# Patient Record
Sex: Male | Born: 1960 | Race: Black or African American | Hispanic: No | Marital: Single | Smoking: Current every day smoker
Health system: Southern US, Community
[De-identification: ages and names within clinical notes are randomized; demographics above are authoritative.]

## PROBLEM LIST (undated history)

## (undated) DIAGNOSIS — K219 Gastro-esophageal reflux disease without esophagitis: Secondary | ICD-10-CM

## (undated) DIAGNOSIS — E785 Hyperlipidemia, unspecified: Secondary | ICD-10-CM

## (undated) DIAGNOSIS — I10 Essential (primary) hypertension: Secondary | ICD-10-CM

## (undated) DIAGNOSIS — J449 Chronic obstructive pulmonary disease, unspecified: Secondary | ICD-10-CM

## (undated) DIAGNOSIS — F17200 Nicotine dependence, unspecified, uncomplicated: Secondary | ICD-10-CM

## (undated) DIAGNOSIS — I219 Acute myocardial infarction, unspecified: Secondary | ICD-10-CM

## (undated) DIAGNOSIS — R0600 Dyspnea, unspecified: Secondary | ICD-10-CM

## (undated) DIAGNOSIS — I2510 Atherosclerotic heart disease of native coronary artery without angina pectoris: Secondary | ICD-10-CM

## (undated) HISTORY — DX: Essential (primary) hypertension: I10

## (undated) HISTORY — DX: Hyperlipidemia, unspecified: E78.5

## (undated) HISTORY — PX: CORONARY STENT PLACEMENT: SHX1402

## (undated) HISTORY — PX: CARDIAC CATHETERIZATION: SHX172

## (undated) HISTORY — DX: Acute myocardial infarction, unspecified: I21.9

## (undated) HISTORY — PX: FOOT SURGERY: SHX648

---

## 2009-10-15 ENCOUNTER — Inpatient Hospital Stay: Payer: Self-pay | Admitting: Internal Medicine

## 2009-12-06 ENCOUNTER — Ambulatory Visit: Payer: Self-pay | Admitting: Cardiology

## 2010-05-22 ENCOUNTER — Inpatient Hospital Stay: Payer: Self-pay | Admitting: Specialist

## 2013-10-20 DIAGNOSIS — F1090 Alcohol use, unspecified, uncomplicated: Secondary | ICD-10-CM | POA: Insufficient documentation

## 2013-10-20 DIAGNOSIS — I10 Essential (primary) hypertension: Secondary | ICD-10-CM | POA: Insufficient documentation

## 2013-10-20 DIAGNOSIS — F1010 Alcohol abuse, uncomplicated: Secondary | ICD-10-CM | POA: Insufficient documentation

## 2013-10-20 DIAGNOSIS — Z720 Tobacco use: Secondary | ICD-10-CM | POA: Insufficient documentation

## 2013-10-20 DIAGNOSIS — F17200 Nicotine dependence, unspecified, uncomplicated: Secondary | ICD-10-CM | POA: Insufficient documentation

## 2017-06-24 DIAGNOSIS — L723 Sebaceous cyst: Secondary | ICD-10-CM | POA: Diagnosis not present

## 2017-06-30 ENCOUNTER — Encounter: Payer: Self-pay | Admitting: *Deleted

## 2017-07-01 ENCOUNTER — Ambulatory Visit: Payer: Self-pay | Admitting: General Surgery

## 2017-07-02 ENCOUNTER — Encounter: Payer: Self-pay | Admitting: *Deleted

## 2017-07-03 ENCOUNTER — Encounter: Payer: Self-pay | Admitting: *Deleted

## 2017-07-15 ENCOUNTER — Ambulatory Visit: Payer: Self-pay | Admitting: General Surgery

## 2017-07-21 ENCOUNTER — Ambulatory Visit: Payer: Medicare HMO | Admitting: General Surgery

## 2017-07-21 ENCOUNTER — Encounter: Payer: Self-pay | Admitting: General Surgery

## 2017-07-21 VITALS — BP 124/68 | HR 76 | Resp 12 | Ht 69.0 in | Wt 125.0 lb

## 2017-07-21 DIAGNOSIS — L729 Follicular cyst of the skin and subcutaneous tissue, unspecified: Secondary | ICD-10-CM

## 2017-07-21 NOTE — Progress Notes (Signed)
Patient ID: Iona CoachManuel C Lisle, male   DOB: 08/19/1960, 56 y.o.   MRN: [id number]  Chief Complaint  Patient presents with  . Other    cyst on back    HPI Iona CoachManuel C Krauss is a 56 y.o. male here today for a evaluation of a cyst on upper back. Patient noticed this area about 5 years ago. The area has drained and he was seen at Urgent Care on 06/24/17 for the draining cyst. He still has a large area in the same spot.  HPI  Past Medical History:  Diagnosis Date  . Heart attack (HCC)   . Hyperlipidemia   . Hypertension     Past Surgical History:  Procedure Laterality Date  . CORONARY STENT PLACEMENT      History reviewed. No pertinent family history.  Social History Social History   Tobacco Use  . Smoking status: Current Every Day Smoker  . Smokeless tobacco: Never Used  Substance Use Topics  . Alcohol use: Yes    Frequency: Never  . Drug use: No    No Known Allergies  Current Outpatient Medications  Medication Sig Dispense Refill  . albuterol (PROVENTIL HFA) 108 (90 Base) MCG/ACT inhaler Inhale into the lungs.    [name]. amLODipine (NORVASC) 10 MG tablet TAKE ONE TABLET BY MOUTH ONCE DAILY    . aspirin EC 81 MG tablet Take 81 mg by mouth daily.    [name]. atorvastatin (LIPITOR) 20 MG tablet Take 20 mg by mouth daily.    . cetirizine (ZYRTEC) 10 MG tablet Take by mouth.    . fluticasone (FLONASE) 50 MCG/ACT nasal spray Place into the nose.    [name]. lisinopril (PRINIVIL,ZESTRIL) 20 MG tablet Take by mouth.    . metoprolol succinate (TOPROL-XL) 50 MG 24 hr tablet Take 50 mg by mouth daily. Take with or immediately following a meal.    . nitroGLYCERIN (NITROSTAT) 0.4 MG SL tablet Place 0.4 mg under the tongue every 5 (five) minutes as needed for chest pain.    . Omega-3 Fatty Acids (FISH OIL) 1200 MG CAPS Take by mouth.    . simvastatin (ZOCOR) 20 MG tablet Take 20 mg by mouth daily.     No current facility-administered medications for this visit.     Review of Systems Review of Systems    Constitutional: Negative.   Respiratory: Negative.   Cardiovascular: Negative.     Blood pressure 124/68, pulse 76, resp. rate 12, height 5\' 9"  (1.753 m), weight 125 lb (56.7 kg).  Physical Exam Physical Exam  Constitutional: He is oriented to person, place, and time. He appears well-developed and well-nourished.  Eyes: Conjunctivae are normal. No scleral icterus.  Neck: Neck supple.  Cardiovascular: Normal rate, regular rhythm and normal heart sounds.  Pulmonary/Chest: Effort normal and breath sounds normal.  Lymphadenopathy:    He has no cervical adenopathy.    He has no axillary adenopathy.  Neurological: He is alert and oriented to person, place, and time.  Skin: Skin is warm and dry.     Psychiatric: He has a normal mood and affect.    Data Reviewed Notes reviewed   Assessment    3 cm cyst that appears to have drained on the left edge with a scab over it. No sign of active infection    Plan    Patient advised to have cyst removed in outpatient surgery. May require mobilization of skin on both sides with intermediate closure. Explained fully and pt is agreeable.  HPI, Physical Exam, Assessment and Plan have been scribed under the direction and in the presence of Kathreen CosierS. G. Corretta Munce, MD  Carron Brazenaryl-Lyn Kennedy, LPN  I have completed the exam and reviewed the above documentation for accuracy and completeness.  I agree with the above.  Museum/gallery conserv[employer]agon [employer] has been used and any errors in dictation or transcription are unintentional.  Scotty Pinder G. Evette CristalSankar, M.D., F.A.C.S.  Gerlene BurdockSANKAR,Kammy Klett G 07/21/2017, 2:06 PM  Patient's [facility]. It is okay for patient to continue an 81 mg aspirin once daily.   Nicholes MangoMichele J. Bailey, CMA

## 2017-07-22 ENCOUNTER — Other Ambulatory Visit: Payer: Self-pay

## 2017-07-22 ENCOUNTER — Telehealth: Payer: Self-pay

## 2017-07-22 ENCOUNTER — Encounter
Admission: RE | Admit: 2017-07-22 | Discharge: 2017-07-22 | Disposition: A | Discharge status: Home / Self Care | Payer: Medicare HMO | Source: Ambulatory Visit | Attending: General Surgery | Admitting: General Surgery

## 2017-07-22 DIAGNOSIS — I10 Essential (primary) hypertension: Secondary | ICD-10-CM | POA: Insufficient documentation

## 2017-07-22 DIAGNOSIS — L729 Follicular cyst of the skin and subcutaneous tissue, unspecified: Secondary | ICD-10-CM | POA: Insufficient documentation

## 2017-07-22 DIAGNOSIS — E785 Hyperlipidemia, unspecified: Secondary | ICD-10-CM | POA: Diagnosis not present

## 2017-07-22 DIAGNOSIS — Z538 Procedure and treatment not carried out for other reasons: Secondary | ICD-10-CM | POA: Diagnosis not present

## 2017-07-22 DIAGNOSIS — F17200 Nicotine dependence, unspecified, uncomplicated: Secondary | ICD-10-CM | POA: Diagnosis not present

## 2017-07-22 DIAGNOSIS — R0600 Dyspnea, unspecified: Secondary | ICD-10-CM | POA: Diagnosis not present

## 2017-07-22 DIAGNOSIS — I491 Atrial premature depolarization: Secondary | ICD-10-CM

## 2017-07-22 DIAGNOSIS — I252 Old myocardial infarction: Secondary | ICD-10-CM | POA: Diagnosis not present

## 2017-07-22 DIAGNOSIS — I447 Left bundle-branch block, unspecified: Secondary | ICD-10-CM | POA: Insufficient documentation

## 2017-07-22 DIAGNOSIS — K219 Gastro-esophageal reflux disease without esophagitis: Secondary | ICD-10-CM | POA: Diagnosis not present

## 2017-07-22 DIAGNOSIS — E876 Hypokalemia: Secondary | ICD-10-CM | POA: Diagnosis not present

## 2017-07-22 DIAGNOSIS — I2510 Atherosclerotic heart disease of native coronary artery without angina pectoris: Secondary | ICD-10-CM | POA: Diagnosis not present

## 2017-07-22 DIAGNOSIS — Z01818 Encounter for other preprocedural examination: Secondary | ICD-10-CM | POA: Insufficient documentation

## 2017-07-22 HISTORY — DX: Dyspnea, unspecified: R06.00

## 2017-07-22 HISTORY — DX: Gastro-esophageal reflux disease without esophagitis: K21.9

## 2017-07-22 HISTORY — DX: Nicotine dependence, unspecified, uncomplicated: F17.200

## 2017-07-22 HISTORY — DX: Atherosclerotic heart disease of native coronary artery without angina pectoris: I25.10

## 2017-07-22 LAB — CBC
HCT: 32.3 % — ABNORMAL LOW (ref 40.0–52.0)
Hemoglobin: 11.1 g/dL — ABNORMAL LOW (ref 13.0–18.0)
MCH: 31.9 pg (ref 26.0–34.0)
MCHC: 34.5 g/dL (ref 32.0–36.0)
MCV: 92.5 fL (ref 80.0–100.0)
PLATELETS: 217 10*3/uL (ref 150–440)
RBC: 3.49 MIL/uL — ABNORMAL LOW (ref 4.40–5.90)
RDW: 13.3 % (ref 11.5–14.5)
WBC: 8.1 10*3/uL (ref 3.8–10.6)

## 2017-07-22 LAB — DIFFERENTIAL
BASOS ABS: 0.1 10*3/uL (ref 0–0.1)
BASOS PCT: 1 %
EOS ABS: 0 10*3/uL (ref 0–0.7)
Eosinophils Relative: 0 %
Lymphocytes Relative: 22 %
Lymphs Abs: 1.8 10*3/uL (ref 1.0–3.6)
MONO ABS: 0.9 10*3/uL (ref 0.2–1.0)
MONOS PCT: 11 %
NEUTROS ABS: 5.3 10*3/uL (ref 1.4–6.5)
Neutrophils Relative %: 66 %

## 2017-07-22 LAB — BASIC METABOLIC PANEL
Anion gap: 13 (ref 5–15)
BUN: 5 mg/dL — AB (ref 6–20)
CALCIUM: 8.7 mg/dL — AB (ref 8.9–10.3)
CO2: 30 mmol/L (ref 22–32)
Chloride: 93 mmol/L — ABNORMAL LOW (ref 101–111)
Creatinine, Ser: 0.59 mg/dL — ABNORMAL LOW (ref 0.61–1.24)
GFR calc Af Amer: >60 mL/min (ref >60)
GLUCOSE: 112 mg/dL — AB (ref 65–99)
POTASSIUM: 2.6 mmol/L — AB (ref 3.5–5.1)
SODIUM: 136 mmol/L (ref 135–145)

## 2017-07-22 NOTE — Pre-Procedure Instructions (Signed)
Dr. Henrene HawkingKephart made aware of pre-op EKG and stated okay for surgery.

## 2017-07-22 NOTE — Patient Instructions (Signed)
Your procedure is scheduled on: July 25, 2017 (Friday ) Report to Same Day Surgery. ([facility] ) SECOND FLOOR To find out your arrival time please call [phone] between 1PM - 3PM on July 24, 2017 (Thursday ).  Remember: Instructions that are not followed completely may result in serious medical risk, up to and including death, or upon the discretion of your surgeon and anesthesiologist your surgery may need to be rescheduled.     _X__ 1. Do not eat food after midnight the night before your procedure.                 No gum chewing or hard candies. You may drink clear liquids up to 2 hours                 before you are scheduled to arrive for your surgery- DO not drink clear                 liquids within 2 hours of the start of your surgery.                 Clear Liquids include:  water, apple juice without pulp, clear carbohydrate                 drink such as Clearfast of Gartorade, Black Coffee or Tea (Do not add                 anything to coffee or tea).     _X__ 2.  No Alcohol for 24 hours before or after surgery.   _X__ 3.  Do Not Smoke or use e-cigarettes For 24 Hours Prior to Your Surgery.                 Do not use any chewable tobacco products for at least 6 hours prior to                 surgery.  ____  4.  Bring all medications with you on the day of surgery if instructed.   _X___  5.  Notify your doctor if there is any change in your medical condition      (cold, fever, infections).     Do not wear jewelry, make-up, hairpins, clips or nail polish. Do not wear lotions, powders, or perfumes.  Do not shave 48 hours prior to surgery. Men may shave face and neck. Do not bring valuables to the hospital.    [facility] is not responsible for any belongings or valuables.  Contacts, dentures or bridgework may not be worn into surgery. Leave your suitcase in the car. After surgery it may be brought to your room. For patients admitted to  the hospital, discharge time is determined by your treatment team.   Patients discharged the day of surgery will not be allowed to drive home.   Please read over the following fact sheets that you were given:             __X__ Take these medicines the morning of surgery with A SIP OF WATER:    1. AMLODIPINE  2. METOPROLOL  3. OMEPRAZOLE  4. OMEPRAZOLE AT BEDTIME ON THURSDAY NIGHT  5.  6.  ____ Fleet Enema (as directed)   __X__ Use CHG Soap as directed  __X__ Use inhalers on the day of surgery (USE PRO AIR INHALER THE MORNING OF SURGERY AND BRING TO HOSPITAL   ____ Stop metformin 2 days prior to surgery  ____ Take 1/2 of usual insulin dose the night before surgery. No insulin the morning          of surgery.   _X___ Stop Coumadin/Plavix/aspirin on (DO NOT TAKE ASPIRIN THE MORNING OF SURGERY )  _X___ Stop Anti-inflammatories on ( NO ASPIRIN PRODUCTS ) TYLENOL OK TO TAKE FOR PAIN IF NEEDED   _X ___ Stop supplements until after surgery. (STOP FISH OIL UNTIL AFTER SURGERY )   ____ Bring C-Pap to the hospital.

## 2017-07-22 NOTE — Telephone Encounter (Signed)
Bernard Harris with [facility] pre admit testing called and said the patient's potassium level was 2.6 today.  Spoke with Dr Evette CristalSankar and received orders to sent in for Potassium 20 MEQ one twice daily until surgery. They will recheck his levels the morning of surgery.  Patient notified and will start this today. Prescription has been sent into his pharmacy.

## 2017-07-22 NOTE — Pre-Procedure Instructions (Signed)
Potassium results faxed and called to Dr. Evette CristalSankar office (spoke to PublixCaryllyn Kennedy ).

## 2017-07-24 MED ORDER — CEFAZOLIN SODIUM-DEXTROSE 2-4 GM/100ML-% IV SOLN: 2.0000 g | INTRAVENOUS | Status: AC

## 2017-07-25 ENCOUNTER — Ambulatory Visit
Admission: RE | Admit: 2017-07-25 | Discharge: 2017-07-25 | Disposition: A | Discharge status: Home / Self Care | Payer: Medicare HMO | Source: Ambulatory Visit | Attending: General Surgery | Admitting: General Surgery

## 2017-07-25 ENCOUNTER — Encounter: Payer: Self-pay | Admitting: *Deleted

## 2017-07-25 ENCOUNTER — Other Ambulatory Visit: Payer: Self-pay | Admitting: *Deleted

## 2017-07-25 ENCOUNTER — Ambulatory Visit: Payer: Medicare HMO | Admitting: Anesthesiology

## 2017-07-25 ENCOUNTER — Other Ambulatory Visit: Payer: Self-pay | Admitting: General Surgery

## 2017-07-25 ENCOUNTER — Encounter
Admission: RE | Disposition: A | Discharge status: Home / Self Care | Payer: Self-pay | Source: Ambulatory Visit | Attending: General Surgery

## 2017-07-25 DIAGNOSIS — I252 Old myocardial infarction: Secondary | ICD-10-CM | POA: Diagnosis not present

## 2017-07-25 DIAGNOSIS — I2510 Atherosclerotic heart disease of native coronary artery without angina pectoris: Secondary | ICD-10-CM | POA: Insufficient documentation

## 2017-07-25 DIAGNOSIS — L729 Follicular cyst of the skin and subcutaneous tissue, unspecified: Secondary | ICD-10-CM | POA: Insufficient documentation

## 2017-07-25 DIAGNOSIS — R0600 Dyspnea, unspecified: Secondary | ICD-10-CM | POA: Diagnosis not present

## 2017-07-25 DIAGNOSIS — Z538 Procedure and treatment not carried out for other reasons: Secondary | ICD-10-CM | POA: Insufficient documentation

## 2017-07-25 DIAGNOSIS — I10 Essential (primary) hypertension: Secondary | ICD-10-CM | POA: Insufficient documentation

## 2017-07-25 DIAGNOSIS — E785 Hyperlipidemia, unspecified: Secondary | ICD-10-CM | POA: Insufficient documentation

## 2017-07-25 DIAGNOSIS — K219 Gastro-esophageal reflux disease without esophagitis: Secondary | ICD-10-CM | POA: Diagnosis not present

## 2017-07-25 DIAGNOSIS — E876 Hypokalemia: Secondary | ICD-10-CM | POA: Insufficient documentation

## 2017-07-25 DIAGNOSIS — F17200 Nicotine dependence, unspecified, uncomplicated: Secondary | ICD-10-CM | POA: Diagnosis not present

## 2017-07-25 LAB — POTASSIUM: POTASSIUM: 2.6 mmol/L — AB (ref 3.5–5.1)

## 2017-07-25 LAB — POCT I-STAT 4, (NA,K, GLUC, HGB,HCT)
GLUCOSE: 107 mg/dL — AB (ref 65–99)
HEMATOCRIT: 31 % — AB (ref 39.0–52.0)
Hemoglobin: 10.5 g/dL — ABNORMAL LOW (ref 13.0–17.0)
Potassium: 2.5 mmol/L — CL (ref 3.5–5.1)
Sodium: 141 mmol/L (ref 135–145)

## 2017-07-25 SURGERY — EXCISION, LESION, BACK
Anesthesia: General

## 2017-07-25 MED ORDER — CEFAZOLIN SODIUM-DEXTROSE 2-4 GM/100ML-% IV SOLN
INTRAVENOUS | Status: AC
  Filled 2017-07-25: qty 100

## 2017-07-25 MED ORDER — LACTATED RINGERS IV SOLN
INTRAVENOUS | Status: DC
  Administered 2017-07-25: 07:00:00 via INTRAVENOUS

## 2017-07-25 MED ORDER — MIDAZOLAM HCL 2 MG/2ML IJ SOLN
INTRAMUSCULAR | Status: AC
  Filled 2017-07-25: qty 2

## 2017-07-25 MED ORDER — ROCURONIUM BROMIDE 50 MG/5ML IV SOLN
INTRAVENOUS | Status: AC
  Filled 2017-07-25: qty 1

## 2017-07-25 MED ORDER — CHLORHEXIDINE GLUCONATE CLOTH 2 % EX PADS: 6.0000 | MEDICATED_PAD | Freq: Once | CUTANEOUS | Status: DC

## 2017-07-25 MED ORDER — SUCCINYLCHOLINE CHLORIDE 20 MG/ML IJ SOLN
INTRAMUSCULAR | Status: AC
  Filled 2017-07-25: qty 1

## 2017-07-25 MED ORDER — LIDOCAINE HCL (PF) 2 % IJ SOLN
INTRAMUSCULAR | Status: AC
  Filled 2017-07-25: qty 10

## 2017-07-25 MED ORDER — FENTANYL CITRATE (PF) 100 MCG/2ML IJ SOLN
INTRAMUSCULAR | Status: AC
  Filled 2017-07-25: qty 2

## 2017-07-25 MED ORDER — PROPOFOL 10 MG/ML IV BOLUS
INTRAVENOUS | Status: AC
  Filled 2017-07-25: qty 20

## 2017-07-25 MED ORDER — BUPIVACAINE HCL (PF) 0.5 % IJ SOLN
INTRAMUSCULAR | Status: AC
  Filled 2017-07-25: qty 30

## 2017-07-25 SURGICAL SUPPLY — 28 items
CANISTER SUCT 1200ML W/VALVE (MISCELLANEOUS) ×3 IMPLANT
CHLORAPREP W/TINT 26ML (MISCELLANEOUS) ×3 IMPLANT
DERMABOND ADVANCED (GAUZE/BANDAGES/DRESSINGS) ×2
DERMABOND ADVANCED .7 DNX12 (GAUZE/BANDAGES/DRESSINGS) ×1 IMPLANT
DRAPE LAPAROTOMY 100X77 ABD (DRAPES) ×3 IMPLANT
DRSG TEGADERM 4X4.75 (GAUZE/BANDAGES/DRESSINGS) ×3 IMPLANT
DRSG TELFA 4X3 1S NADH ST (GAUZE/BANDAGES/DRESSINGS) ×3 IMPLANT
ELECT REM PT RETURN 9FT ADLT (ELECTROSURGICAL) ×3
ELECTRODE REM PT RTRN 9FT ADLT (ELECTROSURGICAL) ×1 IMPLANT
GAUZE SPONGE 4X4 12PLY STRL (GAUZE/BANDAGES/DRESSINGS) ×3 IMPLANT
GLOVE BIO SURGEON STRL SZ7 (GLOVE) ×3 IMPLANT
GOWN STRL REUS W/ TWL LRG LVL3 (GOWN DISPOSABLE) ×2 IMPLANT
GOWN STRL REUS W/TWL LRG LVL3 (GOWN DISPOSABLE) ×4
KIT RM TURNOVER STRD PROC AR (KITS) ×3 IMPLANT
LABEL OR SOLS (LABEL) ×3 IMPLANT
NEEDLE HYPO 22GX1.5 SAFETY (NEEDLE) ×3 IMPLANT
NEEDLE HYPO 25X1 1.5 SAFETY (NEEDLE) ×3 IMPLANT
PACK BASIN MINOR ARMC (MISCELLANEOUS) ×3 IMPLANT
SUT ETHILON 4-0 (SUTURE)
SUT ETHILON 4-0 FS2 18XMFL BLK (SUTURE)
SUT VIC AB 2-0 CT1 (SUTURE) ×3 IMPLANT
SUT VIC AB 3-0 SH 27 (SUTURE) ×2
SUT VIC AB 3-0 SH 27X BRD (SUTURE) ×1 IMPLANT
SUT VIC AB 4-0 FS2 27 (SUTURE) ×3 IMPLANT
SUT VICRYL+ 3-0 144IN (SUTURE) IMPLANT
SUTURE ETHLN 4-0 FS2 18XMF BLK (SUTURE) IMPLANT
SYR BULB IRRIG 60ML STRL (SYRINGE) ×3 IMPLANT
SYR CONTROL 10ML (SYRINGE) ×3 IMPLANT

## 2017-07-25 NOTE — Telephone Encounter (Signed)
patient's potassium level was low. Surgery cancelled.  Spoke with Dr Evette CristalSankar and received orders to sent in for Potassium 20 MEQ one twice daily . Rx called in.

## 2017-07-25 NOTE — Anesthesia Preprocedure Evaluation (Signed)
Anesthesia Evaluation  Patient identified by MRN, date of birth, ID band Patient awake    Reviewed: Allergy & Precautions, NPO status , Patient's Chart, lab work & pertinent test results  History of Anesthesia Complications Negative for: history of anesthetic complications  Airway Mallampati: III  TM Distance: >3 FB Neck ROM: Full    Dental  (+) Poor Dentition   Pulmonary neg COPD, Current Smoker,    breath sounds clear to auscultation- rhonchi (-) wheezing      Cardiovascular hypertension, + CAD and + Past MI  (-) Cardiac Stents and (-) CABG  Rhythm:Regular Rate:Normal - Systolic murmurs and - Diastolic murmurs    Neuro/Psych negative neurological ROS  negative psych ROS   GI/Hepatic Neg liver ROS, GERD  ,  Endo/Other  negative endocrine ROSneg diabetes  Renal/GU negative Renal ROS     Musculoskeletal negative musculoskeletal ROS (+)   Abdominal (+) - obese,   Peds  Hematology negative hematology ROS (+)   Anesthesia Other Findings Past Medical History: No date: Coronary artery disease No date: Current every day smoker No date: Dyspnea     Comment:  with exertion No date: GERD (gastroesophageal reflux disease) No date: Heart attack (HCC)     Comment:  X 2 No date: Hyperlipidemia No date: Hypertension   Reproductive/Obstetrics                             Anesthesia Physical Anesthesia Plan  ASA: III  Anesthesia Plan: General   Post-op Pain Management:    Induction: Intravenous  PONV Risk Score and Plan: 0 and Ondansetron  Airway Management Planned: Oral ETT  Additional Equipment:   Intra-op Plan:   Post-operative Plan: Extubation in OR  Informed Consent: I have reviewed the patients History and Physical, chart, labs and discussed the procedure including the risks, benefits and alternatives for the proposed anesthesia with the patient or authorized representative who  has indicated his/her understanding and acceptance.   Dental advisory given  Plan Discussed with: CRNA and Anesthesiologist  Anesthesia Plan Comments:         Anesthesia Quick Evaluation

## 2017-07-25 NOTE — H&P (Signed)
  Pt had a low K of 2.5 on preadmit testing. He has been taking KCL for last 3 days K this am is still lw-2.6. Cyst on his back is not inflamed  Feel it would be safer to wait and correct his potassium level. Pt advised. Will contact PCP to address this issue. Surgery will be rescheduled. Pt advised.

## 2017-07-25 NOTE — OR Nursing (Signed)
IV discontinued and patient discharged home.  Aware to pick up RX for potassium.

## 2017-07-31 ENCOUNTER — Telehealth: Payer: Self-pay | Admitting: *Deleted

## 2017-07-31 NOTE — Telephone Encounter (Signed)
Patient was contacted today and notified of appointment with Dr. Terance [facility]Bronstein at [facility] in [place] for Monday, 08-04-17 at 9:15 am (arrive 9 am). The patient will need to take his insurance card and photo ID.   The patient verbalizes understanding.   He was again reminded to continue potassium tablets twice daily.

## 2017-07-31 NOTE — Telephone Encounter (Signed)
Patient was contacted today and notified that Dr. Mcarthur RossettiBronstein's office will be in contact directly with him to get him scheduled for an appointment within the next couple of weeks.   Dr. Evette CristalSankar spoke with Dr. Terance HartBronstein today.  Patient is to continue taking potassium chloride 20 meq one tablet twice daily for 2 weeks. The patient states that he just picked up a prescription and has enough to last for 2 weeks out.   Once Dr. Terance HartBronstein has cleared patient for surgery and potassium level is corrected, we will make arrangements for patient to see Dr. Lemar LivingsByrnett and get surgery rescheduled at that time.   Patient verbalizes understanding.

## 2017-08-04 DIAGNOSIS — F1010 Alcohol abuse, uncomplicated: Secondary | ICD-10-CM | POA: Diagnosis not present

## 2017-08-04 DIAGNOSIS — E7800 Pure hypercholesterolemia, unspecified: Secondary | ICD-10-CM | POA: Diagnosis not present

## 2017-08-04 DIAGNOSIS — I10 Essential (primary) hypertension: Secondary | ICD-10-CM | POA: Diagnosis not present

## 2017-08-04 DIAGNOSIS — E876 Hypokalemia: Secondary | ICD-10-CM | POA: Diagnosis not present

## 2017-08-04 DIAGNOSIS — F17210 Nicotine dependence, cigarettes, uncomplicated: Secondary | ICD-10-CM | POA: Diagnosis not present

## 2017-08-04 DIAGNOSIS — E785 Hyperlipidemia, unspecified: Secondary | ICD-10-CM | POA: Diagnosis not present

## 2017-08-04 DIAGNOSIS — Z125 Encounter for screening for malignant neoplasm of prostate: Secondary | ICD-10-CM | POA: Diagnosis not present

## 2017-08-04 DIAGNOSIS — Z720 Tobacco use: Secondary | ICD-10-CM | POA: Diagnosis not present

## 2017-08-11 ENCOUNTER — Encounter: Payer: Self-pay | Admitting: *Deleted

## 2017-08-11 NOTE — Progress Notes (Signed)
We received a note from Dr. Mcarthur RossettiBronstein's office dated 08-08-17 stating that patient's potassium is now normal and we can proceed with surgery.   Patient was contacted today and notified of the above. A pre-op visit has been scheduled for 08-20-17 with Dr. Lemar LivingsByrnett. Surgery date will be arranged at that office visit.

## 2017-08-20 ENCOUNTER — Encounter: Payer: Self-pay | Admitting: General Surgery

## 2017-08-20 ENCOUNTER — Ambulatory Visit (INDEPENDENT_AMBULATORY_CARE_PROVIDER_SITE_OTHER): Payer: Medicare HMO | Admitting: General Surgery

## 2017-08-20 VITALS — BP 118/78 | HR 76 | Resp 14 | Ht 69.0 in | Wt 127.0 lb

## 2017-08-20 DIAGNOSIS — L729 Follicular cyst of the skin and subcutaneous tissue, unspecified: Secondary | ICD-10-CM | POA: Diagnosis not present

## 2017-08-20 NOTE — Progress Notes (Signed)
Patient ID: Bernard Harris, male   DOB: [birth year], 56 y.o.   MRN: [id number]  Chief Complaint  Patient presents with  . Other    HPI Bernard Harris is a 57 y.o. male.  Here today to discuss surgery. He was seen by Dr Evette Cristal for a cyst on upper back. Patient noticed this area about 5 years ago. The area has not drained recently. He was seen at Urgent Care on 06/24/17 for the draining cyst. He states the area is itching now. He states he ran out of his potassium was today.  HPI  Past Medical History:  Diagnosis Date  . Coronary artery disease   . Current every day smoker   . Dyspnea    with exertion  . GERD (gastroesophageal reflux disease)   . Heart attack (HCC)    X 2  . Hyperlipidemia   . Hypertension     Past Surgical History:  Procedure Laterality Date  . CARDIAC CATHETERIZATION    . CORONARY STENT PLACEMENT    . FOOT SURGERY Bilateral 1980's    History reviewed. No pertinent family history.  Social History Social History   Tobacco Use  . Smoking status: Current Every Day Smoker    Packs/day: 0.50    Types: Cigarettes  . Smokeless tobacco: Never Used  Substance Use Topics  . Alcohol use: Yes    Alcohol/week: 1.8 - 3.0 oz    Types: 2 - 3 Cans of beer, 1 - 2 Shots of liquor per week    Frequency: Never    Comment: daily  . Drug use: No    No Known Allergies  Current Outpatient Medications  Medication Sig Dispense Refill  . albuterol (PROVENTIL HFA) 108 (90 Base) MCG/ACT inhaler Inhale 2 puffs into the lungs every 6 (six) hours as needed for wheezing or shortness of breath.     [name] amLODipine (NORVASC) 10 MG tablet Take 10 mg by mouth once daily    . aspirin EC 81 MG tablet Take 81 mg by mouth daily.    . cetirizine (ZYRTEC) 10 MG tablet Take 10 mg by mouth daily.     . fluticasone (FLONASE) 50 MCG/ACT nasal spray Place 2 sprays into both nostrils daily.     [name] lisinopril (PRINIVIL,ZESTRIL) 20 MG tablet Take 20 mg by mouth daily.     . metoprolol  tartrate (LOPRESSOR) 50 MG tablet Take 50 mg by mouth 2 (two) times daily.    . nitroGLYCERIN (NITROSTAT) 0.4 MG SL tablet Place 0.4 mg under the tongue every 5 (five) minutes as needed for chest pain.    . Omega-3 Fatty Acids (FISH OIL) 1200 MG CAPS Take 1,200 mg by mouth daily.     [name] omeprazole (PRILOSEC) 20 MG capsule Take 20 mg by mouth daily.    . simvastatin (ZOCOR) 20 MG tablet Take 20 mg by mouth at bedtime.      No current facility-administered medications for this visit.     Review of Systems Review of Systems  Constitutional: Negative.   Respiratory: Negative.   Cardiovascular: Negative.     Blood pressure 118/78, pulse 76, resp. rate 14, height 5\' 9"  (1.753 m), weight 127 lb (57.6 kg), SpO2 99 %.  Physical Exam Physical Exam  Constitutional: He is oriented to person, place, and time. He appears well-developed and well-nourished.  HENT:  Mouth/Throat: Oropharynx is clear and moist.  Eyes: Conjunctivae are normal.  Neck: Neck supple.  Cardiovascular: Normal rate and normal heart sounds. An  irregular rhythm present.  Pulmonary/Chest: Effort normal and breath sounds normal.      Neurological: He is alert and oriented to person, place, and time.  Skin: Skin is warm and dry.  3 cm skin cyst mid upper back  Psychiatric: His behavior is normal.    Data Reviewed The patient's potassium prior to procedure was 2.5.  On recheck on August 04, 2017 it was 4.2.  The patient completed his potassium supplements today.  He is not on any potassium wasting medications.  Assessment    Noninfected cyst of the left back.    Plan    I think he can be managed with an office procedure.     Schedule excision skin cyst in the office  HPI, Physical Exam, Assessment and Plan have been scribed under the direction and in the presence of Earline MayotteJeffrey W. Yania Bogie, MD. Dorathy DaftMarsha Hatch, RN  I have completed the exam and reviewed the above documentation for accuracy and completeness.  I agree  with the above.  Museum[employer]gallery [employer] has been used and any errors in dictation or transcription are unintentional.  Donnalee CurryJeffrey Kaavya Puskarich, M.D., F.A.C.S.  Merrily PewJeffrey W Jesyca Weisenburger 08/20/2017, 6:39 PM

## 2017-08-20 NOTE — Patient Instructions (Addendum)
The patient is aware to call back for any questions or concerns.  Schedule excision skin cyst in the office

## 2017-08-28 ENCOUNTER — Ambulatory Visit (INDEPENDENT_AMBULATORY_CARE_PROVIDER_SITE_OTHER): Payer: Medicare HMO | Admitting: General Surgery

## 2017-08-28 ENCOUNTER — Encounter: Payer: Self-pay | Admitting: General Surgery

## 2017-08-28 VITALS — BP 120/78 | HR 74 | Resp 14 | Ht 69.0 in | Wt 124.0 lb

## 2017-08-28 DIAGNOSIS — L720 Epidermal cyst: Secondary | ICD-10-CM | POA: Diagnosis not present

## 2017-08-28 DIAGNOSIS — L729 Follicular cyst of the skin and subcutaneous tissue, unspecified: Secondary | ICD-10-CM | POA: Diagnosis not present

## 2017-08-28 NOTE — Patient Instructions (Signed)
Return in one week nurse 

## 2017-08-28 NOTE — Progress Notes (Signed)
Patient ID: Bernard Harris, male   DOB: 03/13/1961, 57 y.o.   MRN: [id number]  Chief Complaint  Patient presents with  . Procedure    HPI [name] is a 57 y.o. male here today for excision back cyst.  HPI  Past Medical History:  Diagnosis Date  . Coronary artery disease   . Current every day smoker   . Dyspnea    with exertion  . GERD (gastroesophageal reflux disease)   . Heart attack (HCC)    X 2  . Hyperlipidemia   . Hypertension     Past Surgical History:  Procedure Laterality Date  . CARDIAC CATHETERIZATION    . CORONARY STENT PLACEMENT    . FOOT SURGERY Bilateral 1980's    No family history on file.  Social History Social History   Tobacco Use  . Smoking status: Current Every Day Smoker    Packs/day: 0.50    Types: Cigarettes  . Smokeless tobacco: Never Used  Substance Use Topics  . Alcohol use: Yes    Alcohol/week: 1.8 - 3.0 oz    Types: 2 - 3 Cans of beer, 1 - 2 Shots of liquor per week    Frequency: Never    Comment: daily  . Drug use: No    No Known Allergies  Current Outpatient Medications  Medication Sig Dispense Refill  . albuterol (PROVENTIL HFA) 108 (90 Base) MCG/ACT inhaler Inhale 2 puffs into the lungs every 6 (six) hours as needed for wheezing or shortness of breath.     [name] amLODipine (NORVASC) 10 MG tablet Take 10 mg by mouth once daily    . aspirin EC 81 MG tablet Take 81 mg by mouth daily.    . cetirizine (ZYRTEC) 10 MG tablet Take 10 mg by mouth daily.     . fluticasone (FLONASE) 50 MCG/ACT nasal spray Place 2 sprays into both nostrils daily.     [name] lisinopril (PRINIVIL,ZESTRIL) 20 MG tablet Take 20 mg by mouth daily.     . metoprolol tartrate (LOPRESSOR) 50 MG tablet Take 50 mg by mouth 2 (two) times daily.    . nitroGLYCERIN (NITROSTAT) 0.4 MG SL tablet Place 0.4 mg under the tongue every 5 (five) minutes as needed for chest pain.    . Omega-3 Fatty Acids (FISH OIL) 1200 MG CAPS Take 1,200 mg by mouth daily.     [name]  omeprazole (PRILOSEC) 20 MG capsule Take 20 mg by mouth daily.    . simvastatin (ZOCOR) 20 MG tablet Take 20 mg by mouth at bedtime.      No current facility-administered medications for this visit.     Review of Systems Review of Systems  Constitutional: Negative.   Respiratory: Negative.   Cardiovascular: Negative.     Blood pressure 120/78, pulse 74, resp. rate 14, height 5\' 9"  (1.753 m), weight 124 lb (56.2 kg).  Physical Exam Physical Exam  Constitutional: He is oriented to person, place, and time. He appears well-developed and well-nourished.  Pulmonary/Chest:      Neurological: He is alert and oriented to person, place, and time.  Skin: Skin is dry.    Data Reviewed The area was cleansed with ChloraPrep followed by 30 cc of 0.5% Xylocaine with 0.25% Marcaine with 1-200,000 units of epinephrine.  The area was recleansed with ChloraPrep and draped.  A curvilinear incision was used to encompass the central pore as well as the draining sinus portion of the sebaceous cyst.  The skin was incised sharply  and the remaining dissection was completed with sharp dissection.  The entire cyst wall was removed.  Good hemostasis was noted.  The 5 cm incision was closed in 2 layers.  The deep tissue was approximated with 3-0 Vicryl simple sutures.  The skin was closed with interrupted 4-0 Prolene simple sutures.  Telfa and Tegaderm dressing applied.  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    Plan    The patient tolerated the excision well.  Tylenol, Advil, Aleve if needed for soreness.  Ice for comfort.   Return in one week sutures removal. HPI, Physical Exam, Assessment and Plan have been scribed under the direction and in the presence of Donnalee CurryJeffrey Hance Caspers, MD.  Ples SpecterJessica Qualls, CMA  I have completed the exam and reviewed the above documentation for accuracy and completeness.  I agree with the above.  Museum/gallery conservatorDragon [employer] has been used and any  errors in dictation or transcription are unintentional.  Donnalee CurryJeffrey Suzette Flagler, M.D., F.A.C.S.  Merrily PewJeffrey W Doni Bacha 08/28/2017, 7:59 PM

## 2017-09-03 ENCOUNTER — Telehealth: Payer: Self-pay | Admitting: *Deleted

## 2017-09-03 NOTE — Telephone Encounter (Signed)
Notified patient as instructed, patient pleased. Discussed follow-up appointments, patient agrees  

## 2017-09-03 NOTE — Telephone Encounter (Signed)
-----   Message from Earline MayotteJeffrey W Byrnett, MD sent at 09/02/2017  2:21 PM EST ----- Please let the patient know the pathology report was fine.  Just a skin cyst as expected. ----- Message ----- From: Interface, Lab In [facility] Sent: 09/01/2017   5:37 PM To: Earline MayotteJeffrey W Byrnett, MD

## 2017-09-04 ENCOUNTER — Ambulatory Visit (INDEPENDENT_AMBULATORY_CARE_PROVIDER_SITE_OTHER): Payer: Medicare HMO | Admitting: *Deleted

## 2017-09-04 DIAGNOSIS — L729 Follicular cyst of the skin and subcutaneous tissue, unspecified: Secondary | ICD-10-CM

## 2017-09-04 NOTE — Patient Instructions (Signed)
Patient to return as needed. 

## 2017-09-04 NOTE — Progress Notes (Signed)
Patient came in today for a wound check. The sutures were removed and steri strips applied. The wound is clean, with no signs of infection noted. Follow up as scheduled.  

## 2017-09-25 DIAGNOSIS — E7800 Pure hypercholesterolemia, unspecified: Secondary | ICD-10-CM | POA: Diagnosis not present

## 2017-09-25 DIAGNOSIS — Z955 Presence of coronary angioplasty implant and graft: Secondary | ICD-10-CM | POA: Diagnosis not present

## 2017-09-25 DIAGNOSIS — I10 Essential (primary) hypertension: Secondary | ICD-10-CM | POA: Diagnosis not present

## 2017-09-25 DIAGNOSIS — Z720 Tobacco use: Secondary | ICD-10-CM | POA: Diagnosis not present

## 2017-09-25 DIAGNOSIS — I214 Non-ST elevation (NSTEMI) myocardial infarction: Secondary | ICD-10-CM | POA: Diagnosis not present

## 2017-09-29 DIAGNOSIS — E785 Hyperlipidemia, unspecified: Secondary | ICD-10-CM | POA: Diagnosis not present

## 2017-09-29 DIAGNOSIS — R748 Abnormal levels of other serum enzymes: Secondary | ICD-10-CM | POA: Diagnosis not present

## 2017-09-29 DIAGNOSIS — F1010 Alcohol abuse, uncomplicated: Secondary | ICD-10-CM | POA: Diagnosis not present

## 2017-09-29 DIAGNOSIS — I10 Essential (primary) hypertension: Secondary | ICD-10-CM | POA: Diagnosis not present

## 2017-09-30 DIAGNOSIS — E785 Hyperlipidemia, unspecified: Secondary | ICD-10-CM | POA: Insufficient documentation

## 2018-03-12 DIAGNOSIS — I214 Non-ST elevation (NSTEMI) myocardial infarction: Secondary | ICD-10-CM | POA: Diagnosis not present

## 2018-03-12 DIAGNOSIS — F1010 Alcohol abuse, uncomplicated: Secondary | ICD-10-CM | POA: Diagnosis not present

## 2018-03-12 DIAGNOSIS — Z720 Tobacco use: Secondary | ICD-10-CM | POA: Diagnosis not present

## 2018-03-12 DIAGNOSIS — E785 Hyperlipidemia, unspecified: Secondary | ICD-10-CM | POA: Diagnosis not present

## 2018-03-12 DIAGNOSIS — Z955 Presence of coronary angioplasty implant and graft: Secondary | ICD-10-CM | POA: Diagnosis not present

## 2018-03-12 DIAGNOSIS — I10 Essential (primary) hypertension: Secondary | ICD-10-CM | POA: Diagnosis not present

## 2018-08-20 ENCOUNTER — Emergency Department
Admission: EM | Admit: 2018-08-20 | Discharge: 2018-08-20 | Discharge status: Against Medical Advice | Payer: Medicare HMO

## 2018-08-20 DIAGNOSIS — I10 Essential (primary) hypertension: Secondary | ICD-10-CM | POA: Diagnosis not present

## 2018-08-20 DIAGNOSIS — R52 Pain, unspecified: Secondary | ICD-10-CM | POA: Diagnosis not present

## 2018-08-20 DIAGNOSIS — R0902 Hypoxemia: Secondary | ICD-10-CM | POA: Diagnosis not present

## 2018-08-20 DIAGNOSIS — F10929 Alcohol use, unspecified with intoxication, unspecified: Secondary | ICD-10-CM | POA: Diagnosis not present
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
## 2018-09-29 DIAGNOSIS — Z720 Tobacco use: Secondary | ICD-10-CM | POA: Diagnosis not present

## 2018-09-29 DIAGNOSIS — I10 Essential (primary) hypertension: Secondary | ICD-10-CM | POA: Diagnosis not present

## 2018-09-29 DIAGNOSIS — E785 Hyperlipidemia, unspecified: Secondary | ICD-10-CM | POA: Diagnosis not present

## 2018-09-29 DIAGNOSIS — E7800 Pure hypercholesterolemia, unspecified: Secondary | ICD-10-CM | POA: Diagnosis not present

## 2018-09-29 DIAGNOSIS — Z955 Presence of coronary angioplasty implant and graft: Secondary | ICD-10-CM | POA: Diagnosis not present

## 2019-08-02 ENCOUNTER — Telehealth: Payer: Self-pay | Admitting: *Deleted

## 2019-08-02 NOTE — Telephone Encounter (Signed)
Attempted contact and schedule lung screening scan. No answer or voicemail option available.

## 2019-08-10 ENCOUNTER — Telehealth: Payer: Self-pay | Admitting: *Deleted

## 2019-08-10 NOTE — Telephone Encounter (Signed)
Attempted to contact to schedule lung screening scan. There is no answer or voicemail option.

## 2019-08-24 ENCOUNTER — Encounter: Payer: Self-pay | Admitting: *Deleted

## 2019-09-03 ENCOUNTER — Telehealth: Payer: Self-pay | Admitting: *Deleted

## 2019-09-03 DIAGNOSIS — Z87891 Personal history of nicotine dependence: Secondary | ICD-10-CM

## 2019-09-03 NOTE — Telephone Encounter (Signed)
Received referral for initial lung cancer screening scan. Contacted patient and obtained smoking history,(current, 48 pack year) as well as answering questions related to screening process. Patient denies signs of lung cancer such as weight loss or hemoptysis. Patient denies comorbidity that would prevent curative treatment if lung cancer were found. Patient is scheduled for shared decision making visit and CT scan on 09/08/19 at 1030am.

## 2019-09-08 ENCOUNTER — Ambulatory Visit
Admission: RE | Admit: 2019-09-08 | Discharge: 2019-09-08 | Disposition: A | Discharge status: Home / Self Care | Payer: Medicare Other | Source: Ambulatory Visit | Attending: Oncology | Admitting: Oncology

## 2019-09-08 ENCOUNTER — Other Ambulatory Visit: Payer: Self-pay

## 2019-09-08 ENCOUNTER — Inpatient Hospital Stay: Payer: Medicare Other | Attending: Oncology | Admitting: Oncology

## 2019-09-08 ENCOUNTER — Encounter: Payer: Self-pay | Admitting: Oncology

## 2019-09-08 DIAGNOSIS — Z87891 Personal history of nicotine dependence: Secondary | ICD-10-CM

## 2019-09-08 NOTE — Progress Notes (Signed)
Virtual Visit via Video Note  I connected with Mr. Claw on 09/08/19 at 10:30 AM EST by a video enabled telemedicine application and verified that I am speaking with the correct person using two identifiers.  Location: Patient: [facility] Provider: Office   I discussed the limitations of evaluation and management by telemedicine and the availability of in person appointments. The patient expressed understanding and agreed to proceed.  I discussed the assessment and treatment plan with the patient. The patient was provided an opportunity to ask questions and all were answered. The patient agreed with the plan and demonstrated an understanding of the instructions.   The patient was advised to call back or seek an in-person evaluation if the symptoms worsen or if the condition fails to improve as anticipated.   In accordance with CMS guidelines, patient has met eligibility criteria including age, absence of signs or symptoms of lung cancer.  Social History   Tobacco Use  . Smoking status: Current Every Day Smoker    Packs/day: 1.00    Years: 48.00    Pack years: 48.00    Types: Cigarettes  . Smokeless tobacco: Never Used  Substance Use Topics  . Alcohol use: Yes    Alcohol/week: 3.0 - 5.0 standard drinks    Types: 2 - 3 Cans of beer, 1 - 2 Shots of liquor per week    Comment: daily  . Drug use: No      A shared decision-making session was conducted prior to the performance of CT scan. This includes one or more decision aids, includes benefits and harms of screening, follow-up diagnostic testing, over-diagnosis, false positive rate, and total radiation exposure.   Counseling on the importance of adherence to annual lung cancer LDCT screening, impact of co-morbidities, and ability or willingness to undergo diagnosis and treatment is imperative for compliance of the program.   Counseling on the importance of continued smoking cessation for former smokers; the importance of smoking  cessation for current smokers, and information about tobacco cessation interventions have been given to patient including [place] and [employer] quit [place] programs.   Written order for lung cancer screening with LDCT has been given to the patient and any and all questions have been answered to the best of my abilities.    Yearly follow up will be coordinated by Burgess Estelle, Thoracic Navigator.  I provided 15 minutes of face-to-face video visit time during this encounter, and > 50% was spent counseling as documented under my assessment & plan.   Jacquelin Hawking, NP

## 2019-09-10 ENCOUNTER — Encounter: Payer: Self-pay | Admitting: *Deleted

## 2019-12-30 ENCOUNTER — Ambulatory Visit: Payer: Medicare Other

## 2019-12-30 ENCOUNTER — Ambulatory Visit: Payer: Medicare Other | Admitting: Podiatry

## 2020-01-14 ENCOUNTER — Ambulatory Visit: Payer: Medicare Other | Admitting: Podiatry

## 2020-02-24 ENCOUNTER — Other Ambulatory Visit: Admission: RE | Admit: 2020-02-24 | Payer: Medicare Other | Source: Ambulatory Visit

## 2020-02-28 ENCOUNTER — Ambulatory Visit: Admission: RE | Admit: 2020-02-28 | Payer: Medicare Other | Source: Home / Self Care | Admitting: Internal Medicine

## 2020-02-28 ENCOUNTER — Encounter: Admission: RE | Payer: Self-pay | Source: Home / Self Care

## 2020-02-28 SURGERY — COLONOSCOPY WITH PROPOFOL
Anesthesia: General

## 2020-04-17 ENCOUNTER — Other Ambulatory Visit: Payer: Medicare Other | Attending: Internal Medicine

## 2020-04-19 ENCOUNTER — Encounter: Admission: RE | Payer: Self-pay | Source: Home / Self Care

## 2020-04-19 ENCOUNTER — Ambulatory Visit: Admission: RE | Admit: 2020-04-19 | Payer: Medicare Other | Source: Home / Self Care | Admitting: Internal Medicine

## 2020-04-19 SURGERY — COLONOSCOPY WITH PROPOFOL
Anesthesia: General

## 2020-09-07 ENCOUNTER — Telehealth: Payer: Self-pay | Admitting: *Deleted

## 2020-09-07 NOTE — Telephone Encounter (Signed)
Attempted to contact patient for lung screening scan scheduling. There is no answer. Voice mail box is not set up. Shawn, Please contact patient at a later date.

## 2020-09-12 ENCOUNTER — Telehealth: Payer: Self-pay | Admitting: *Deleted

## 2020-09-12 DIAGNOSIS — Z122 Encounter for screening for malignant neoplasm of respiratory organs: Secondary | ICD-10-CM

## 2020-09-12 DIAGNOSIS — F17200 Nicotine dependence, unspecified, uncomplicated: Secondary | ICD-10-CM

## 2020-09-12 DIAGNOSIS — Z87891 Personal history of nicotine dependence: Secondary | ICD-10-CM

## 2020-09-12 NOTE — Telephone Encounter (Signed)
Contacted and scheduled for annual lung screening scan. Patient is a current smoker with a 48.1 pack year history.

## 2020-09-19 ENCOUNTER — Ambulatory Visit: Admission: RE | Admit: 2020-09-19 | Payer: Medicare Other | Source: Ambulatory Visit

## 2020-09-20 ENCOUNTER — Other Ambulatory Visit: Payer: Self-pay

## 2020-09-20 ENCOUNTER — Ambulatory Visit
Admission: RE | Admit: 2020-09-20 | Discharge: 2020-09-20 | Disposition: A | Discharge status: Home / Self Care | Payer: Medicare Other | Source: Ambulatory Visit | Attending: Nurse Practitioner | Admitting: Nurse Practitioner

## 2020-09-20 DIAGNOSIS — Z87891 Personal history of nicotine dependence: Secondary | ICD-10-CM | POA: Insufficient documentation

## 2020-09-20 DIAGNOSIS — Z122 Encounter for screening for malignant neoplasm of respiratory organs: Secondary | ICD-10-CM | POA: Diagnosis present

## 2020-09-20 DIAGNOSIS — F17200 Nicotine dependence, unspecified, uncomplicated: Secondary | ICD-10-CM | POA: Diagnosis present

## 2020-09-22 ENCOUNTER — Encounter: Payer: Self-pay | Admitting: *Deleted

## 2021-01-26 ENCOUNTER — Encounter: Payer: Self-pay | Admitting: Internal Medicine

## 2021-01-29 ENCOUNTER — Encounter: Admission: RE | Payer: Self-pay | Source: Home / Self Care

## 2021-01-29 ENCOUNTER — Ambulatory Visit: Admission: RE | Admit: 2021-01-29 | Payer: Medicare Other | Source: Home / Self Care | Admitting: Internal Medicine

## 2021-01-29 HISTORY — DX: Chronic obstructive pulmonary disease, unspecified: J44.9

## 2021-01-29 SURGERY — COLONOSCOPY WITH PROPOFOL
Anesthesia: General

## 2021-01-29 NOTE — H&P (Signed)
Outpatient short stay form Pre-procedure 01/29/2021 2:41 PM Bernard Harris Bernard Harris, M.D.  Primary Physician: Bernard Harris, M.D.  Reason for visit:  Personal history of adenomatous colon polyps  History of present illness:                            Patient presents for colonoscopy for a personal hx of colon polyps. The patient denies abdominal pain, abnormal weight loss or rectal bleeding.     No current facility-administered medications for this encounter.  Current Outpatient Medications:    acamprosate (CAMPRAL) 333 MG tablet, Take 666 mg by mouth 3 (three) times daily with meals., Disp: , Rfl:    magnesium oxide (MAG-OX) 400 MG tablet, Take 400 mg by mouth daily., Disp: , Rfl:    albuterol (PROVENTIL HFA) 108 (90 Base) MCG/ACT inhaler, Inhale 2 puffs into the lungs every 6 (six) hours as needed for wheezing or shortness of breath. , Disp: , Rfl:    amLODipine (NORVASC) 10 MG tablet, 5 mg., Disp: , Rfl:    aspirin EC 81 MG tablet, Take 81 mg by mouth daily., Disp: , Rfl:    cetirizine (ZYRTEC) 10 MG tablet, Take 10 mg by mouth daily. , Disp: , Rfl:    cyanocobalamin 1000 MCG tablet, Take by mouth., Disp: , Rfl:    fluticasone (FLONASE) 50 MCG/ACT nasal spray, Place 2 sprays into both nostrils daily. , Disp: , Rfl:    lisinopril (PRINIVIL,ZESTRIL) 20 MG tablet, Take 20 mg by mouth daily. , Disp: , Rfl:    metoprolol tartrate (LOPRESSOR) 50 MG tablet, Take 50 mg by mouth 2 (two) times daily., Disp: , Rfl:    nitroGLYCERIN (NITROSTAT) 0.4 MG SL tablet, Place 0.4 mg under the tongue every 5 (five) minutes as needed for chest pain., Disp: , Rfl:    Omega-3 Fatty Acids (FISH OIL) 1200 MG CAPS, Take 1,200 mg by mouth daily. , Disp: , Rfl:    omeprazole (PRILOSEC) 20 MG capsule, Take 20 mg by mouth daily., Disp: , Rfl:    simvastatin (ZOCOR) 20 MG tablet, Take 20 mg by mouth at bedtime. , Disp: , Rfl:   No medications prior to admission.     No Known Allergies   Past Medical History:   Diagnosis Date   COPD (chronic obstructive pulmonary disease) (HCC)    Coronary artery disease    Current every day smoker    Dyspnea    with exertion   GERD (gastroesophageal reflux disease)    Heart attack (HCC)    X 2   Hyperlipidemia    Hypertension     Review of systems:  Otherwise negative.    Physical Exam  Gen: Alert, oriented. Appears stated age.  HEENT: [place]/AT. PERRLA. Lungs: CTA, no wheezes. CV: RR nl S1, S2. Abd: soft, benign, no masses. BS+ Ext: No edema. Pulses 2+    Planned procedures: Proceed with colonoscopy. The patient understands the nature of the planned procedure, indications, risks, alternatives and potential complications including but not limited to bleeding, infection, perforation, damage to internal organs and possible oversedation/side effects from anesthesia. The patient agrees and gives consent to proceed.  Please refer to procedure notes for findings, recommendations and patient disposition/instructions.     Bernard Harris Bernard Harris, M.D. Gastroenterology 01/29/2021  2:41 PM

## 2021-02-12 ENCOUNTER — Other Ambulatory Visit: Payer: Self-pay

## 2021-02-12 ENCOUNTER — Emergency Department
Admission: EM | Admit: 2021-02-12 | Discharge: 2021-02-13 | Disposition: A | Discharge status: Home / Self Care | Payer: Medicare Other | Attending: Emergency Medicine | Admitting: Emergency Medicine

## 2021-02-12 DIAGNOSIS — Z7952 Long term (current) use of systemic steroids: Secondary | ICD-10-CM | POA: Insufficient documentation

## 2021-02-12 DIAGNOSIS — R220 Localized swelling, mass and lump, head: Secondary | ICD-10-CM | POA: Diagnosis present

## 2021-02-12 DIAGNOSIS — Z7982 Long term (current) use of aspirin: Secondary | ICD-10-CM | POA: Insufficient documentation

## 2021-02-12 DIAGNOSIS — I119 Hypertensive heart disease without heart failure: Secondary | ICD-10-CM | POA: Insufficient documentation

## 2021-02-12 DIAGNOSIS — Z79899 Other long term (current) drug therapy: Secondary | ICD-10-CM | POA: Insufficient documentation

## 2021-02-12 DIAGNOSIS — L0201 Cutaneous abscess of face: Secondary | ICD-10-CM | POA: Diagnosis not present

## 2021-02-12 DIAGNOSIS — F17210 Nicotine dependence, cigarettes, uncomplicated: Secondary | ICD-10-CM | POA: Insufficient documentation

## 2021-02-12 DIAGNOSIS — J449 Chronic obstructive pulmonary disease, unspecified: Secondary | ICD-10-CM | POA: Insufficient documentation

## 2021-02-12 DIAGNOSIS — I2510 Atherosclerotic heart disease of native coronary artery without angina pectoris: Secondary | ICD-10-CM | POA: Diagnosis not present

## 2021-02-12 LAB — COMPREHENSIVE METABOLIC PANEL
ALT: 13 U/L (ref 0–44)
AST: 22 U/L (ref 15–41)
Albumin: 4.1 g/dL (ref 3.5–5.0)
Alkaline Phosphatase: 86 U/L (ref 38–126)
Anion gap: 13 (ref 5–15)
BUN: 25 mg/dL — ABNORMAL HIGH (ref 8–23)
CO2: 21 mmol/L — ABNORMAL LOW (ref 22–32)
Calcium: 9.4 mg/dL (ref 8.9–10.3)
Chloride: 93 mmol/L — ABNORMAL LOW (ref 98–111)
Creatinine, Ser: 1.1 mg/dL (ref 0.61–1.24)
GFR, Estimated: >60 mL/min (ref >60)
Glucose, Bld: 121 mg/dL — ABNORMAL HIGH (ref 70–99)
Potassium: 4.7 mmol/L (ref 3.5–5.1)
Sodium: 127 mmol/L — ABNORMAL LOW (ref 135–145)
Total Bilirubin: 1.2 mg/dL (ref 0.3–1.2)
Total Protein: 10.1 g/dL — ABNORMAL HIGH (ref 6.5–8.1)

## 2021-02-12 LAB — CBC
HCT: 39.3 % (ref 39.0–52.0)
Hemoglobin: 13.6 g/dL (ref 13.0–17.0)
MCH: 30.6 pg (ref 26.0–34.0)
MCHC: 34.6 g/dL (ref 30.0–36.0)
MCV: 88.3 fL (ref 80.0–100.0)
Platelets: 319 10*3/uL (ref 150–400)
RBC: 4.45 MIL/uL (ref 4.22–5.81)
RDW: 13.2 % (ref 11.5–15.5)
WBC: 9.9 10*3/uL (ref 4.0–10.5)
nRBC: 0 % (ref 0.0–0.2)

## 2021-02-12 MED ORDER — FENTANYL CITRATE (PF) 100 MCG/2ML IJ SOLN
50.0000 ug | Freq: Once | INTRAMUSCULAR | Status: AC
  Administered 2021-02-12: 50 ug via INTRAVENOUS
  Filled 2021-02-12: qty 2

## 2021-02-12 MED ORDER — CLINDAMYCIN PHOSPHATE 900 MG/50ML IV SOLN
900.0000 mg | Freq: Once | INTRAVENOUS | Status: AC
  Administered 2021-02-12: 900 mg via INTRAVENOUS
  Filled 2021-02-12: qty 50

## 2021-02-12 NOTE — ED Triage Notes (Addendum)
Pt brought over via [facility] with c/o abscess of paratid gland to left side of face. Per MD it appears infected and pt will need likely admission.

## 2021-02-12 NOTE — ED Triage Notes (Signed)
Pt reports swelling to the left side of his face intermittently for a month, pt thinks he may have an infected hair bump. Pt has a large amount of swelling to the left cheek, denies difficulty swallowing or sensation that there is swelling under his chin

## 2021-02-13 MED ORDER — OXYCODONE-ACETAMINOPHEN 5-325 MG PO TABS: 1.0000 | ORAL_TABLET | ORAL | 0 refills | Status: DC | PRN

## 2021-02-13 MED ORDER — CLINDAMYCIN HCL 300 MG PO CAPS: 300.0000 mg | ORAL_CAPSULE | Freq: Three times a day (TID) | ORAL | 0 refills | Status: AC

## 2021-02-13 NOTE — ED Provider Notes (Signed)
[facility] Emergency Department Provider Note  ____________________________________________  Time seen: Approximately 12:43 AM  I have reviewed the triage vital signs and the nursing notes.   HISTORY  Chief Complaint Abscess   HPI Bernard Harris is a 60 y.o. male with history of COPD, hypertension, hyperlipidemia, smoking, alcohol abuse who presents for evaluation of facial swelling.  Patient reports that he has had some swelling of the left side of his face for about a month but over the last 2 weeks he has gotten progressively worse.  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 Authorizing Provider  clindamycin (CLEOCIN) 300 MG capsule Take 1 capsule (300 mg total) by mouth 3 (three) times daily for 10 days. 02/13/21 02/23/21 Yes Havanna Groner, [place], MD  oxyCODONE-acetaminophen (PERCOCET) 5-325 MG tablet Take 1 tablet by mouth every 4 (four) hours as needed. 02/13/21  Yes Don Perking, [place], MD  acamprosate (CAMPRAL) 333 MG tablet Take 666 mg by mouth 3 (three) times daily with meals.    [provider]  albuterol (PROVENTIL HFA) 108 (90 Base) MCG/ACT inhaler Inhale 2 puffs into the  lungs every 6 (six) hours as needed for wheezing or shortness of breath.     [provider]  amLODipine (NORVASC) 10 MG tablet 5 mg. 02/28/17   [provider]  aspirin EC 81 MG tablet Take 81 mg by mouth daily.    [provider]  cetirizine (ZYRTEC) 10 MG tablet Take 10 mg by mouth daily.     [provider]  cyanocobalamin 1000 MCG tablet Take by mouth.    [provider]  fluticasone (FLONASE) 50 MCG/ACT nasal spray Place 2 sprays into both nostrils daily.     [provider]  lisinopril (PRINIVIL,ZESTRIL) 20 MG tablet Take 20 mg by mouth daily.  01/29/16   [provider]  magnesium oxide (MAG-OX) 400 MG tablet Take 400 mg by mouth daily.    [provider]  metoprolol tartrate (LOPRESSOR) 50 MG tablet Take 50 mg by mouth 2 (two) times daily.    [provider]  nitroGLYCERIN (NITROSTAT) 0.4 MG SL tablet Place 0.4 mg under the tongue every 5 (five) minutes as needed for chest pain.    [provider]  Omega-3 Fatty Acids (FISH OIL) 1200 MG CAPS Take 1,200 mg by mouth daily.     [provider]  omeprazole (PRILOSEC) 20 MG capsule Take 20 mg by mouth daily.    [provider]  simvastatin (ZOCOR) 20 MG tablet Take 20 mg by mouth at bedtime.     [provider]  Allergies Patient has no known allergies.  No family history on file.  Social History Social History   Tobacco Use   Smoking status: Every Day    Packs/day: 1.00    Years: 48.00    Pack years: 48.00    Types: Cigarettes   Smokeless tobacco: Never  Vaping Use   Vaping Use: Never used  Substance Use Topics   Alcohol use: Yes    Alcohol/week: 3.0 - 5.0 standard drinks    Types: 2 - 3 Cans of beer, 1 - 2 Shots of liquor per week    Comment: daily   Drug use: No    Review of Systems  Constitutional: Negative for fever. Eyes: Negative for visual changes. ENT: Negative for sore throat. + L sided facial  swelling Neck: No neck pain  Cardiovascular: Negative for chest pain. Respiratory: Negative for shortness of breath. Gastrointestinal: Negative for abdominal pain, vomiting or diarrhea. Genitourinary: Negative for dysuria. Musculoskeletal: Negative for back pain. Skin: Negative for rash. Neurological: Negative for headaches, weakness or numbness. Psych: No SI or HI  ____________________________________________   PHYSICAL EXAM:  VITAL SIGNS: ED Triage Vitals  Enc Vitals Group     BP 02/12/21 1736 (!) 130/96     Pulse Rate 02/12/21 1736 99     Resp 02/12/21 1736 16     Temp 02/12/21 1736 98.5 F (36.9 C)     Temp Source 02/12/21 1736 Oral     SpO2 02/12/21 1736 100 %     Weight 02/12/21 1737 102 lb (46.3 kg)     Height 02/12/21 1737 5\' 9"  (1.753 m)     Head Circumference --      Peak Flow --      Pain Score 02/12/21 1736 4     Pain Loc --      Pain Edu? --      Excl. in GC? --     Constitutional: Alert and oriented. Well appearing and in no apparent distress. HEENT:      Head: Normocephalic and atraumatic.        Face: Large abscess on the L cheek draining copious amount of pus with overlying skin induration, warmth, and erythema       Eyes: Conjunctivae are normal. Sclera is non-icteric.       Mouth/Throat: Mucous membranes are moist. Normal parotid gland with no purulent discharge      Neck: Supple with no signs of meningismus. Cardiovascular: Regular rate and rhythm.  Respiratory: Normal respiratory effort.  Musculoskeletal:  No edema, cyanosis, or erythema of extremities. Neurologic: Normal speech and language. Face is symmetric. Moving all extremities. No gross focal neurologic deficits are appreciated. Skin: Skin is warm, dry and intact. No rash noted. Psychiatric: Mood and affect are normal. Speech and behavior are normal.  ____________________________________________   LABS (all labs ordered are listed, but only abnormal results are displayed)  Labs  Reviewed  COMPREHENSIVE METABOLIC PANEL - Abnormal; Notable for the following components:      Result Value   Sodium 127 (*)    Chloride 93 (*)    CO2 21 (*)    Glucose, Bld 121 (*)    BUN 25 (*)    Total Protein 10.1 (*)    All other components within normal limits  CBC   ____________________________________________  EKG  none  ____________________________________________  RADIOLOGY  none  ____________________________________________   PROCEDURES  Procedure(s) performed:yes  .09/11/22Incision and Drainage  Date/Time: 02/13/2021 12:45 AM Performed by: 04/16/2021, MD  Authorized by: Nita Sickle, MD   Consent:    Consent obtained:  Verbal   Consent given by:  Patient   Risks discussed:  Bleeding, incomplete drainage, infection and pain   Alternatives discussed:  Alternative treatment Location:    Type:  Abscess   Size:  8   Location:  Head   Head location:  Face Procedure type:    Complexity:  Simple Procedure details:    Drainage:  Purulent   Drainage amount:  Copious   Wound treatment:  Wound left open Post-procedure details:    Procedure completion:  Tolerated well, no immediate complications Critical Care performed:  None ____________________________________________   INITIAL IMPRESSION / ASSESSMENT AND PLAN / ED COURSE  60 y.o. male with history of COPD, hypertension, hyperlipidemia, smoking, alcohol abuse who presents for evaluation of facial swelling.  Patient with a large left cheek abscess.  During my exam I apply minimal pressure to the area and the middle of it opened and copious amount of pus was drained.  Position concerning for a skin abscess with overlying cellulitis.  Patient was given a dose of IV clindamycin.  Discussed wound care at home with heat application for continuous drainage and antibiotics for 10 days.  Discussed my standard return precautions for fever, worsening swelling, changes in vision.  History gathered from patient and  his sister was at bedside.  Plan discussed with both of them.  Old medical records reviewed including note from urgent care with concerns of abscess of the parotid gland.  The abscess is actually in the skin coming from facial hair.       _____________________________________________ Please note:  Patient was evaluated in Emergency Department today for the symptoms described in the history of present illness. Patient was evaluated in the context of the global COVID-19 pandemic, which necessitated consideration that the patient might be at risk for infection with the SARS-CoV-2 virus that causes COVID-19. Institutional protocols and algorithms that pertain to the evaluation of patients at risk for COVID-19 are in a state of rapid change based on information released by regulatory bodies including the CDC and federal and state organizations. These policies and algorithms were followed during the patient's care in the ED.  Some ED evaluations and interventions may be delayed as a result of limited staffing during the pandemic.   [place] Controlled Substance Database was reviewed by me. ____________________________________________   FINAL CLINICAL IMPRESSION(S) / ED DIAGNOSES   Final diagnoses:  Facial abscess      NEW MEDICATIONS STARTED DURING THIS VISIT:  ED Discharge Orders          Ordered    clindamycin (CLEOCIN) 300 MG capsule  3 times daily        02/13/21 0040    oxyCODONE-acetaminophen (PERCOCET) 5-325 MG tablet  Every 4 hours PRN        02/13/21 0040             Note:  This document was prepared using Dragon voice recognition software and may include unintentional dictation errors.    Don Perking, [place], MD 02/13/21 [phone]

## 2021-02-13 NOTE — Discharge Instructions (Addendum)
Apply heat to the swollen area and massage it to continue to drain pus several times a day.  Take the antibiotics fully as prescribed.  Return to the emergency room for worsening swelling, fever, visual changes.  Take Percocet as needed for pain.

## 2021-09-18 ENCOUNTER — Other Ambulatory Visit: Payer: Self-pay | Admitting: *Deleted

## 2021-09-18 DIAGNOSIS — F17210 Nicotine dependence, cigarettes, uncomplicated: Secondary | ICD-10-CM

## 2021-09-18 DIAGNOSIS — Z87891 Personal history of nicotine dependence: Secondary | ICD-10-CM

## 2021-10-03 ENCOUNTER — Ambulatory Visit
Admission: RE | Admit: 2021-10-03 | Discharge: 2021-10-03 | Disposition: A | Discharge status: Home / Self Care | Payer: Medicare Other | Source: Ambulatory Visit | Attending: Acute Care | Admitting: Acute Care

## 2021-10-03 ENCOUNTER — Other Ambulatory Visit: Payer: Self-pay

## 2021-10-03 DIAGNOSIS — Z87891 Personal history of nicotine dependence: Secondary | ICD-10-CM | POA: Insufficient documentation

## 2021-10-03 DIAGNOSIS — F17210 Nicotine dependence, cigarettes, uncomplicated: Secondary | ICD-10-CM | POA: Diagnosis not present

## 2021-10-05 ENCOUNTER — Other Ambulatory Visit: Payer: Self-pay

## 2021-10-05 DIAGNOSIS — Z87891 Personal history of nicotine dependence: Secondary | ICD-10-CM

## 2021-10-05 DIAGNOSIS — F17210 Nicotine dependence, cigarettes, uncomplicated: Secondary | ICD-10-CM

## 2022-01-08 ENCOUNTER — Encounter: Payer: Self-pay | Admitting: Internal Medicine

## 2022-01-09 ENCOUNTER — Other Ambulatory Visit: Payer: Self-pay

## 2022-01-09 ENCOUNTER — Ambulatory Visit: Payer: Medicare Other | Admitting: Anesthesiology

## 2022-01-09 ENCOUNTER — Ambulatory Visit
Admission: RE | Admit: 2022-01-09 | Discharge: 2022-01-09 | Disposition: A | Discharge status: Home / Self Care | Payer: Medicare Other | Attending: Internal Medicine | Admitting: Internal Medicine

## 2022-01-09 ENCOUNTER — Encounter: Payer: Self-pay | Admitting: Internal Medicine

## 2022-01-09 ENCOUNTER — Encounter
Admission: RE | Disposition: A | Discharge status: Home / Self Care | Payer: Self-pay | Source: Home / Self Care | Attending: Internal Medicine

## 2022-01-09 DIAGNOSIS — J449 Chronic obstructive pulmonary disease, unspecified: Secondary | ICD-10-CM | POA: Insufficient documentation

## 2022-01-09 DIAGNOSIS — I10 Essential (primary) hypertension: Secondary | ICD-10-CM | POA: Diagnosis not present

## 2022-01-09 DIAGNOSIS — K621 Rectal polyp: Secondary | ICD-10-CM | POA: Insufficient documentation

## 2022-01-09 DIAGNOSIS — I252 Old myocardial infarction: Secondary | ICD-10-CM | POA: Diagnosis not present

## 2022-01-09 DIAGNOSIS — Z79899 Other long term (current) drug therapy: Secondary | ICD-10-CM | POA: Diagnosis not present

## 2022-01-09 DIAGNOSIS — Z86010 Personal history of colonic polyps: Secondary | ICD-10-CM | POA: Insufficient documentation

## 2022-01-09 DIAGNOSIS — F17210 Nicotine dependence, cigarettes, uncomplicated: Secondary | ICD-10-CM | POA: Insufficient documentation

## 2022-01-09 DIAGNOSIS — Z1211 Encounter for screening for malignant neoplasm of colon: Secondary | ICD-10-CM | POA: Insufficient documentation

## 2022-01-09 DIAGNOSIS — K219 Gastro-esophageal reflux disease without esophagitis: Secondary | ICD-10-CM | POA: Diagnosis not present

## 2022-01-09 DIAGNOSIS — Z955 Presence of coronary angioplasty implant and graft: Secondary | ICD-10-CM | POA: Diagnosis not present

## 2022-01-09 DIAGNOSIS — I2510 Atherosclerotic heart disease of native coronary artery without angina pectoris: Secondary | ICD-10-CM | POA: Diagnosis not present

## 2022-01-09 DIAGNOSIS — K5730 Diverticulosis of large intestine without perforation or abscess without bleeding: Secondary | ICD-10-CM | POA: Diagnosis not present

## 2022-01-09 DIAGNOSIS — K640 First degree hemorrhoids: Secondary | ICD-10-CM | POA: Insufficient documentation

## 2022-01-09 HISTORY — PX: COLONOSCOPY: SHX5424

## 2022-01-09 SURGERY — COLONOSCOPY
Anesthesia: General

## 2022-01-09 MED ORDER — LIDOCAINE HCL (PF) 2 % IJ SOLN
INTRAMUSCULAR | Status: AC
  Filled 2022-01-09: qty 5

## 2022-01-09 MED ORDER — STERILE WATER FOR IRRIGATION IR SOLN
Status: DC | PRN
  Administered 2022-01-09: 20 mL

## 2022-01-09 MED ORDER — SODIUM CHLORIDE 0.9 % IV SOLN: INTRAVENOUS | Status: DC

## 2022-01-09 MED ORDER — LIDOCAINE HCL (CARDIAC) PF 100 MG/5ML IV SOSY
PREFILLED_SYRINGE | INTRAVENOUS | Status: DC | PRN
  Administered 2022-01-09: 100 mg via INTRAVENOUS

## 2022-01-09 MED ORDER — PROPOFOL 10 MG/ML IV BOLUS
INTRAVENOUS | Status: DC | PRN
  Administered 2022-01-09: 120 ug/kg/min via INTRAVENOUS
  Administered 2022-01-09: 100 mg via INTRAVENOUS

## 2022-01-09 MED ORDER — PROPOFOL 10 MG/ML IV BOLUS
INTRAVENOUS | Status: AC
  Filled 2022-01-09: qty 20

## 2022-01-09 NOTE — Anesthesia Postprocedure Evaluation (Signed)
Anesthesia Post Note  Patient: Bernard Harris [facility]  Procedure(s) Performed: COLONOSCOPY  Patient location during evaluation: PACU Anesthesia Type: General Level of consciousness: awake and alert, oriented and patient cooperative Pain management: pain level controlled Vital Signs Assessment: post-procedure vital signs reviewed and stable Respiratory status: spontaneous breathing, nonlabored ventilation and respiratory function stable Cardiovascular status: blood pressure returned to baseline and stable Postop Assessment: adequate PO intake Anesthetic complications: no   No notable events documented.   Last Vitals:  Vitals:   01/09/22 1412 01/09/22 1422  BP: 128/89 (!) 150/98  Pulse: 71 68  Resp: 17 16  Temp:    SpO2: 98% 100%    Last Pain:  Vitals:   01/09/22 1422  TempSrc:   PainSc: 0-No pain                 Reed Breech

## 2022-01-09 NOTE — Op Note (Signed)
[facility] Patient Name: Vassie MoselleManuel Warr Procedure Date: 01/09/2022 1:36 PM MRN: [id number] Account #: [id number] Date of Birth: 01/18/1961 Admit Type: Outpatient Age: [age] [facility] Gender: Male Note Status: Finalized Instrument Name: Prentice DockerColonoscope [id number] Procedure:             Colonoscopy Indications:           Surveillance: Personal history of adenomatous polyps                         on last colonoscopy > 5 years ago Providers:             Royce Macadamiaeodoro K. Norma Fredricksonoledo MD, MD Referring MD:          Teena Iraniavid M. Terance HartBronstein, MD (Referring MD) Medicines:             Propofol per Anesthesia Complications:         No immediate complications. Procedure:             Pre-Anesthesia Assessment:                        - The risks and benefits of the procedure and the                         sedation options and risks were discussed with the                         patient. All questions were answered and informed                         consent was obtained.                        - Patient identification and proposed procedure were                         verified prior to the procedure by the nurse. The                         procedure was verified in the procedure room.                        - ASA Grade Assessment: III - A patient with severe                         systemic disease.                        - After reviewing the risks and benefits, the patient                         was deemed in satisfactory condition to undergo the                         procedure.                        After obtaining informed consent, the colonoscope was  passed under direct vision. Throughout the procedure,                         the patient's blood pressure, pulse, and oxygen                         saturations were monitored continuously. The                         Colonoscope was introduced through the anus and                          advanced to the the cecum, identified by appendiceal                         orifice and ileocecal valve. The colonoscopy was                         performed without difficulty. The patient tolerated                         the procedure well. The quality of the bowel                         preparation was good. The ileocecal valve, appendiceal                         orifice, and rectum were photographed. Findings:      The perianal and digital rectal examinations were normal. Pertinent       negatives include normal sphincter tone and no palpable rectal lesions.      Non-bleeding internal hemorrhoids were found during retroflexion. The       hemorrhoids were Grade I (internal hemorrhoids that do not prolapse).      Many small and large-mouthed diverticula were found in the left colon       and right colon. There was no evidence of diverticular bleeding.      A 4 mm polyp was found in the rectum. The polyp was sessile. The polyp       was removed with a jumbo cold forceps. Resection and retrieval were       complete.      The exam was otherwise without abnormality. Impression:            - Non-bleeding internal hemorrhoids.                        - Mild diverticulosis in the left colon and in the                         right colon. There was no evidence of diverticular                         bleeding.                        - One 4 mm polyp in the rectum, removed with a jumbo                         cold forceps. Resected and  retrieved.                        - The examination was otherwise normal. Recommendation:        - Patient has a contact number available for                         emergencies. The signs and symptoms of potential                         delayed complications were discussed with the patient.                         Return to normal activities tomorrow. Written                         discharge instructions were provided to the patient.                         - Resume previous diet.                        - Continue present medications.                        - Repeat colonoscopy in 5 years for surveillance.                        - Return to GI office PRN.                        - The findings and recommendations were discussed with                         the patient. Procedure Code(s):     --- Professional ---                        [phone], Colonoscopy, flexible; with biopsy, single or                         multiple Diagnosis Code(s):     --- Professional ---                        K57.30, Diverticulosis of large intestine without                         perforation or abscess without bleeding                        K62.1, Rectal polyp                        K64.0, First degree hemorrhoids                        Z86.010, Personal history of colonic polyps CPT copyright 2019 American Medical Association. All rights reserved. The codes documented in this report are preliminary and upon coder review may  be revised to meet current compliance requirements. Stanton Kidney MD, MD 01/09/2022 2:02:10 PM This report has been signed electronically. Number of  Addenda: 0 Note Initiated On: 01/09/2022 1:36 PM Scope Withdrawal Time: 0 hours 8 minutes 52 seconds  Total Procedure Duration: 0 hours 14 minutes 5 seconds  Estimated Blood Loss:  Estimated blood loss: none.      [facility]

## 2022-01-09 NOTE — Interval H&P Note (Signed)
History and Physical Interval Note:  01/09/2022 1:31 PM  Bernard Harris  has presented today for surgery, with the diagnosis of Personal history of colonic polyps (Z86.010).  The various methods of treatment have been discussed with the patient and family. After consideration of risks, benefits and other options for treatment, the patient has consented to  Procedure(s): COLONOSCOPY (N/A) as a surgical intervention.  The patient's history has been reviewed, patient examined, no change in status, stable for surgery.  I have reviewed the patient's chart and labs.  Questions were answered to the patient's satisfaction.     [place], [place]

## 2022-01-09 NOTE — H&P (Signed)
Outpatient short stay form Pre-procedure 01/09/2022 1:30 PM Bernard Harris K. Alice Reichert, M.D.  Primary Physician: Juluis Pitch, M.D.  Reason for visit:  Personal history of multiple adenomatous colon polyps (2012)  History of present illness:  Mr. Bernard Harris presents to the clinic for chief complaint of high-risk colon cancer surveillance secondary to personal history of adenomatous colon polyps. He was seen in the clinic last year, but he canceled his colonoscopy. He is here today to reschedule. Last colonoscopy 05/2011 performed at [facility] showed multiple adenomatous polyps. He denies any known family history of colon cancer or adenomatous polyps. He denies any acute GI complaints today. He reports he is having formed bowel movements without any urgency or incontinence. He denies any issues with hematochezia or melena. He denies any associated abdominal pain or cramping preceding bowel movements. He reports appetite and diet are stable with no unintentional weight loss. He does drink 2 glasses of liquor nightly along with several beers. He denies any upper GI symptoms such as nausea, vomiting, dysphagia, odynophagia, early satiety, epigastric abdominal pain. He is compliant with omeprazole 20 mg daily and this works well to prevent GERD exacerbations. He offers no other specific complaints today. He does have a history of NSTEMI with stent placed 10/2009 on baby aspirin for antiplatelet therapy. He does have a history of COPD along with tobacco abuse. He is currently smoking half pack per day.    Current Facility-Administered Medications:    0.9 %  sodium chloride infusion, , Intravenous, Continuous, Wood, Benay Pike, MD, Last Rate: 20 mL/hr at 01/09/22 1238, New Bag at 01/09/22 1238  Medications Prior to Admission  Medication Sig Dispense Refill Last Dose   acamprosate (CAMPRAL) 333 MG tablet Take 666 mg by mouth 3 (three) times daily with meals.   Past Month   albuterol (VENTOLIN HFA) 108 (90 Base) MCG/ACT  inhaler Inhale 2 puffs into the lungs every 6 (six) hours as needed for wheezing or shortness of breath.    Past Month   amLODipine (NORVASC) 10 MG tablet 5 mg.   Past Month   aspirin EC 81 MG tablet Take 81 mg by mouth daily.   Past Month   cetirizine (ZYRTEC) 10 MG tablet Take 10 mg by mouth daily.    Past Month   cyanocobalamin 1000 MCG tablet Take by mouth.   Past Month   fluticasone (FLONASE) 50 MCG/ACT nasal spray Place 2 sprays into both nostrils daily.    Past Month   lisinopril (PRINIVIL,ZESTRIL) 20 MG tablet Take 20 mg by mouth daily.    Past Month   magnesium oxide (MAG-OX) 400 MG tablet Take 400 mg by mouth daily.   Past Month   metoprolol tartrate (LOPRESSOR) 50 MG tablet Take 50 mg by mouth 2 (two) times daily.   Past Month   nitroGLYCERIN (NITROSTAT) 0.4 MG SL tablet Place 0.4 mg under the tongue every 5 (five) minutes as needed for chest pain.   Past Month   Omega-3 Fatty Acids (FISH OIL) 1200 MG CAPS Take 1,200 mg by mouth daily.    Past Month   omeprazole (PRILOSEC) 20 MG capsule Take 20 mg by mouth daily.   Past Month   oxyCODONE-acetaminophen (PERCOCET) 5-325 MG tablet Take 1 tablet by mouth every 4 (four) hours as needed. 12 tablet 0 Past Month   simvastatin (ZOCOR) 20 MG tablet Take 20 mg by mouth at bedtime.    Past Month     No Known Allergies   Past Medical History:  Diagnosis  Date   COPD (chronic obstructive pulmonary disease) ([place])    Coronary artery disease    Current every day smoker    Dyspnea    with exertion   GERD (gastroesophageal reflux disease)    Heart attack ([place])    X 2   Hyperlipidemia    Hypertension     Review of systems:  Otherwise negative.    Physical Exam  Gen: Alert, oriented. Appears stated age.  HEENT: /AT. PERRLA. Lungs: CTA, no wheezes. CV: RR nl S1, S2. Abd: soft, benign, no masses. BS+ Ext: No edema. Pulses 2+    Planned procedures: Proceed with colonoscopy. The patient understands the nature of the planned  procedure, indications, risks, alternatives and potential complications including but not limited to bleeding, infection, perforation, damage to internal organs and possible oversedation/side effects from anesthesia. The patient agrees and gives consent to proceed.  Please refer to procedure notes for findings, recommendations and patient disposition/instructions.     Bernard Harris K. Alice Reichert, M.D. Gastroenterology 01/09/2022  1:30 PM

## 2022-01-09 NOTE — Anesthesia Preprocedure Evaluation (Signed)
Anesthesia Evaluation  Patient identified by MRN, date of birth, ID band Patient awake    Reviewed: Allergy & Precautions, NPO status , Patient's Chart, lab work & pertinent test results  Airway Mallampati: III  TM Distance: >3 FB Neck ROM: full    Dental  (+) Chipped, Poor Dentition   Pulmonary shortness of breath and with exertion, COPD, Current SmokerPatient did not abstain from smoking.,    Pulmonary exam normal        Cardiovascular Exercise Tolerance: Poor hypertension, + CAD, + Past MI, + Cardiac Stents and + DOE  Normal cardiovascular exam     Neuro/Psych negative neurological ROS  negative psych ROS   GI/Hepatic GERD  Controlled,(+)     substance abuse  alcohol use,   Endo/Other  negative endocrine ROS  Renal/GU negative Renal ROS  negative genitourinary   Musculoskeletal   Abdominal Normal abdominal exam  (+)   Peds  Hematology negative hematology ROS (+)   Anesthesia Other Findings Past Medical History: No date: COPD (chronic obstructive pulmonary disease) ([facility]) No date: Coronary artery disease No date: Current every day smoker No date: Dyspnea     Comment:  with exertion No date: GERD (gastroesophageal reflux disease) No date: Heart attack ([facility])     Comment:  X 2 No date: Hyperlipidemia No date: Hypertension  Past Surgical History: No date: CARDIAC CATHETERIZATION No date: CORONARY STENT PLACEMENT 1980's: FOOT SURGERY; Bilateral  BMI    Body Mass Index: 17.28 kg/m      Reproductive/Obstetrics negative OB ROS                             Anesthesia Physical Anesthesia Plan  ASA: 3  Anesthesia Plan: General   Post-op Pain Management: Minimal or no pain anticipated   Induction: Intravenous  PONV Risk Score and Plan: Propofol infusion and TIVA  Airway Management Planned: Natural Airway  Additional Equipment:   Intra-op Plan:   Post-operative Plan:    Informed Consent: I have reviewed the patients History and Physical, chart, labs and discussed the procedure including the risks, benefits and alternatives for the proposed anesthesia with the patient or authorized representative who has indicated his/her understanding and acceptance.     Dental Advisory Given  Plan Discussed with: Anesthesiologist, CRNA and Surgeon  Anesthesia Plan Comments:         Anesthesia Quick Evaluation

## 2022-01-09 NOTE — Transfer of Care (Signed)
Immediate Anesthesia Transfer of Care Note  Patient: Murriel Becken [facility]  Procedure(s) Performed: COLONOSCOPY  Patient Location: PACU  Anesthesia Type:General  Level of Consciousness: drowsy  Airway & Oxygen Therapy: Patient Spontanous Breathing  Post-op Assessment: Report given to RN and Post -op Vital signs reviewed and stable  Post vital signs: Reviewed and stable  Last Vitals:  Vitals Value Taken Time  BP 118/81 01/09/22 1402  Temp 35.7 C 01/09/22 1402  Pulse 74 01/09/22 1402  Resp 22 01/09/22 1402  SpO2 100 % 01/09/22 1402  Vitals shown include unvalidated device data.  Last Pain:  Vitals:   01/09/22 1402  TempSrc: Temporal  PainSc: Asleep         Complications: No notable events documented.

## 2022-01-10 ENCOUNTER — Encounter: Payer: Self-pay | Admitting: Internal Medicine

## 2022-01-10 LAB — SURGICAL PATHOLOGY

## 2022-08-20 ENCOUNTER — Ambulatory Visit
Admission: RE | Admit: 2022-08-20 | Discharge: 2022-08-20 | Disposition: A | Discharge status: Home / Self Care | Payer: Medicare Other | Source: Ambulatory Visit | Attending: Family Medicine | Admitting: Family Medicine

## 2022-08-20 ENCOUNTER — Other Ambulatory Visit: Payer: Self-pay | Admitting: Family Medicine

## 2022-08-20 ENCOUNTER — Ambulatory Visit: Payer: Medicare Other

## 2022-08-20 DIAGNOSIS — S0990XA Unspecified injury of head, initial encounter: Secondary | ICD-10-CM

## 2022-10-04 ENCOUNTER — Ambulatory Visit
Admission: RE | Admit: 2022-10-04 | Discharge: 2022-10-04 | Disposition: A | Discharge status: Home / Self Care | Payer: Medicare HMO | Source: Ambulatory Visit | Attending: *Deleted | Admitting: *Deleted

## 2022-10-04 DIAGNOSIS — Z122 Encounter for screening for malignant neoplasm of respiratory organs: Secondary | ICD-10-CM | POA: Insufficient documentation

## 2022-10-04 DIAGNOSIS — K760 Fatty (change of) liver, not elsewhere classified: Secondary | ICD-10-CM | POA: Diagnosis not present

## 2022-10-04 DIAGNOSIS — F17210 Nicotine dependence, cigarettes, uncomplicated: Secondary | ICD-10-CM | POA: Diagnosis present

## 2022-10-04 DIAGNOSIS — Z87891 Personal history of nicotine dependence: Secondary | ICD-10-CM

## 2022-10-04 DIAGNOSIS — I2510 Atherosclerotic heart disease of native coronary artery without angina pectoris: Secondary | ICD-10-CM | POA: Diagnosis not present

## 2022-10-04 DIAGNOSIS — J432 Centrilobular emphysema: Secondary | ICD-10-CM | POA: Insufficient documentation

## 2022-10-04 DIAGNOSIS — I700 Atherosclerosis of aorta: Secondary | ICD-10-CM | POA: Insufficient documentation

## 2022-10-07 ENCOUNTER — Other Ambulatory Visit: Payer: Self-pay | Admitting: Acute Care

## 2022-10-07 DIAGNOSIS — Z87891 Personal history of nicotine dependence: Secondary | ICD-10-CM

## 2022-10-07 DIAGNOSIS — Z122 Encounter for screening for malignant neoplasm of respiratory organs: Secondary | ICD-10-CM

## 2022-10-07 DIAGNOSIS — F17210 Nicotine dependence, cigarettes, uncomplicated: Secondary | ICD-10-CM

## 2022-10-10 ENCOUNTER — Other Ambulatory Visit: Payer: Self-pay

## 2023-02-01 IMAGING — CT CT CHEST LUNG CANCER SCREENING LOW DOSE W/O CM
2 of 5 series · 15 of 40 positions shown, 18 images · non-contrast
Comparison: 09/20/2020.

CLINICAL DATA: Current smoker, 60 pack-year history.



[Series 3: lung 1.00 · axial · 0.59mm/px · z∈[-1233,-913]mm · 12 of 353 slices shown, 15 images]
[im 17/353  mediastinal]
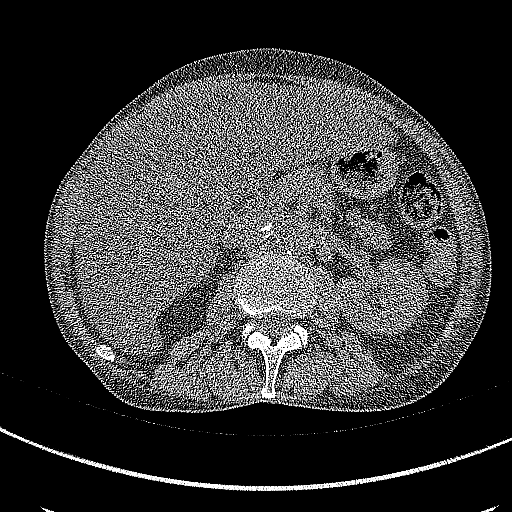
[im 17/353  lung]
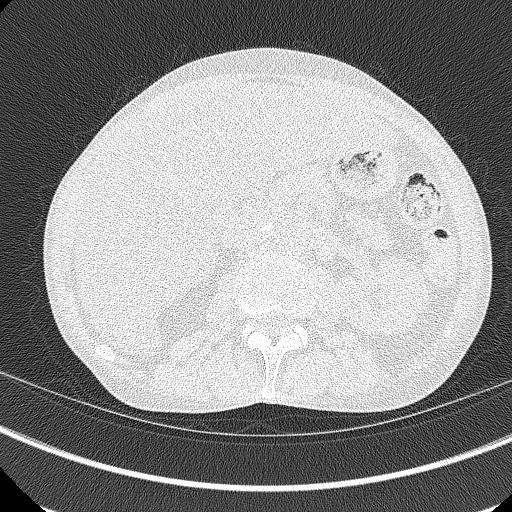
[im 49/353  lung]
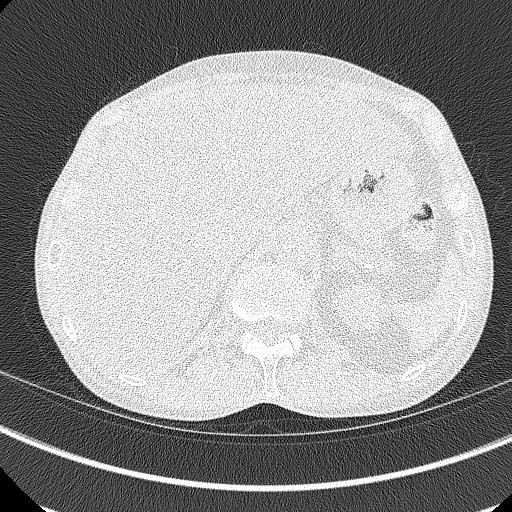
[im 81/353  lung]
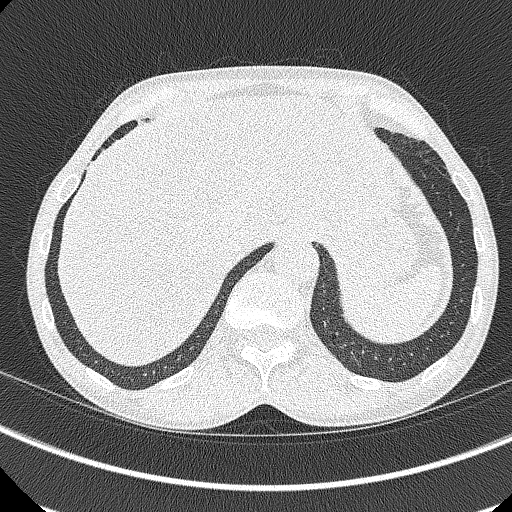
[im 113/353  lung]
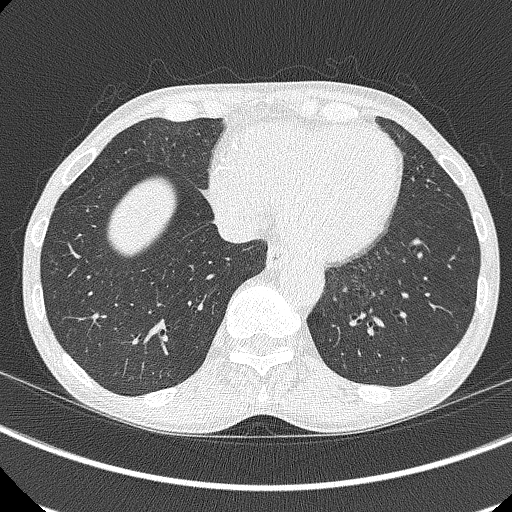
[im 129/353  mediastinal]
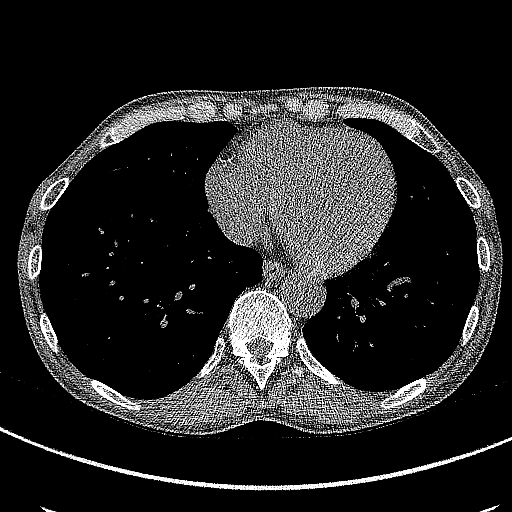
[im 129/353  lung]
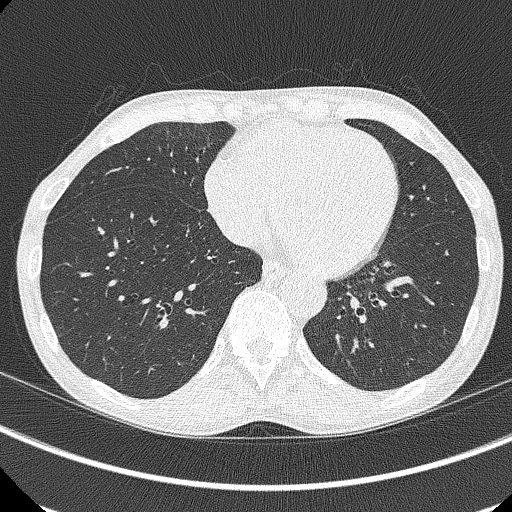
[im 161/353  lung]
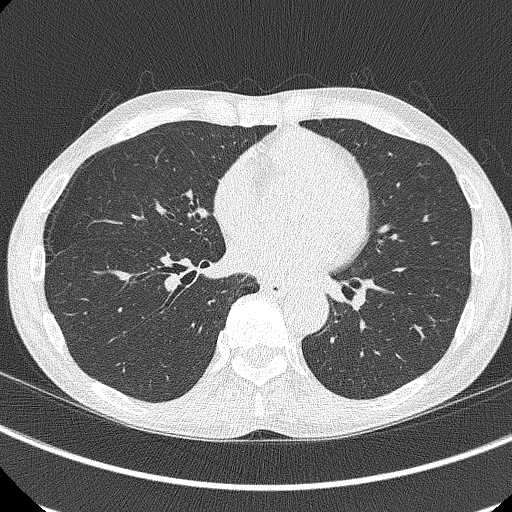
[im 193/353  lung]
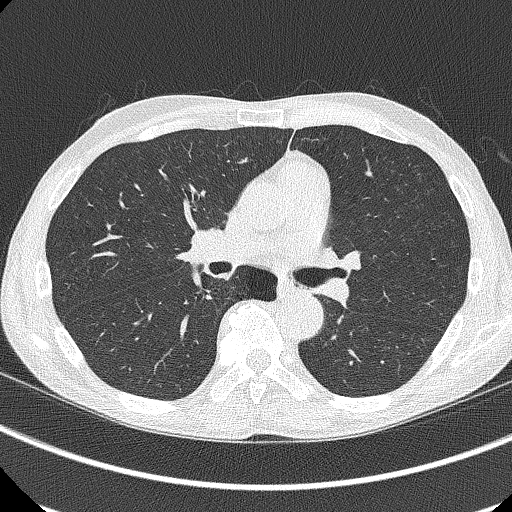
[im 225/353  lung]
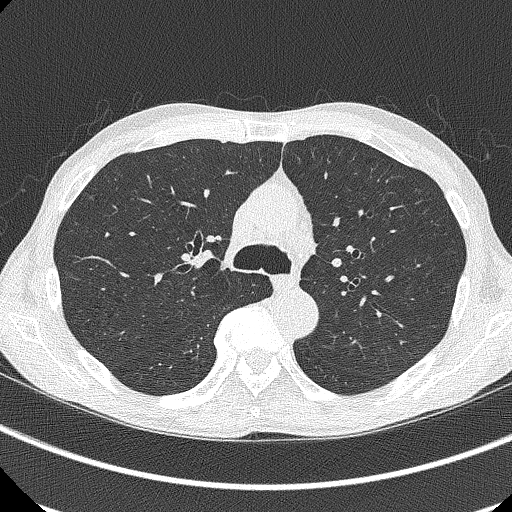
[im 241/353  mediastinal]
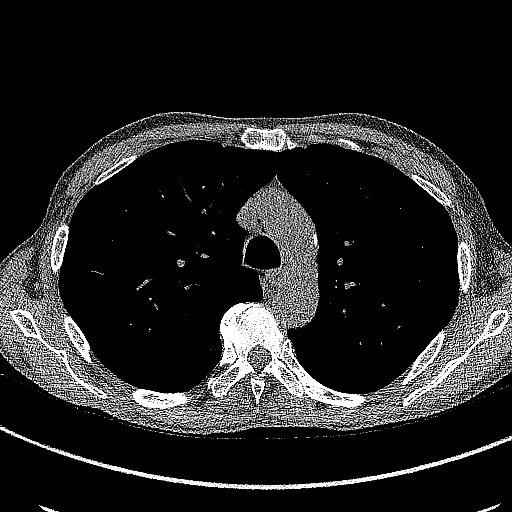
[im 241/353  lung]
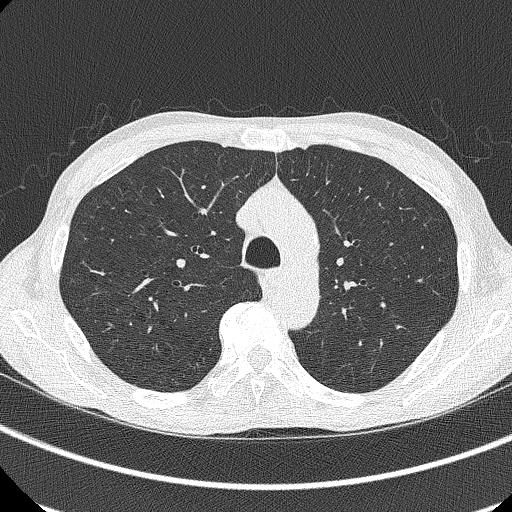
[im 273/353  lung]
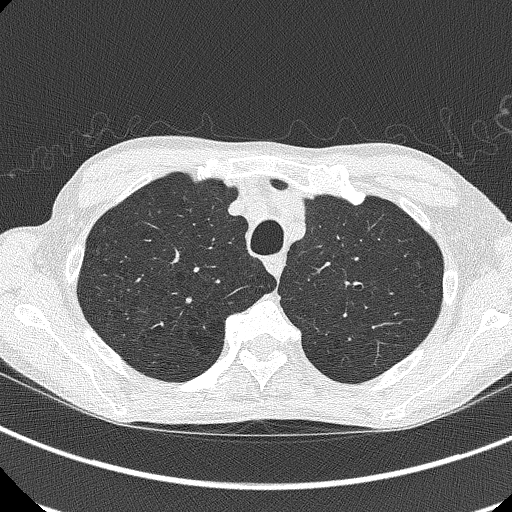
[im 305/353  lung]
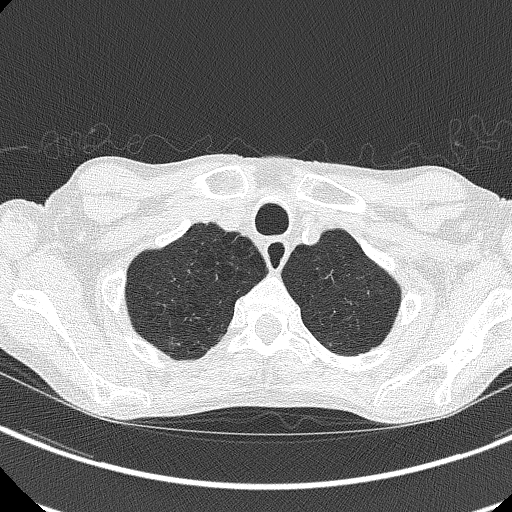
[im 337/353  lung]
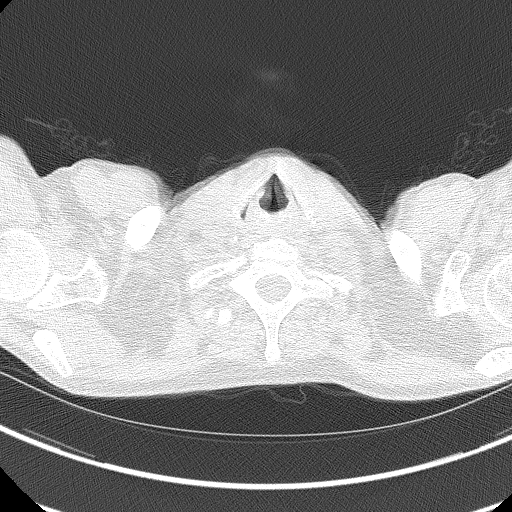

[Series 5: coronals lung 1.00 cor · coronal · 0.59mm/px · 3 of 232 slices shown]
[im 47/232  lung]
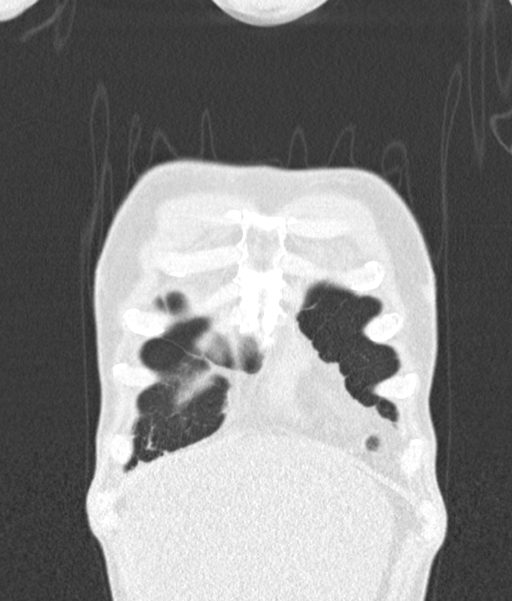
[im 93/232  lung]
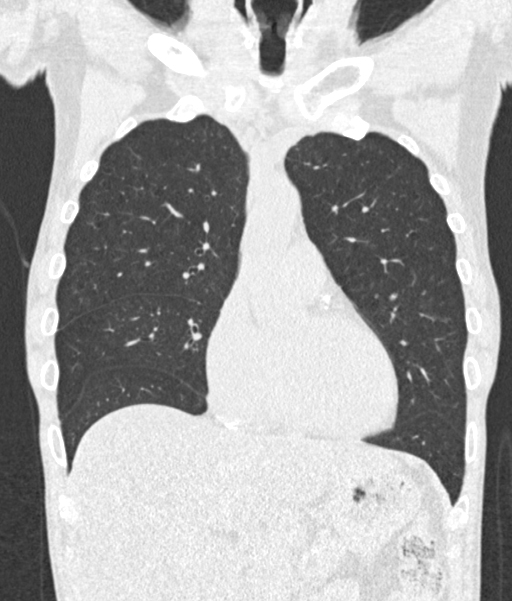
[im 139/232  lung]
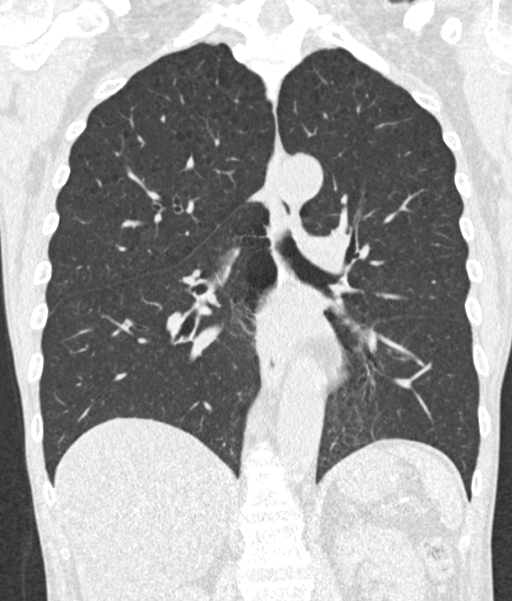

[15 of 40 positions shown; findings below may reference images not displayed]

FINDINGS: Cardiovascular: Atherosclerotic calcification of the aorta, aortic
valve and coronary arteries. Heart size normal. No pericardial
effusion.

Mediastinum/Nodes: No pathologically enlarged mediastinal or
axillary lymph nodes. Hilar regions are difficult to definitively
evaluate without IV contrast. Esophagus is grossly unremarkable.

Lungs/Pleura: Centrilobular and paraseptal emphysema. Pulmonary
nodules measure 4.2 mm or less in size, as before. No pleural fluid.
Debris is seen dependently in the airway.

Upper Abdomen: Liver is decreased in attenuation. Visualized
portions of the liver, adrenal glands, kidneys, spleen, pancreas,
stomach and bowel are otherwise unremarkable.

Musculoskeletal: Degenerative changes in the spine. No worrisome
lytic or sclerotic lesions.
IMPRESSION: 1. Lung-RADS 2, benign appearance or behavior. Continue annual
screening with low-dose chest CT without contrast in 12 months.
2. Hepatic steatosis.
3. Aortic atherosclerosis (IRUI0-A7C.C). Coronary artery
calcification.
4.  Emphysema ([id number].B).

## 2023-09-10 DIAGNOSIS — Z125 Encounter for screening for malignant neoplasm of prostate: Secondary | ICD-10-CM | POA: Diagnosis not present

## 2023-09-10 DIAGNOSIS — I10 Essential (primary) hypertension: Secondary | ICD-10-CM | POA: Diagnosis not present

## 2023-09-10 DIAGNOSIS — D649 Anemia, unspecified: Secondary | ICD-10-CM | POA: Diagnosis not present

## 2023-09-10 DIAGNOSIS — Z79899 Other long term (current) drug therapy: Secondary | ICD-10-CM | POA: Diagnosis not present

## 2023-10-02 DIAGNOSIS — Z720 Tobacco use: Secondary | ICD-10-CM | POA: Diagnosis not present

## 2023-10-02 DIAGNOSIS — Z955 Presence of coronary angioplasty implant and graft: Secondary | ICD-10-CM | POA: Diagnosis not present

## 2023-10-02 DIAGNOSIS — I10 Essential (primary) hypertension: Secondary | ICD-10-CM | POA: Diagnosis not present

## 2023-10-02 DIAGNOSIS — I214 Non-ST elevation (NSTEMI) myocardial infarction: Secondary | ICD-10-CM | POA: Diagnosis not present

## 2023-10-02 DIAGNOSIS — F1010 Alcohol abuse, uncomplicated: Secondary | ICD-10-CM | POA: Diagnosis not present

## 2023-10-02 DIAGNOSIS — E7800 Pure hypercholesterolemia, unspecified: Secondary | ICD-10-CM | POA: Diagnosis not present

## 2023-10-09 ENCOUNTER — Other Ambulatory Visit: Payer: Self-pay | Admitting: Acute Care

## 2023-10-09 DIAGNOSIS — Z87891 Personal history of nicotine dependence: Secondary | ICD-10-CM

## 2023-10-09 DIAGNOSIS — F17210 Nicotine dependence, cigarettes, uncomplicated: Secondary | ICD-10-CM

## 2023-10-09 DIAGNOSIS — Z122 Encounter for screening for malignant neoplasm of respiratory organs: Secondary | ICD-10-CM

## 2023-10-16 ENCOUNTER — Ambulatory Visit

## 2023-11-04 DIAGNOSIS — R739 Hyperglycemia, unspecified: Secondary | ICD-10-CM | POA: Diagnosis not present

## 2023-11-04 DIAGNOSIS — Z720 Tobacco use: Secondary | ICD-10-CM | POA: Diagnosis not present

## 2023-11-04 DIAGNOSIS — K219 Gastro-esophageal reflux disease without esophagitis: Secondary | ICD-10-CM | POA: Diagnosis not present

## 2023-11-04 DIAGNOSIS — Z0000 Encounter for general adult medical examination without abnormal findings: Secondary | ICD-10-CM | POA: Diagnosis not present

## 2023-11-04 DIAGNOSIS — D649 Anemia, unspecified: Secondary | ICD-10-CM | POA: Diagnosis not present

## 2023-11-04 DIAGNOSIS — I10 Essential (primary) hypertension: Secondary | ICD-10-CM | POA: Diagnosis not present

## 2023-11-04 DIAGNOSIS — E785 Hyperlipidemia, unspecified: Secondary | ICD-10-CM | POA: Diagnosis not present

## 2024-04-15 ENCOUNTER — Inpatient Hospital Stay
Admission: EM | Admit: 2024-04-15 | Discharge: 2024-04-30 | DRG: 239 | Disposition: A | Discharge status: Skilled Nursing Facility | Attending: Hospitalist | Admitting: Hospitalist

## 2024-04-15 ENCOUNTER — Other Ambulatory Visit: Payer: Self-pay

## 2024-04-15 DIAGNOSIS — K922 Gastrointestinal hemorrhage, unspecified: Secondary | ICD-10-CM

## 2024-04-15 DIAGNOSIS — I110 Hypertensive heart disease with heart failure: Secondary | ICD-10-CM | POA: Diagnosis present

## 2024-04-15 DIAGNOSIS — F1090 Alcohol use, unspecified, uncomplicated: Secondary | ICD-10-CM | POA: Diagnosis present

## 2024-04-15 DIAGNOSIS — T39395A Adverse effect of other nonsteroidal anti-inflammatory drugs [NSAID], initial encounter: Secondary | ICD-10-CM | POA: Diagnosis present

## 2024-04-15 DIAGNOSIS — F17210 Nicotine dependence, cigarettes, uncomplicated: Secondary | ICD-10-CM | POA: Diagnosis present

## 2024-04-15 DIAGNOSIS — I5022 Chronic systolic (congestive) heart failure: Secondary | ICD-10-CM | POA: Diagnosis present

## 2024-04-15 DIAGNOSIS — D75838 Other thrombocytosis: Secondary | ICD-10-CM | POA: Diagnosis present

## 2024-04-15 DIAGNOSIS — E43 Unspecified severe protein-calorie malnutrition: Secondary | ICD-10-CM | POA: Diagnosis present

## 2024-04-15 DIAGNOSIS — J449 Chronic obstructive pulmonary disease, unspecified: Secondary | ICD-10-CM | POA: Diagnosis present

## 2024-04-15 DIAGNOSIS — L853 Xerosis cutis: Secondary | ICD-10-CM | POA: Diagnosis present

## 2024-04-15 DIAGNOSIS — I2510 Atherosclerotic heart disease of native coronary artery without angina pectoris: Secondary | ICD-10-CM | POA: Diagnosis present

## 2024-04-15 DIAGNOSIS — K921 Melena: Secondary | ICD-10-CM

## 2024-04-15 DIAGNOSIS — Z5941 Food insecurity: Secondary | ICD-10-CM

## 2024-04-15 DIAGNOSIS — K2960 Other gastritis without bleeding: Secondary | ICD-10-CM

## 2024-04-15 DIAGNOSIS — K2921 Alcoholic gastritis with bleeding: Secondary | ICD-10-CM | POA: Diagnosis present

## 2024-04-15 DIAGNOSIS — M86371 Chronic multifocal osteomyelitis, right ankle and foot: Secondary | ICD-10-CM | POA: Diagnosis present

## 2024-04-15 DIAGNOSIS — Z681 Body mass index (BMI) 19 or less, adult: Secondary | ICD-10-CM

## 2024-04-15 DIAGNOSIS — I252 Old myocardial infarction: Secondary | ICD-10-CM

## 2024-04-15 DIAGNOSIS — Z955 Presence of coronary angioplasty implant and graft: Secondary | ICD-10-CM

## 2024-04-15 DIAGNOSIS — K8020 Calculus of gallbladder without cholecystitis without obstruction: Secondary | ICD-10-CM | POA: Diagnosis present

## 2024-04-15 DIAGNOSIS — I96 Gangrene, not elsewhere classified: Secondary | ICD-10-CM | POA: Diagnosis not present

## 2024-04-15 DIAGNOSIS — N179 Acute kidney failure, unspecified: Secondary | ICD-10-CM | POA: Diagnosis present

## 2024-04-15 DIAGNOSIS — E861 Hypovolemia: Secondary | ICD-10-CM | POA: Diagnosis present

## 2024-04-15 DIAGNOSIS — R64 Cachexia: Secondary | ICD-10-CM | POA: Diagnosis present

## 2024-04-15 DIAGNOSIS — R460 Very low level of personal hygiene: Secondary | ICD-10-CM

## 2024-04-15 DIAGNOSIS — E860 Dehydration: Secondary | ICD-10-CM | POA: Diagnosis present

## 2024-04-15 DIAGNOSIS — I9589 Other hypotension: Secondary | ICD-10-CM | POA: Diagnosis present

## 2024-04-15 DIAGNOSIS — K861 Other chronic pancreatitis: Secondary | ICD-10-CM | POA: Diagnosis present

## 2024-04-15 DIAGNOSIS — D62 Acute posthemorrhagic anemia: Secondary | ICD-10-CM | POA: Diagnosis present

## 2024-04-15 DIAGNOSIS — F17200 Nicotine dependence, unspecified, uncomplicated: Secondary | ICD-10-CM | POA: Diagnosis present

## 2024-04-15 DIAGNOSIS — K5790 Diverticulosis of intestine, part unspecified, without perforation or abscess without bleeding: Secondary | ICD-10-CM

## 2024-04-15 DIAGNOSIS — D649 Anemia, unspecified: Principal | ICD-10-CM

## 2024-04-15 DIAGNOSIS — K219 Gastro-esophageal reflux disease without esophagitis: Secondary | ICD-10-CM | POA: Diagnosis present

## 2024-04-15 DIAGNOSIS — L603 Nail dystrophy: Secondary | ICD-10-CM | POA: Diagnosis present

## 2024-04-15 DIAGNOSIS — E785 Hyperlipidemia, unspecified: Secondary | ICD-10-CM | POA: Diagnosis present

## 2024-04-15 DIAGNOSIS — K5730 Diverticulosis of large intestine without perforation or abscess without bleeding: Secondary | ICD-10-CM | POA: Diagnosis present

## 2024-04-15 DIAGNOSIS — Z7982 Long term (current) use of aspirin: Secondary | ICD-10-CM

## 2024-04-15 DIAGNOSIS — K760 Fatty (change of) liver, not elsewhere classified: Secondary | ICD-10-CM | POA: Diagnosis present

## 2024-04-15 DIAGNOSIS — E878 Other disorders of electrolyte and fluid balance, not elsewhere classified: Secondary | ICD-10-CM

## 2024-04-15 DIAGNOSIS — R7989 Other specified abnormal findings of blood chemistry: Secondary | ICD-10-CM | POA: Insufficient documentation

## 2024-04-15 DIAGNOSIS — E871 Hypo-osmolality and hyponatremia: Secondary | ICD-10-CM | POA: Diagnosis present

## 2024-04-15 DIAGNOSIS — K3189 Other diseases of stomach and duodenum: Secondary | ICD-10-CM | POA: Diagnosis present

## 2024-04-15 DIAGNOSIS — E876 Hypokalemia: Secondary | ICD-10-CM | POA: Diagnosis not present

## 2024-04-15 DIAGNOSIS — R9431 Abnormal electrocardiogram [ECG] [EKG]: Secondary | ICD-10-CM | POA: Diagnosis not present

## 2024-04-15 DIAGNOSIS — I2489 Other forms of acute ischemic heart disease: Secondary | ICD-10-CM | POA: Diagnosis present

## 2024-04-15 DIAGNOSIS — Z79899 Other long term (current) drug therapy: Secondary | ICD-10-CM

## 2024-04-15 DIAGNOSIS — F1010 Alcohol abuse, uncomplicated: Secondary | ICD-10-CM | POA: Diagnosis present

## 2024-04-15 DIAGNOSIS — E872 Acidosis, unspecified: Secondary | ICD-10-CM | POA: Diagnosis present

## 2024-04-15 LAB — COMPREHENSIVE METABOLIC PANEL WITH GFR
ALT: 15 U/L (ref 0–44)
AST: 29 U/L (ref 15–41)
Albumin: 3.5 g/dL (ref 3.5–5.0)
Alkaline Phosphatase: 69 U/L (ref 38–126)
Anion gap: 17 — ABNORMAL HIGH (ref 5–15)
BUN: 79 mg/dL — ABNORMAL HIGH (ref 8–23)
CO2: 16 mmol/L — ABNORMAL LOW (ref 22–32)
Calcium: 7.9 mg/dL — ABNORMAL LOW (ref 8.9–10.3)
Chloride: 97 mmol/L — ABNORMAL LOW (ref 98–111)
Creatinine, Ser: 6.44 mg/dL — ABNORMAL HIGH (ref 0.61–1.24)
GFR, Estimated: 9 mL/min — ABNORMAL LOW (ref >60)
Glucose, Bld: 133 mg/dL — ABNORMAL HIGH (ref 70–99)
Potassium: 3.2 mmol/L — ABNORMAL LOW (ref 3.5–5.1)
Sodium: 130 mmol/L — ABNORMAL LOW (ref 135–145)
Total Bilirubin: 1 mg/dL (ref 0.0–1.2)
Total Protein: 7.9 g/dL (ref 6.5–8.1)

## 2024-04-15 LAB — CBC
HCT: 17.3 % — ABNORMAL LOW (ref 39.0–52.0)
Hemoglobin: 5.4 g/dL — ABNORMAL LOW (ref 13.0–17.0)
MCH: 24.7 pg — ABNORMAL LOW (ref 26.0–34.0)
MCHC: 31.2 g/dL (ref 30.0–36.0)
MCV: 79 fL — ABNORMAL LOW (ref 80.0–100.0)
Platelets: 465 K/uL — ABNORMAL HIGH (ref 150–400)
RBC: 2.19 MIL/uL — ABNORMAL LOW (ref 4.22–5.81)
RDW: 18.4 % — ABNORMAL HIGH (ref 11.5–15.5)
WBC: 9 K/uL (ref 4.0–10.5)
nRBC: 0 % (ref 0.0–0.2)

## 2024-04-15 LAB — TROPONIN I (HIGH SENSITIVITY): Troponin I (High Sensitivity): 277 ng/L (ref <18)

## 2024-04-15 MED ORDER — SODIUM CHLORIDE 0.9% IV SOLUTION
Freq: Once | INTRAVENOUS | Status: AC
  Filled 2024-04-15: qty 250

## 2024-04-15 MED ORDER — THIAMINE HCL 100 MG/ML IJ SOLN
100.0000 mg | Freq: Once | INTRAMUSCULAR | Status: AC
  Administered 2024-04-16: 100 mg via INTRAVENOUS
  Filled 2024-04-15: qty 2

## 2024-04-15 MED ORDER — PANTOPRAZOLE SODIUM 40 MG IV SOLR
80.0000 mg | Freq: Once | INTRAVENOUS | Status: AC
  Administered 2024-04-16: 80 mg via INTRAVENOUS
  Filled 2024-04-15: qty 20

## 2024-04-15 MED ORDER — SODIUM CHLORIDE 0.9 % IV BOLUS (SEPSIS)
1000.0000 mL | Freq: Once | INTRAVENOUS | Status: AC
  Administered 2024-04-16: 1000 mL via INTRAVENOUS

## 2024-04-15 MED ORDER — SODIUM CHLORIDE 0.9 % IV SOLN: INTRAVENOUS | Status: DC

## 2024-04-15 MED ORDER — POTASSIUM CHLORIDE CRYS ER 20 MEQ PO TBCR
40.0000 meq | EXTENDED_RELEASE_TABLET | Freq: Once | ORAL | Status: AC
  Administered 2024-04-16: 40 meq via ORAL
  Filled 2024-04-15: qty 2

## 2024-04-15 NOTE — ED Provider Notes (Signed)
 [facility] Provider Note    Event Date/Time   First MD Initiated Contact with Patient 04/15/24 2328     (approximate)   History   Weakness   HPI  Bernard Harris is a 63 y.o. male with history of alcohol use disorder, CAD status post stent, COPD not on oxygen, hypertension, hyperlipidemia who presents to the emergency department with complaints of feeling very weak.  He denies any chest pain or shortness of breath.  Son reports he has also had dark stools for several days.  He is not on blood thinners.  No known history of GI bleed.  Patient's son states he has been trying to quit drinking at home.  No seizures, tremors.  Last alcoholic beverage was days ago.  No drug use.  No urinary symptoms.  No fevers or cough.  Patient reports he has lost a lot of weight over the past few weeks.  No known history of malignancy.  Denies any fall, head injury, numbness, tingling or focal weakness.   History provided by patient, son.    Past Medical History:  Diagnosis Date   COPD (chronic obstructive pulmonary disease) (HCC)    Coronary artery disease    Current every day smoker    Dyspnea    with exertion   GERD (gastroesophageal reflux disease)    Heart attack (HCC)    X 2   Hyperlipidemia    Hypertension     Past Surgical History:  Procedure Laterality Date   CARDIAC CATHETERIZATION     COLONOSCOPY N/A 01/09/2022   Procedure: COLONOSCOPY;  Surgeon: Toledo, Ladell POUR, MD;  Location: [facility] ENDOSCOPY;  Service: Gastroenterology;  Laterality: N/A;   CORONARY STENT PLACEMENT     FOOT SURGERY Bilateral 1980's    MEDICATIONS:  Prior to Admission medications   Medication Sig Start Date End Date Taking? Authorizing Provider  acamprosate (CAMPRAL) 333 MG tablet Take 666 mg by mouth 3 (three) times daily with meals.    [provider]  albuterol (VENTOLIN HFA) 108 (90 Base) MCG/ACT inhaler Inhale 2 puffs into the lungs every 6 (six) hours as needed  for wheezing or shortness of breath.     [provider]  amLODipine (NORVASC) 10 MG tablet 5 mg. 02/28/17   [provider]  aspirin EC 81 MG tablet Take 81 mg by mouth daily.    [provider]  cetirizine (ZYRTEC) 10 MG tablet Take 10 mg by mouth daily.     [provider]  cyanocobalamin  1000 MCG tablet Take by mouth.    [provider]  fluticasone (FLONASE) 50 MCG/ACT nasal spray Place 2 sprays into both nostrils daily.     [provider]  lisinopril (PRINIVIL,ZESTRIL) 20 MG tablet Take 20 mg by mouth daily.  01/29/16   [provider]  magnesium  oxide (MAG-OX) 400 MG tablet Take 400 mg by mouth daily.    [provider]  metoprolol tartrate (LOPRESSOR) 50 MG tablet Take 50 mg by mouth 2 (two) times daily.    [provider]  nitroGLYCERIN (NITROSTAT) 0.4 MG SL tablet Place 0.4 mg under the tongue every 5 (five) minutes as needed for chest pain.    [provider]  Omega-3 Fatty Acids (FISH OIL) 1200 MG CAPS Take 1,200 mg by mouth daily.     [provider]  omeprazole (PRILOSEC) 20 MG capsule Take 20 mg by mouth daily.    [provider]  oxyCODONE -acetaminophen  (PERCOCET) 5-325 MG  tablet Take 1 tablet by mouth every 4 (four) hours as needed. 02/13/21   Edelmiro Leash, MD  simvastatin (ZOCOR) 20 MG tablet Take 20 mg by mouth at bedtime.     [provider]    Physical Exam   Triage Vital Signs: ED Triage Vitals  Encounter Vitals Group     BP 04/15/24 2031 115/89     Girls Systolic BP Percentile --      Girls Diastolic BP Percentile --      Boys Systolic BP Percentile --      Boys Diastolic BP Percentile --      Pulse Rate 04/15/24 2031 90     Resp 04/15/24 2031 16     Temp 04/15/24 2031 97.7 F (36.5 C)     Temp Source 04/15/24 2031 Oral     SpO2 04/15/24 2031 96 %     Weight 04/15/24 2029 102 lb (46.3 kg)     Height 04/15/24 2029 5' 9 (1.753 m)     Head  Circumference --      Peak Flow --      Pain Score 04/15/24 2029 0     Pain Loc --      Pain Education --      Exclude from Growth Chart --     Most recent vital signs: Vitals:   04/16/24 0729 04/16/24 0747  BP: 95/69 112/67  Pulse: 64 64  Resp: 17 18  Temp: (!) 97.4 F (36.3 C) (!) 97.3 F (36.3 C)  SpO2: 100% 100%    CONSTITUTIONAL: Alert, responds appropriately to questions.  Cachectic, malnourished, appears much older than stated age HEAD: Normocephalic, atraumatic EYES: Conjunctivae clear, pupils appear equal, sclera nonicteric ENT: normal nose; moist mucous membranes NECK: Supple, normal ROM CARD: RRR; S1 and S2 appreciated RESP: Normal chest excursion without splinting or tachypnea; breath sounds clear and equal bilaterally; no wheezes, no rhonchi, no rales, no hypoxia or respiratory distress, speaking full sentences ABD/GI: Non-distended; soft, non-tender, no rebound, no guarding, no peritoneal signs RECTAL:  Normal rectal tone, no gross blood; + melena that is guaiac positive; no hemorrhoids appreciated, nontender rectal exam, no fecal impaction. Chaperone present. BACK: The back appears normal EXT: Normal ROM in all joints; no deformity noted, no edema SKIN: Normal color for age and race; warm; no rash on exposed skin NEURO: Moves all extremities equally, normal speech, no facial asymmetry PSYCH: The patient's mood and manner are appropriate.   ED Results / Procedures / Treatments   LABS: (all labs ordered are listed, but only abnormal results are displayed) Labs Reviewed  COMPREHENSIVE METABOLIC PANEL WITH GFR - Abnormal; Notable for the following components:      Result Value   Sodium 130 (*)    Potassium 3.2 (*)    Chloride 97 (*)    CO2 16 (*)    Glucose, Bld 133 (*)    BUN 79 (*)    Creatinine, Ser 6.44 (*)    Calcium 7.9 (*)    GFR, Estimated 9 (*)    Anion gap 17 (*)    All other components within normal limits  CBC - Abnormal; Notable for the  following components:   RBC 2.19 (*)    Hemoglobin 5.4 (*)    HCT 17.3 (*)    MCV 79.0 (*)    MCH 24.7 (*)    RDW 18.4 (*)    Platelets 465 (*)    All other components within normal limits  PROTIME-INR -  Abnormal; Notable for the following components:   Prothrombin Time 17.0 (*)    INR 1.3 (*)    All other components within normal limits  MAGNESIUM  - Abnormal; Notable for the following components:   Magnesium  1.4 (*)    All other components within normal limits  TROPONIN I (HIGH SENSITIVITY) - Abnormal; Notable for the following components:   Troponin I (High Sensitivity) 277 (*)    All other components within normal limits  TROPONIN I (HIGH SENSITIVITY) - Abnormal; Notable for the following components:   Troponin I (High Sensitivity) 255 (*)    All other components within normal limits  ETHANOL  URINALYSIS, ROUTINE W REFLEX MICROSCOPIC  URINE DRUG SCREEN, QUALITATIVE ([facility] ONLY)  HIV ANTIBODY (ROUTINE TESTING W REFLEX)  HEMOGLOBIN  HEMOGLOBIN  HEMOGLOBIN  HEMOGLOBIN  HEMOGLOBIN  TYPE AND SCREEN  PREPARE RBC (CROSSMATCH)  ABO/RH     EKG:  EKG Interpretation Date/Time:  Thursday April 15 2024 20:36:25 EDT Ventricular Rate:  66 PR Interval:  144 QRS Duration:  100 QT Interval:  452 QTC Calculation: 473 R Axis:   81  Text Interpretation: Normal sinus rhythm Septal infarct (cited on or before 10-Oct-2022) ST & T wave abnormality, consider anterolateral ischemia Abnormal ECG When compared with ECG of 10-Oct-2022 11:09, Questionable change in initial forces of Anteroseptal leads ST depression has replaced ST elevation in Anterior leads T wave inversion no longer evident in Inferior leads QT has lengthened Confirmed by Neomi Neptune [phone]) on 04/15/2024 11:36:47 PM         RADIOLOGY: My personal review and interpretation of imaging: CT of the chest, abdomen pelvis showed no no signs of malignancy.  He has cholelithiasis without cholecystitis.  Diverticulosis  without diverticulitis.  I have personally reviewed all radiology reports.   CT CHEST ABDOMEN PELVIS WO CONTRAST Result Date: 04/16/2024 EXAM: CT CHEST, ABDOMEN AND PELVIS WITHOUT CONTRAST 04/16/2024 12:34:20 AM TECHNIQUE: CT of the chest, abdomen and pelvis was performed without the administration of intravenous contrast. Multiplanar reformatted images are provided for review. Automated exposure control, iterative reconstruction, and/or weight based adjustment of the mA/kV was utilized to reduce the radiation dose to as low as reasonably achievable. COMPARISON: 09/08/2022 CLINICAL HISTORY: Eval for malignancy. Pt reports leg weakness for the past 2 weeks, pt reports he feels dehydrated but denies change in eating or drinking. Pt denies dysuria, cough congestion or fever. FINDINGS: CHEST: MEDIASTINUM AND LYMPH NODES: Heart and pericardium are unremarkable. The central airways are clear. No mediastinal, hilar or axillary lymphadenopathy. LUNGS AND PLEURA: No focal consolidation or pulmonary edema. No pleural effusion or pneumothorax. Emphysema. ABDOMEN AND PELVIS: LIVER: Hepatic steatosis. GALLBLADDER AND BILE DUCTS: Cholelithiasis. No evidence of acute cholecystitis. SPLEEN: No acute abnormality. PANCREAS: Pancreatic calcifications compatible with sequelae of chronic pancreatitis. ADRENAL GLANDS: No acute abnormality. KIDNEYS, URETERS AND BLADDER: No stones in the kidneys or ureters. No hydronephrosis. No perinephric or periureteral stranding. Urinary bladder is unremarkable. GI AND BOWEL: Stomach demonstrates no acute abnormality. There is no bowel obstruction. Diverticulosis without evidence of diverticulitis. Normal appendix. REPRODUCTIVE ORGANS: No acute abnormality. PERITONEUM AND RETROPERITONEUM: No ascites. No free air. VASCULATURE: Coronary artery and aortic atherosclerotic calcification. Aorta is normal in caliber. Low-density blood pool compatible with anemia. ABDOMINAL AND PELVIS LYMPH NODES: No  lymphadenopathy. REPRODUCTIVE ORGANS: No acute abnormality. BONES AND SOFT TISSUES: No acute osseous abnormality. No focal soft tissue abnormality. Bilateral femoral head AVN. IMPRESSION: 1. No acute findings. 2. Emphysema. 3. Hepatic steatosis. 4. Pancreatic calcifications compatible with sequelae  of chronic pancreatitis. 5. Cholelithiasis without evidence of acute cholecystitis. 6. Diverticulosis without evidence of diverticulitis. 7. Bilateral femoral head AVN. Electronically signed by: Norman Gatlin MD 04/16/2024 12:54 AM EDT RP Workstation: [id number]     PROCEDURES:  Critical Care performed: Yes, see critical care procedure note(s)   CRITICAL CARE Performed by: Josette Sink   Total critical care time: 30 minutes  Critical care time was exclusive of separately billable procedures and treating other patients.  Critical care was necessary to treat or prevent imminent or life-threatening deterioration.  Critical care was time spent personally by me on the following activities: development of treatment plan with patient and/or surrogate as well as nursing, discussions with consultants, evaluation of patient's response to treatment, examination of patient, obtaining history from patient or surrogate, ordering and performing treatments and interventions, ordering and review of laboratory studies, ordering and review of radiographic studies, pulse oximetry and re-evaluation of patient's condition.   SABRA1-3 Lead EKG Interpretation  Performed by: [place] Blizzard, Josette SAILOR, DO Authorized by: Issaic Welliver, Josette SAILOR, DO     Interpretation: normal     ECG rate:  64   ECG rate assessment: normal     Rhythm: sinus rhythm     Ectopy: none     Conduction: normal       IMPRESSION / MDM / ASSESSMENT AND PLAN / ED COURSE  I reviewed the triage vital signs and the nursing notes.    Patient here with complaints of generalized weakness.  Labs obtained from triage shows significant anemia and acute renal  failure.  Has been complaining of melanotic stools and is guaiac positive here.  History of alcohol use disorder.  The patient is on the cardiac monitor to evaluate for evidence of arrhythmia and/or significant heart rate changes.   DIFFERENTIAL DIAGNOSIS (includes but not limited to):   Upper GI bleed from gastritis, peptic ulcer disease, esophageal varices, anemia secondary to acute blood loss, anemia secondary to renal failure, dehydration, malignancy, electrolyte derangement, malnutrition secondary to alcohol abuse, UTI, doubt sepsis   Patient's presentation is most consistent with acute presentation with potential threat to life or bodily function.   PLAN: Will start transfusing with 3 units of packed red blood cells.  He has a hemoglobin of 5.4 with a reactive thrombocytosis.  Normal platelets and INR.  He is positive for melena and is guaiac positive here.  Will start Protonix .  No vomiting.  No known history of esophageal varices.  Alcohol level is negative here.  He is not in any signs of withdrawal currently.  He is not encephalopathic.  Will give IV thiamine .  Patient does have a new acute renal failure which is likely secondary to dehydration, poor oral intake versus occasional NSAID use.  States he takes ibuprofen regularly.  Will give IV fluids and avoid nephrotoxic medications.  Given weight loss and cachectic appearance, I am also concerned about possible malignancy.  Will obtain noncontrast CTs of the chest, abdomen pelvis for further evaluation.  Potassium and magnesium  levels are low.  Will give IV replacement.  EKG shows QT interval of 472.  First troponin also elevated but patient denies any chest pain, shortness of breath.  Second pending.  Will hold aspirin, heparin given GI bleed.  Likely secondary to decreased clearance of troponins secondary to acute renal failure and also demand ischemia from severe anemia.   MEDICATIONS GIVEN IN ED: Medications  0.9 %  sodium  chloride infusion ( Intravenous New Bag/Given 04/16/24 0056)  octreotide  (SANDOSTATIN )  2 mcg/mL load via infusion 50 mcg (50 mcg Intravenous Bolus from Bag 04/16/24 0326)    And  octreotide  (SANDOSTATIN ) 500 mcg in sodium chloride  0.9 % 250 mL (2 mcg/mL) infusion (50 mcg/hr Intravenous New Bag/Given 04/16/24 0326)  pantoprazole  (PROTONIX ) injection 40 mg (40 mg Intravenous Given 04/16/24 0319)  LORazepam  (ATIVAN ) tablet 1-4 mg (has no administration in time range)    Or  LORazepam  (ATIVAN ) injection 1-4 mg (has no administration in time range)  thiamine  (VITAMIN B1) tablet 100 mg (has no administration in time range)    Or  thiamine  (VITAMIN B1) injection 100 mg (has no administration in time range)  folic acid  (FOLVITE ) tablet 1 mg (has no administration in time range)  multivitamin with minerals tablet 1 tablet (has no administration in time range)  sodium chloride  flush (NS) 0.9 % injection 3 mL (3 mLs Intravenous Given 04/16/24 0318)  acetaminophen  (TYLENOL ) tablet 650 mg (has no administration in time range)    Or  acetaminophen  (TYLENOL ) suppository 650 mg (has no administration in time range)  ondansetron  (ZOFRAN ) tablet 4 mg (has no administration in time range)    Or  ondansetron  (ZOFRAN ) injection 4 mg (has no administration in time range)  blood pressure control book (0 each Does not apply Hold 04/16/24 0709)  nicotine  (NICODERM CQ  - dosed in mg/24 hours) patch 21 mg (has no administration in time range)  0.9 %  sodium chloride  infusion (Manually program via Guardrails IV Fluids) (0 mLs Intravenous Stopped 04/16/24 0152)  sodium chloride  0.9 % bolus 1,000 mL (0 mLs Intravenous Stopped 04/16/24 0149)  thiamine  (VITAMIN B1) injection 100 mg (100 mg Intravenous Given 04/16/24 0017)  potassium chloride  SA (KLOR-CON  M) CR tablet 40 mEq (40 mEq Oral Given 04/16/24 0018)  pantoprazole  (PROTONIX ) injection 80 mg (80 mg Intravenous Given 04/16/24 0017)  magnesium  sulfate IVPB 2 g 50 mL (0 g  Intravenous Stopped 04/16/24 0328)     ED COURSE: Second troponin still elevated but flat and slightly declining.  CT scans reviewed and interpreted by myself and the radiologist and show gallstones, diverticulosis but no malignancy or other acute abnormality.  Patient remains hemodynamically stable.  Will discuss with the hospitalist for admission.  Patient reports he is a full code.  CONSULTS:  Consulted and discussed patient's case with hospitalist, Dr. Cleatus.  I have recommended admission and consulting physician agrees and will place admission orders.  Patient (and family if present) agree with this plan.   I reviewed all nursing notes, vitals, pertinent previous records.  All labs, EKGs, imaging ordered have been independently reviewed and interpreted by myself.     OUTSIDE RECORDS REVIEWED: Reviewed last internal medicine notes in April 2025.  Hemoglobin at that time was 7.8.       FINAL CLINICAL IMPRESSION(S) / ED DIAGNOSES   Final diagnoses:  Symptomatic anemia  Upper GI bleed  Acute renal failure, unspecified acute renal failure type (HCC)  Alcohol use disorder  Hypokalemia  Hypomagnesemia  Cachectic (HCC)  Elevated troponin  Gallstones  Diverticulosis     Rx / DC Orders   ED Discharge Orders     None        Note:  This document was prepared using Dragon voice recognition software and may include unintentional dictation errors.   Arieonna Medine, Josette SAILOR, DO 04/16/24 [phone]

## 2024-04-15 NOTE — ED Triage Notes (Signed)
 Pt reports leg weakness for the past 2 weeks, pt reports he feels dehydrated but denies change in eating or drinking. Pt denies dysuria, cough congestion or fever.

## 2024-04-15 NOTE — ED Notes (Signed)
 No answer x1

## 2024-04-16 ENCOUNTER — Inpatient Hospital Stay

## 2024-04-16 ENCOUNTER — Inpatient Hospital Stay
Admit: 2024-04-16 | Discharge: 2024-04-16 | Disposition: A | Discharge status: Home / Self Care | Attending: Internal Medicine

## 2024-04-16 ENCOUNTER — Emergency Department

## 2024-04-16 DIAGNOSIS — R64 Cachexia: Secondary | ICD-10-CM | POA: Insufficient documentation

## 2024-04-16 DIAGNOSIS — E876 Hypokalemia: Secondary | ICD-10-CM | POA: Diagnosis not present

## 2024-04-16 DIAGNOSIS — M6259 Muscle wasting and atrophy, not elsewhere classified, multiple sites: Secondary | ICD-10-CM | POA: Diagnosis not present

## 2024-04-16 DIAGNOSIS — R7989 Other specified abnormal findings of blood chemistry: Secondary | ICD-10-CM | POA: Insufficient documentation

## 2024-04-16 DIAGNOSIS — M87874 Other osteonecrosis, right foot: Secondary | ICD-10-CM | POA: Diagnosis not present

## 2024-04-16 DIAGNOSIS — M898X7 Other specified disorders of bone, ankle and foot: Secondary | ICD-10-CM | POA: Diagnosis not present

## 2024-04-16 DIAGNOSIS — Z4781 Encounter for orthopedic aftercare following surgical amputation: Secondary | ICD-10-CM | POA: Diagnosis not present

## 2024-04-16 DIAGNOSIS — K921 Melena: Secondary | ICD-10-CM | POA: Diagnosis not present

## 2024-04-16 DIAGNOSIS — T39395A Adverse effect of other nonsteroidal anti-inflammatory drugs [NSAID], initial encounter: Secondary | ICD-10-CM

## 2024-04-16 DIAGNOSIS — E872 Acidosis, unspecified: Secondary | ICD-10-CM | POA: Diagnosis not present

## 2024-04-16 DIAGNOSIS — D631 Anemia in chronic kidney disease: Secondary | ICD-10-CM | POA: Diagnosis not present

## 2024-04-16 DIAGNOSIS — F1010 Alcohol abuse, uncomplicated: Secondary | ICD-10-CM | POA: Diagnosis not present

## 2024-04-16 DIAGNOSIS — D62 Acute posthemorrhagic anemia: Secondary | ICD-10-CM | POA: Diagnosis present

## 2024-04-16 DIAGNOSIS — K2960 Other gastritis without bleeding: Secondary | ICD-10-CM

## 2024-04-16 DIAGNOSIS — N179 Acute kidney failure, unspecified: Secondary | ICD-10-CM | POA: Diagnosis not present

## 2024-04-16 DIAGNOSIS — I2510 Atherosclerotic heart disease of native coronary artery without angina pectoris: Secondary | ICD-10-CM

## 2024-04-16 DIAGNOSIS — I110 Hypertensive heart disease with heart failure: Secondary | ICD-10-CM | POA: Diagnosis not present

## 2024-04-16 DIAGNOSIS — Z681 Body mass index (BMI) 19 or less, adult: Secondary | ICD-10-CM | POA: Diagnosis not present

## 2024-04-16 DIAGNOSIS — M86372 Chronic multifocal osteomyelitis, left ankle and foot: Secondary | ICD-10-CM | POA: Diagnosis not present

## 2024-04-16 DIAGNOSIS — E878 Other disorders of electrolyte and fluid balance, not elsewhere classified: Secondary | ICD-10-CM

## 2024-04-16 DIAGNOSIS — M86371 Chronic multifocal osteomyelitis, right ankle and foot: Secondary | ICD-10-CM | POA: Diagnosis not present

## 2024-04-16 DIAGNOSIS — K2921 Alcoholic gastritis with bleeding: Secondary | ICD-10-CM | POA: Diagnosis not present

## 2024-04-16 DIAGNOSIS — I10 Essential (primary) hypertension: Secondary | ICD-10-CM | POA: Diagnosis not present

## 2024-04-16 DIAGNOSIS — I2489 Other forms of acute ischemic heart disease: Secondary | ICD-10-CM | POA: Diagnosis not present

## 2024-04-16 DIAGNOSIS — L853 Xerosis cutis: Secondary | ICD-10-CM | POA: Diagnosis not present

## 2024-04-16 DIAGNOSIS — E785 Hyperlipidemia, unspecified: Secondary | ICD-10-CM | POA: Diagnosis not present

## 2024-04-16 DIAGNOSIS — E871 Hypo-osmolality and hyponatremia: Secondary | ICD-10-CM | POA: Diagnosis not present

## 2024-04-16 DIAGNOSIS — K861 Other chronic pancreatitis: Secondary | ICD-10-CM | POA: Diagnosis not present

## 2024-04-16 DIAGNOSIS — K760 Fatty (change of) liver, not elsewhere classified: Secondary | ICD-10-CM | POA: Diagnosis not present

## 2024-04-16 DIAGNOSIS — R460 Very low level of personal hygiene: Secondary | ICD-10-CM | POA: Diagnosis not present

## 2024-04-16 DIAGNOSIS — J449 Chronic obstructive pulmonary disease, unspecified: Secondary | ICD-10-CM | POA: Diagnosis not present

## 2024-04-16 DIAGNOSIS — R238 Other skin changes: Secondary | ICD-10-CM | POA: Diagnosis not present

## 2024-04-16 DIAGNOSIS — E43 Unspecified severe protein-calorie malnutrition: Secondary | ICD-10-CM | POA: Diagnosis not present

## 2024-04-16 DIAGNOSIS — D75838 Other thrombocytosis: Secondary | ICD-10-CM | POA: Diagnosis not present

## 2024-04-16 DIAGNOSIS — T182XXA Foreign body in stomach, initial encounter: Secondary | ICD-10-CM | POA: Diagnosis not present

## 2024-04-16 DIAGNOSIS — F17210 Nicotine dependence, cigarettes, uncomplicated: Secondary | ICD-10-CM | POA: Diagnosis not present

## 2024-04-16 DIAGNOSIS — Z7401 Bed confinement status: Secondary | ICD-10-CM | POA: Diagnosis not present

## 2024-04-16 DIAGNOSIS — E860 Dehydration: Secondary | ICD-10-CM | POA: Diagnosis not present

## 2024-04-16 DIAGNOSIS — I96 Gangrene, not elsewhere classified: Secondary | ICD-10-CM | POA: Diagnosis not present

## 2024-04-16 DIAGNOSIS — I5022 Chronic systolic (congestive) heart failure: Secondary | ICD-10-CM | POA: Diagnosis not present

## 2024-04-16 LAB — URINALYSIS, ROUTINE W REFLEX MICROSCOPIC
Bacteria, UA: NONE SEEN
Bilirubin Urine: NEGATIVE
Glucose, UA: NEGATIVE mg/dL
Ketones, ur: NEGATIVE mg/dL
Leukocytes,Ua: NEGATIVE
Nitrite: NEGATIVE
Protein, ur: NEGATIVE mg/dL
Specific Gravity, Urine: 1.009 (ref 1.005–1.030)
Squamous Epithelial / HPF: 0 /HPF (ref 0–5)
pH: 6 (ref 5.0–8.0)

## 2024-04-16 LAB — CBC WITH DIFFERENTIAL/PLATELET
Abs Immature Granulocytes: 0.04 K/uL (ref 0.00–0.07)
Basophils Absolute: 0 K/uL (ref 0.0–0.1)
Basophils Relative: 0 %
Eosinophils Absolute: 0 K/uL (ref 0.0–0.5)
Eosinophils Relative: 0 %
HCT: 35.4 % — ABNORMAL LOW (ref 39.0–52.0)
Hemoglobin: 11.6 g/dL — ABNORMAL LOW (ref 13.0–17.0)
Immature Granulocytes: 0 %
Lymphocytes Relative: 13 %
Lymphs Abs: 1.2 K/uL (ref 0.7–4.0)
MCH: 27.9 pg (ref 26.0–34.0)
MCHC: 32.8 g/dL (ref 30.0–36.0)
MCV: 85.1 fL (ref 80.0–100.0)
Monocytes Absolute: 1 K/uL (ref 0.1–1.0)
Monocytes Relative: 11 %
Neutro Abs: 7 K/uL (ref 1.7–7.7)
Neutrophils Relative %: 76 %
Platelets: 289 K/uL (ref 150–400)
RBC: 4.16 MIL/uL — ABNORMAL LOW (ref 4.22–5.81)
RDW: 16.8 % — ABNORMAL HIGH (ref 11.5–15.5)
WBC: 9.3 K/uL (ref 4.0–10.5)
nRBC: 0 % (ref 0.0–0.2)

## 2024-04-16 LAB — HEMOGLOBIN
Hemoglobin: 11.5 g/dL — ABNORMAL LOW (ref 13.0–17.0)
Hemoglobin: 11.2 g/dL — ABNORMAL LOW (ref 13.0–17.0)

## 2024-04-16 LAB — ECHOCARDIOGRAM COMPLETE
AR max vel: 1.47 cm2
AV Area VTI: 1.57 cm2
AV Area mean vel: 1.42 cm2
AV Mean grad: 2.5 mmHg
AV Peak grad: 4.6 mmHg
Ao pk vel: 1.08 m/s
Area-P 1/2: 3.53 cm2
Height: 69 in
S' Lateral: 4.2 cm
Single Plane A4C EF: 39.4 %
Weight: 1632 [oz_av]

## 2024-04-16 LAB — URINE DRUG SCREEN, QUALITATIVE (ARMC ONLY)
Amphetamines, Ur Screen: NOT DETECTED
Barbiturates, Ur Screen: NOT DETECTED
Benzodiazepine, Ur Scrn: NOT DETECTED
Cannabinoid 50 Ng, Ur ~~LOC~~: NOT DETECTED
Cocaine Metabolite,Ur ~~LOC~~: NOT DETECTED
MDMA (Ecstasy)Ur Screen: NOT DETECTED
Methadone Scn, Ur: NOT DETECTED
Opiate, Ur Screen: NOT DETECTED
Phencyclidine (PCP) Ur S: NOT DETECTED
Tricyclic, Ur Screen: NOT DETECTED

## 2024-04-16 LAB — ETHANOL: Alcohol, Ethyl (B): <15 mg/dL (ref <15)

## 2024-04-16 LAB — CBG MONITORING, ED: Glucose-Capillary: 81 mg/dL (ref 70–99)

## 2024-04-16 LAB — ABO/RH: ABO/RH(D): A POS

## 2024-04-16 LAB — BASIC METABOLIC PANEL WITH GFR
Anion gap: 16 — ABNORMAL HIGH (ref 5–15)
BUN: 73 mg/dL — ABNORMAL HIGH (ref 8–23)
CO2: 14 mmol/L — ABNORMAL LOW (ref 22–32)
Calcium: 7.3 mg/dL — ABNORMAL LOW (ref 8.9–10.3)
Chloride: 104 mmol/L (ref 98–111)
Creatinine, Ser: 5.63 mg/dL — ABNORMAL HIGH (ref 0.61–1.24)
GFR, Estimated: 11 mL/min — ABNORMAL LOW (ref >60)
Glucose, Bld: 110 mg/dL — ABNORMAL HIGH (ref 70–99)
Potassium: 4.6 mmol/L (ref 3.5–5.1)
Sodium: 134 mmol/L — ABNORMAL LOW (ref 135–145)

## 2024-04-16 LAB — PROTIME-INR
INR: 1.3 — ABNORMAL HIGH (ref 0.8–1.2)
Prothrombin Time: 17 s — ABNORMAL HIGH (ref 11.4–15.2)

## 2024-04-16 LAB — MAGNESIUM: Magnesium: 1.4 mg/dL — ABNORMAL LOW (ref 1.7–2.4)

## 2024-04-16 LAB — PREPARE RBC (CROSSMATCH)

## 2024-04-16 LAB — TROPONIN I (HIGH SENSITIVITY): Troponin I (High Sensitivity): 255 ng/L (ref <18)

## 2024-04-16 MED ORDER — LORAZEPAM 2 MG/ML IJ SOLN: 1.0000 mg | INTRAMUSCULAR | Status: AC | PRN

## 2024-04-16 MED ORDER — THIAMINE MONONITRATE 100 MG PO TABS
100.0000 mg | ORAL_TABLET | Freq: Every day | ORAL | Status: DC
  Administered 2024-04-16 – 2024-04-30 (×12): 100 mg via ORAL
  Filled 2024-04-16 (×14): qty 1

## 2024-04-16 MED ORDER — SODIUM CHLORIDE 0.9% FLUSH
3.0000 mL | Freq: Two times a day (BID) | INTRAVENOUS | Status: DC
  Administered 2024-04-16 – 2024-04-29 (×22): 3 mL via INTRAVENOUS

## 2024-04-16 MED ORDER — BLOOD PRESSURE CONTROL BOOK: Freq: Once | Status: DC

## 2024-04-16 MED ORDER — ACETAMINOPHEN 325 MG PO TABS
650.0000 mg | ORAL_TABLET | Freq: Four times a day (QID) | ORAL | Status: DC | PRN
  Administered 2024-04-19: 650 mg via ORAL
  Filled 2024-04-16: qty 2

## 2024-04-16 MED ORDER — SODIUM CHLORIDE 0.9 % IV SOLN
50.0000 ug/h | INTRAVENOUS | Status: DC
  Administered 2024-04-16 (×3): 50 ug/h via INTRAVENOUS
  Filled 2024-04-16 (×4): qty 1

## 2024-04-16 MED ORDER — OCTREOTIDE LOAD VIA INFUSION
50.0000 ug | Freq: Once | INTRAVENOUS | Status: AC
  Administered 2024-04-16: 50 ug via INTRAVENOUS
  Filled 2024-04-16: qty 25

## 2024-04-16 MED ORDER — ADULT MULTIVITAMIN W/MINERALS CH
1.0000 | ORAL_TABLET | Freq: Every day | ORAL | Status: DC
  Administered 2024-04-16 – 2024-04-30 (×14): 1 via ORAL
  Filled 2024-04-16 (×14): qty 1

## 2024-04-16 MED ORDER — FOLIC ACID 1 MG PO TABS
1.0000 mg | ORAL_TABLET | Freq: Every day | ORAL | Status: DC
  Administered 2024-04-16 – 2024-04-30 (×14): 1 mg via ORAL
  Filled 2024-04-16 (×14): qty 1

## 2024-04-16 MED ORDER — SODIUM CHLORIDE 0.9 % IV SOLN
INTRAVENOUS | Status: DC
Start: 2024-04-16 — End: 2024-04-17

## 2024-04-16 MED ORDER — ONDANSETRON HCL 4 MG/2ML IJ SOLN: 4.0000 mg | Freq: Four times a day (QID) | INTRAMUSCULAR | Status: DC | PRN

## 2024-04-16 MED ORDER — ACETAMINOPHEN 650 MG RE SUPP: 650.0000 mg | Freq: Four times a day (QID) | RECTAL | Status: DC | PRN

## 2024-04-16 MED ORDER — NICOTINE 21 MG/24HR TD PT24
21.0000 mg | MEDICATED_PATCH | Freq: Every day | TRANSDERMAL | Status: DC
  Administered 2024-04-16 – 2024-04-30 (×15): 21 mg via TRANSDERMAL
  Filled 2024-04-16 (×15): qty 1

## 2024-04-16 MED ORDER — MAGNESIUM SULFATE 2 GM/50ML IV SOLN
2.0000 g | Freq: Once | INTRAVENOUS | Status: AC
  Administered 2024-04-16: 2 g via INTRAVENOUS
  Filled 2024-04-16: qty 50

## 2024-04-16 MED ORDER — PANTOPRAZOLE SODIUM 40 MG IV SOLR
40.0000 mg | Freq: Two times a day (BID) | INTRAVENOUS | Status: DC
  Administered 2024-04-16 – 2024-04-22 (×14): 40 mg via INTRAVENOUS
  Filled 2024-04-16 (×14): qty 10

## 2024-04-16 MED ORDER — THIAMINE HCL 100 MG/ML IJ SOLN
100.0000 mg | Freq: Every day | INTRAMUSCULAR | Status: DC
  Administered 2024-04-20 – 2024-04-24 (×2): 100 mg via INTRAVENOUS
  Filled 2024-04-16 (×4): qty 2

## 2024-04-16 MED ORDER — LORAZEPAM 1 MG PO TABS
1.0000 mg | ORAL_TABLET | ORAL | Status: AC | PRN
  Administered 2024-04-18 (×2): 2 mg via ORAL
  Filled 2024-04-16 (×2): qty 2

## 2024-04-16 MED ORDER — ONDANSETRON HCL 4 MG PO TABS: 4.0000 mg | ORAL_TABLET | Freq: Four times a day (QID) | ORAL | Status: DC | PRN

## 2024-04-16 NOTE — ED Notes (Signed)
Pt assisted to use the urinal 

## 2024-04-16 NOTE — ED Notes (Signed)
 Pt resting quietly at this time, no obvious distress noted

## 2024-04-16 NOTE — Assessment & Plan Note (Signed)
 Given thiamine  folate and multivitamin in the ED CIWA withdrawal protocol

## 2024-04-16 NOTE — ED Notes (Signed)
 Echocaradiogram tech in room with pt

## 2024-04-16 NOTE — ED Notes (Signed)
 Echo at bedside

## 2024-04-16 NOTE — Assessment & Plan Note (Signed)
 Nicotine  patch

## 2024-04-16 NOTE — Assessment & Plan Note (Signed)
 Suspect severe protein calorie malnutrition related to alcohol use disorder BMI 15 Keeping n.p.o. Nutritionist evaluation

## 2024-04-16 NOTE — Assessment & Plan Note (Signed)
 Hypokalemia, hypomagnesemia and hyponatremia Likely related to hypovolemia, hypotension, alcohol use Pharmacy consulted for assistance with electrolyte management

## 2024-04-16 NOTE — Plan of Care (Signed)

## 2024-04-16 NOTE — Consult Note (Addendum)
 [facility]  CONSULT NOTE    Date: 04/16/2024                  Patient Name:  Bernard Harris  MRN: [id number]  DOB: [birth year]  Age / Sex: 63 y.o., male         PCP: Bernard Lenis, MD                 Service Requesting Consult: TRH                 Reason for Consult: Acute kidney injury            History of Present Illness: Mr. Bernard Harris is a 63 y.o.  male with past medical conditions including tobacco and nicotine  abuse, CAD, hypertension, COPD, who was admitted to [facility] on 04/15/2024 for Hypokalemia [E87.6] Hypomagnesemia [E83.42] Acute blood loss anemia [D62] Cachectic (HCC) [R64] Diverticulosis [K57.90] Gallstones [K80.20] Upper GI bleed [K92.2] Elevated troponin [R79.89] Symptomatic anemia [D64.9] Acute renal failure, unspecified acute renal failure type (HCC) [N17.9] Alcohol use disorder [F10.90]  Patient presents to the emergency department with leg pain and weakness.  He states this began roughly 2 weeks ago.  Patient states he fell off a ladder about 3 weeks ago with another family member.  Patient states he began taking ibuprofen 7 tabs twice daily.  States his family member was taking this amount, so he followed suit.  Labs on ED arrival concerning for sodium 130, potassium 3.2, serum bicarb 13, BUN 79, creatinine 6.44 with GFR 9, magnesium  1.4, calcium 7.9, and hemoglobin 5.4.  Troponin 277.  CT chest abdomen pelvis negative for acute findings, negative for renal obstruction.  Echo shows EF 40 to 45% with a mild to moderate MVR, mild AVR.  Urine tox screen negative.  Patient has received IV fluids in emergency department with mild correction of the labs.   Medications: Outpatient medications: Medications Prior to Admission  Medication Sig Dispense Refill Last Dose/Taking   acamprosate (CAMPRAL) 333 MG tablet Take 666 mg by mouth 3 (three) times daily with meals.   Unknown   amLODipine (NORVASC) 5 MG tablet Take 5 mg by  mouth daily.   04/13/2024   aspirin EC 81 MG tablet Take 81 mg by mouth daily.   04/13/2024   cetirizine (ZYRTEC) 10 MG tablet Take 10 mg by mouth daily as needed for allergies.   Unknown   cyanocobalamin  1000 MCG tablet Take by mouth.   04/13/2024   fluticasone (FLONASE) 50 MCG/ACT nasal spray Place 2 sprays into both nostrils daily as needed for allergies.   Unknown   lisinopril (PRINIVIL,ZESTRIL) 20 MG tablet Take 20 mg by mouth daily.    04/13/2024   magnesium  oxide (MAG-OX) 400 MG tablet Take 400 mg by mouth daily.   04/13/2024   metoprolol tartrate (LOPRESSOR) 50 MG tablet Take 50 mg by mouth 2 (two) times daily.   04/13/2024   nitroGLYCERIN (NITROSTAT) 0.4 MG SL tablet Place 0.4 mg under the tongue every 5 (five) minutes as needed for chest pain.   Unknown   Omega-3 Fatty Acids (FISH OIL) 1200 MG CAPS Take 1,200 mg by mouth daily.    04/13/2024   omeprazole (PRILOSEC) 20 MG capsule Take 20 mg by mouth daily.   04/13/2024   simvastatin (ZOCOR) 20 MG tablet Take 20 mg by mouth at bedtime.    04/13/2024   oxyCODONE -acetaminophen  (PERCOCET) 5-325 MG tablet Take 1 tablet by mouth every 4 (four)  hours as needed. (Patient not taking: Reported on 04/16/2024) 12 tablet 0 Not Taking    Current medications: Current Facility-Administered Medications  Medication Dose Route Frequency Provider Last Rate Last Admin   0.9 %  sodium chloride  infusion   Intravenous Continuous Ward, Kristen N, DO 125 mL/hr at 04/16/24 1313 New Bag at 04/16/24 1313   acetaminophen  (TYLENOL ) tablet 650 mg  650 mg Oral Q6H PRN Duncan, Hazel V, MD       Or   acetaminophen  (TYLENOL ) suppository 650 mg  650 mg Rectal Q6H PRN Cleatus Delayne GAILS, MD       blood pressure control book   Does not apply Once Cleatus Delayne GAILS, MD       folic acid  (FOLVITE ) tablet 1 mg  1 mg Oral Daily Cleatus Delayne V, MD   1 mg at 04/16/24 1151   LORazepam  (ATIVAN ) tablet 1-4 mg  1-4 mg Oral Q1H PRN Cleatus Delayne GAILS, MD       Or   LORazepam  (ATIVAN ) injection 1-4 mg  1-4  mg Intravenous Q1H PRN Duncan, Hazel V, MD       multivitamin with minerals tablet 1 tablet  1 tablet Oral Daily Cleatus Delayne GAILS, MD   1 tablet at 04/16/24 1151   nicotine  (NICODERM CQ  - dosed in mg/24 hours) patch 21 mg  21 mg Transdermal Daily Cleatus Delayne V, MD   21 mg at 04/16/24 1202   octreotide  (SANDOSTATIN ) 500 mcg in sodium chloride  0.9 % 250 mL (2 mcg/mL) infusion  50 mcg/hr Intravenous Continuous Cleatus Delayne GAILS, MD 25 mL/hr at 04/16/24 1315 50 mcg/hr at 04/16/24 1315   ondansetron  (ZOFRAN ) tablet 4 mg  4 mg Oral Q6H PRN Duncan, Hazel V, MD       Or   ondansetron  (ZOFRAN ) injection 4 mg  4 mg Intravenous Q6H PRN Duncan, Hazel V, MD       pantoprazole  (PROTONIX ) injection 40 mg  40 mg Intravenous Q12H Cleatus Delayne V, MD   40 mg at 04/16/24 1155   sodium chloride  flush (NS) 0.9 % injection 3 mL  3 mL Intravenous Q12H Cleatus Delayne V, MD   3 mL at 04/16/24 1155   thiamine  (VITAMIN B1) tablet 100 mg  100 mg Oral Daily Cleatus Delayne V, MD   100 mg at 04/16/24 1151   Or   thiamine  (VITAMIN B1) injection 100 mg  100 mg Intravenous Daily Cleatus Delayne GAILS, MD          Allergies: No Known Allergies    Past Medical History: Past Medical History:  Diagnosis Date   COPD (chronic obstructive pulmonary disease) (HCC)    Coronary artery disease    Current every day smoker    Dyspnea    with exertion   GERD (gastroesophageal reflux disease)    Heart attack (HCC)    X 2   Hyperlipidemia    Hypertension      Past Surgical History: Past Surgical History:  Procedure Laterality Date   CARDIAC CATHETERIZATION     COLONOSCOPY N/A 01/09/2022   Procedure: COLONOSCOPY;  Surgeon: Toledo, Ladell POUR, MD;  Location: [facility] ENDOSCOPY;  Service: Gastroenterology;  Laterality: N/A;   CORONARY STENT PLACEMENT     FOOT SURGERY Bilateral 1980's     Family History: History reviewed. No pertinent family history.   Social History: Social History   Socioeconomic History   Marital status: Single     Spouse name: Not on file   Number of children: Not  on file   Years of education: Not on file   Highest education level: Not on file  Occupational History   Not on file  Tobacco Use   Smoking status: Every Day    Current packs/day: 0.25    Average packs/day: 0.3 packs/day for 48.0 years (12.0 ttl pk-yrs)    Types: Cigarettes   Smokeless tobacco: Never  Vaping Use   Vaping status: Never Used  Substance and Sexual Activity   Alcohol use: Yes    Alcohol/week: 3.0 - 5.0 standard drinks of alcohol    Types: 2 - 3 Cans of beer, 1 - 2 Shots of liquor per week    Comment: daily   Drug use: No   Sexual activity: Not on file  Other Topics Concern   Not on file  Social History Narrative   Not on file   Social Drivers of Health   Financial Resource Strain: Patient Declined (11/04/2023)   Received from [facility]   Overall Financial Resource Strain (CARDIA)    Difficulty of Paying Living Expenses: Patient declined  Food Insecurity: Patient Declined (11/04/2023)   Received from [facility]   Hunger Vital Sign    Within the past 12 months, you worried that your food would run out before you got the money to buy more.: Patient declined    Within the past 12 months, the food you bought just didn't last and you didn't have money to get more.: Patient declined  Transportation Needs: Patient Declined (11/04/2023)   Received from [facility] - Transportation    In the past 12 months, has lack of transportation kept you from medical appointments or from getting medications?: Patient declined    Lack of Transportation (Non-Medical): Patient declined  Physical Activity: Not on file  Stress: Not on file  Social Connections: Not on file  Intimate Partner Violence: Not on file     Review of Systems: Review of Systems  Constitutional:  Negative for chills, fever and malaise/fatigue.  HENT:  Negative for congestion, sore throat and  tinnitus.   Eyes:  Negative for blurred vision and redness.  Respiratory:  Negative for cough, shortness of breath and wheezing.   Cardiovascular:  Negative for chest pain, palpitations, claudication and leg swelling.  Gastrointestinal:  Negative for abdominal pain, blood in stool, diarrhea, nausea and vomiting.  Genitourinary:  Negative for flank pain, frequency and hematuria.  Musculoskeletal:  Positive for joint pain. Negative for back pain, falls and myalgias.  Skin:  Negative for rash.  Neurological:  Positive for weakness. Negative for dizziness and headaches.  Endo/Heme/Allergies:  Does not bruise/bleed easily.  Psychiatric/Behavioral:  Negative for depression. The patient is not nervous/anxious and does not have insomnia.     Vital Signs: Blood pressure 108/73, pulse 69, temperature 97.8 F (36.6 C), temperature source Oral, resp. rate 16, height 5' 9 (1.753 m), weight 46.3 kg, SpO2 100%.  Weight trends: Filed Weights   04/15/24 2029  Weight: 46.3 kg    Physical Exam: General: NAD, pleasant demeanor  Head: Normocephalic, atraumatic. Moist oral mucosal membranes  Eyes: Anicteric  Neck: Supple  Lungs:  Clear to auscultation, normal effort  Heart: Regular rate and rhythm  Abdomen:  Soft, nontender, nondistended  Extremities: No peripheral edema.  Neurologic: Awake and alert  Skin: No lesions  Access: None     Lab results: Basic Metabolic Panel: Recent Labs  Lab 04/15/24 2033 04/16/24 1258  NA 130* 134*  K 3.2* 4.6  CL 97* 104  CO2 16* 14*  GLUCOSE 133* 110*  BUN 79* 73*  CREATININE 6.44* 5.63*  CALCIUM 7.9* 7.3*  MG 1.4*  --     Liver Function Tests: Recent Labs  Lab 04/15/24 2033  AST 29  ALT 15  ALKPHOS 69  BILITOT 1.0  PROT 7.9  ALBUMIN 3.5   No results for input(s): LIPASE, AMYLASE in the last 168 hours. No results for input(s): AMMONIA in the last 168 hours.  CBC: Recent Labs  Lab 04/15/24 2033 04/16/24 1258  WBC 9.0 9.3   NEUTROABS  --  7.0  HGB 5.4* 11.6*  HCT 17.3* 35.4*  MCV 79.0* 85.1  PLT 465* 289    Cardiac Enzymes: No results for input(s): CKTOTAL, CKMB, CKMBINDEX, TROPONINI in the last 168 hours.  BNP: Invalid input(s): POCBNP  CBG: Recent Labs  Lab 04/16/24 1209  GLUCAP 81    Microbiology: No results found for this or any previous visit.  Coagulation Studies: Recent Labs    04/16/24 0005  LABPROT 17.0*  INR 1.3*    Urinalysis: Recent Labs    04/16/24 1025  COLORURINE YELLOW*  LABSPEC 1.009  PHURINE 6.0  GLUCOSEU NEGATIVE  HGBUR SMALL*  BILIRUBINUR NEGATIVE  KETONESUR NEGATIVE  PROTEINUR NEGATIVE  NITRITE NEGATIVE  LEUKOCYTESUR NEGATIVE      Imaging: ECHOCARDIOGRAM COMPLETE Result Date: 04/16/2024    ECHOCARDIOGRAM REPORT   Patient Name:   ELIN SEATS Date of Exam: 04/16/2024 Medical Rec #:  [id number]             Height:       69.0 in Accession #:    [id number]            Weight:       102.0 lb Date of Birth:  May 25, 1961              BSA:          1.552 m Patient Age:    63 years              BP:           112/67 mmHg Patient Gender: M                     HR:           64 bpm. Exam Location:  [facility] Procedure: 2D Echo, Cardiac Doppler and Color Doppler (Both Spectral and Color            Flow Doppler were utilized during procedure). Indications:     Elevated troponin  History:         Patient has no prior history of Echocardiogram examinations.                  Previous Myocardial Infarction, COPD, Signs/Symptoms:Dyspnea;                  Risk Factors:Hypertension.  Sonographer:     Christopher Furnace Referring Phys:  [id number] DELAYNE LULLA SOLIAN Diagnosing Phys: Marsa Dooms MD  Sonographer Comments: Image acquisition challenging due to patient body habitus. IMPRESSIONS  1. Left ventricular ejection fraction, by estimation, is 40 to 45%. The left ventricle has mildly decreased function. The left ventricle has no regional wall motion abnormalities. Left ventricular  diastolic parameters were normal.  2. Right ventricular systolic function is normal. The right ventricular size is normal.  3. The mitral valve is normal in structure. Mild to moderate mitral  valve regurgitation. No evidence of mitral stenosis.  4. The aortic valve is normal in structure. Aortic valve regurgitation is mild. No aortic stenosis is present.  5. The inferior vena cava is normal in size with greater than 50% respiratory variability, suggesting right atrial pressure of 3 mmHg. FINDINGS  Left Ventricle: Left ventricular ejection fraction, by estimation, is 40 to 45%. The left ventricle has mildly decreased function. The left ventricle has no regional wall motion abnormalities. Strain was performed and the global longitudinal strain is indeterminate. The left ventricular internal cavity size was normal in size. There is no left ventricular hypertrophy. Left ventricular diastolic parameters were normal. Right Ventricle: The right ventricular size is normal. No increase in right ventricular wall thickness. Right ventricular systolic function is normal. Left Atrium: Left atrial size was normal in size. Right Atrium: Right atrial size was normal in size. Pericardium: There is no evidence of pericardial effusion. Mitral Valve: The mitral valve is normal in structure. Mild to moderate mitral valve regurgitation. No evidence of mitral valve stenosis. Tricuspid Valve: The tricuspid valve is normal in structure. Tricuspid valve regurgitation is mild . No evidence of tricuspid stenosis. Aortic Valve: The aortic valve is normal in structure. Aortic valve regurgitation is mild. No aortic stenosis is present. Aortic valve mean gradient measures 2.5 mmHg. Aortic valve peak gradient measures 4.6 mmHg. Aortic valve area, by VTI measures 1.57 cm. Pulmonic Valve: The pulmonic valve was normal in structure. Pulmonic valve regurgitation is not visualized. No evidence of pulmonic stenosis. Aorta: The aortic root is normal in  size and structure. Venous: The inferior vena cava is normal in size with greater than 50% respiratory variability, suggesting right atrial pressure of 3 mmHg. IAS/Shunts: No atrial level shunt detected by color flow Doppler. Additional Comments: 3D was performed not requiring image post processing on an independent workstation and was abnormal.  LEFT VENTRICLE PLAX 2D LVIDd:         5.10 cm      Diastology LVIDs:         4.20 cm      LV e' medial:    9.57 cm/s LV PW:         1.00 cm      LV E/e' medial:  8.8 LV IVS:        1.00 cm      LV e' lateral:   14.00 cm/s LVOT diam:     2.20 cm      LV E/e' lateral: 6.0 LV SV:         32 LV SV Index:   21 LVOT Area:     3.80 cm  LV Volumes (MOD) LV vol d, MOD A4C: 122.0 ml LV vol s, MOD A4C: 73.9 ml LV SV MOD A4C:     122.0 ml RIGHT VENTRICLE RV Basal diam:  3.70 cm RV Mid diam:    4.30 cm RV S prime:     9.36 cm/s TAPSE (M-mode): 2.2 cm LEFT ATRIUM             Index        RIGHT ATRIUM           Index LA diam:        3.30 cm 2.13 cm/m   RA Area:     13.40 cm LA Vol (A2C):   80.5 ml 51.87 ml/m  RA Volume:   33.70 ml  21.72 ml/m LA Vol (A4C):   43.3 ml 27.90 ml/m LA Biplane Vol: 58.8  ml 37.89 ml/m  AORTIC VALVE AV Area (Vmax):    1.47 cm AV Area (Vmean):   1.42 cm AV Area (VTI):     1.57 cm AV Vmax:           107.50 cm/s AV Vmean:          73.050 cm/s AV VTI:            0.206 m AV Peak Grad:      4.6 mmHg AV Mean Grad:      2.5 mmHg LVOT Vmax:         41.50 cm/s LVOT Vmean:        27.300 cm/s LVOT VTI:          0.085 m LVOT/AV VTI ratio: 0.41  AORTA Ao Root diam: 2.90 cm MITRAL VALVE               TRICUSPID VALVE MV Area (PHT): 3.53 cm    TR Peak grad:   44.6 mmHg MV Decel Time: 215 msec    TR Vmax:        334.00 cm/s MV E velocity: 84.40 cm/s MV A velocity: 56.10 cm/s  SHUNTS MV E/A ratio:  1.50        Systemic VTI:  0.08 m                            Systemic Diam: 2.20 cm Marsa Dooms MD Electronically signed by Marsa Dooms MD Signature Date/Time:  04/16/2024/12:57:44 PM    Final    US  RENAL Result Date: 04/16/2024 CLINICAL DATA:  [id number] Acute kidney failure (HCC) [id number] EXAM: RENAL / URINARY TRACT ULTRASOUND COMPLETE COMPARISON:  04/16/2024 CT of the chest, abdomen, and pelvis FINDINGS: Right Kidney: Renal measurements: 8.9 x 4.4 x 5.4 cm = volume: 110 mL.Normal echogenicity. No mass. No hydronephrosis or nephrolithiasis. Left Kidney: Renal measurements: 9.1 x 4.9 x 3.7 cm = volume: 85 mL. Normal echogenicity. No mass. No hydronephrosis or nephrolithiasis. Bladder: Appears normal for degree of bladder distention. Other: None. IMPRESSION: No hydronephrosis or nephrolithiasis. Electronically Signed   By: Rogelia Myers M.D.   On: 04/16/2024 12:26   CT CHEST ABDOMEN PELVIS WO CONTRAST Result Date: 04/16/2024 EXAM: CT CHEST, ABDOMEN AND PELVIS WITHOUT CONTRAST 04/16/2024 12:34:20 AM TECHNIQUE: CT of the chest, abdomen and pelvis was performed without the administration of intravenous contrast. Multiplanar reformatted images are provided for review. Automated exposure control, iterative reconstruction, and/or weight based adjustment of the mA/kV was utilized to reduce the radiation dose to as low as reasonably achievable. COMPARISON: 09/08/2022 CLINICAL HISTORY: Eval for malignancy. Pt reports leg weakness for the past 2 weeks, pt reports he feels dehydrated but denies change in eating or drinking. Pt denies dysuria, cough congestion or fever. FINDINGS: CHEST: MEDIASTINUM AND LYMPH NODES: Heart and pericardium are unremarkable. The central airways are clear. No mediastinal, hilar or axillary lymphadenopathy. LUNGS AND PLEURA: No focal consolidation or pulmonary edema. No pleural effusion or pneumothorax. Emphysema. ABDOMEN AND PELVIS: LIVER: Hepatic steatosis. GALLBLADDER AND BILE DUCTS: Cholelithiasis. No evidence of acute cholecystitis. SPLEEN: No acute abnormality. PANCREAS: Pancreatic calcifications compatible with sequelae of chronic pancreatitis.  ADRENAL GLANDS: No acute abnormality. KIDNEYS, URETERS AND BLADDER: No stones in the kidneys or ureters. No hydronephrosis. No perinephric or periureteral stranding. Urinary bladder is unremarkable. GI AND BOWEL: Stomach demonstrates no acute abnormality. There is no bowel obstruction. Diverticulosis without evidence of diverticulitis. Normal appendix. REPRODUCTIVE ORGANS: No acute abnormality.  PERITONEUM AND RETROPERITONEUM: No ascites. No free air. VASCULATURE: Coronary artery and aortic atherosclerotic calcification. Aorta is normal in caliber. Low-density blood pool compatible with anemia. ABDOMINAL AND PELVIS LYMPH NODES: No lymphadenopathy. REPRODUCTIVE ORGANS: No acute abnormality. BONES AND SOFT TISSUES: No acute osseous abnormality. No focal soft tissue abnormality. Bilateral femoral head AVN. IMPRESSION: 1. No acute findings. 2. Emphysema. 3. Hepatic steatosis. 4. Pancreatic calcifications compatible with sequelae of chronic pancreatitis. 5. Cholelithiasis without evidence of acute cholecystitis. 6. Diverticulosis without evidence of diverticulitis. 7. Bilateral femoral head AVN. Electronically signed by: Norman Gatlin MD 04/16/2024 12:54 AM EDT RP Workstation: [id number]     Assessment & Plan: Mr. Damarie Schoolfield is a 63 y.o.  male with , who was admitted to [facility] on 04/15/2024 for Hypokalemia [E87.6] Hypomagnesemia [E83.42] Acute blood loss anemia [D62] Cachectic (HCC) [R64] Diverticulosis [K57.90] Gallstones [K80.20] Upper GI bleed [K92.2] Elevated troponin [R79.89] Symptomatic anemia [D64.9] Acute renal failure, unspecified acute renal failure type (HCC) [N17.9] Alcohol use disorder [F10.90]  Acute kidney injury secondary to excessive NSAID use.  Normal function noted in April of this year.  Creatinine 6.4 on ED arrival.  Patient reports 1400 mg of ibuprofen twice daily for at least 3 weeks.  CT abdomen chest pelvis negative for obstruction.  Will obtain renal ultrasound to  evaluate baseline renal anatomy.  Agree with IV hydration, will decrease rate to normal saline at 50 mL/h due to heart failure.  No acute indication for dialysis.  Continue to avoid all NSAIDs.  Will continue to monitor.  2.  Hypokalemia/hyponatremia/hypomagnesia likely secondary to kidney injury.  Patient has received IV supplementation of each electrolyte.  Potassium has corrected to 4.6.  Sodium improved to 134.  Awaiting updated magnesium  level.  3. Anemia due to acute blood loss, recent NSAID use.  Hemoglobin 5.4 on admission.  GI has been consulted and will perform EGD in AM.  4.  Chronic systolic heart failure, echo completed today shows EF 40 to 45% with a mild to moderate MVR and mild AVR.    LOS: 0 Habeeb Puertas 9/12/20253:51 PM

## 2024-04-16 NOTE — Hospital Course (Signed)
 Bernard Harris

## 2024-04-16 NOTE — Assessment & Plan Note (Addendum)
 Elevated troponin Troponin elevated in the 200s but downtrending, likely supply/demand mismatch Will hold aspirin due to bleeding Hold metoprolol, lisinopril, nitro due to hypotension Will get echo to evaluate for wall motion abnormality in view of history

## 2024-04-16 NOTE — Progress Notes (Signed)
*  PRELIMINARY RESULTS* Echocardiogram 2D Echocardiogram has been performed.  Floydene Harder 04/16/2024, 11:50 AM

## 2024-04-16 NOTE — Assessment & Plan Note (Addendum)
 NSAID induced gastropathy/alcoholic gastritis Acute on likely chronic blood loss anemia  Hypotension secondary to hypovolemia/acute blood loss hemoglobin of 5.4, with most recent of 7.8 back in April 2025. Continue transfusion of 3 units PRBCs IV Protonix  and octreotide  Close hemodynamic monitoring Serial H&H GI consult N.p.o. for likely endoscopy in the a.m. Patient is at high risk for clinical deterioration

## 2024-04-16 NOTE — H&P (Addendum)
 History and Physical    Patient: Bernard Harris FMW:[id number] DOB: 04/28/1961 DOA: 04/15/2024 DOS: the patient was seen and examined on 04/16/2024 PCP: Glover Lenis, MD  Patient coming from: Home  Chief Complaint:  Chief Complaint  Patient presents with   Weakness    HPI: Bernard Harris is a 63 y.o. male with medical history significant for COPD, tobacco use disorder, COPD, HTN, CAD with history of stent 2011, being admitted with melena and acute blood loss anemia(Hb 5.4) as well as severe AKI(creatinine 6.44) believe NSAID related.  Patient presented with 2 weeks of fatigue and worsening dyspnea on exertion.  He has noted black stool for the past week.  Denies abdominal pain,. emesis.  Denies chest pain. Reports he has been having knee pain from a fall 3 weeks ago and has been using ibuprofen up to 7 tablets twice a day based on what a friend told him his doctor prescribed for similar problem. Endorses drinking anywhere from 1-5 drinks of vodka daily but has not done so recently due to the weakness(states half gallon of vodka lasts 2 to 3 weeks) In the ED, vitals initially within normal limits however last night progressed BP started trending down to as low as 78/52, improving somewhat with fluids. Labs notable for hemoglobin of 5.4, with most recent of 7.8 back in April 2025. Creatinine noted to be 6.44 most recently 0.7 in April  Bicarb of 16, anion gap of 17, normal LFTs Sodium 130 and potassium 3.2, magnesium  1.4 EtOH less than 15 Troponin 277--255 UDS pending EKG showing NSR at 66 with nonspecific ST-T wave changes CT chest abdomen pelvis without with no acute findings.  Does show hepatic steatosis, cholelithiasis without cholecystitis, pancreatic calcifications compatible with chronic pancreatitis.  Patient typed and crossed and started on 1 of 3 units PRBCs, given an NS bolus, IV mag repletion, IV Protonix  80 mg. Admission requested    Past Medical History:   Diagnosis Date   COPD (chronic obstructive pulmonary disease) (HCC)    Coronary artery disease    Current every day smoker    Dyspnea    with exertion   GERD (gastroesophageal reflux disease)    Heart attack (HCC)    X 2   Hyperlipidemia    Hypertension    Past Surgical History:  Procedure Laterality Date   CARDIAC CATHETERIZATION     COLONOSCOPY N/A 01/09/2022   Procedure: COLONOSCOPY;  Surgeon: Toledo, Ladell POUR, MD;  Location: [facility] ENDOSCOPY;  Service: Gastroenterology;  Laterality: N/A;   CORONARY STENT PLACEMENT     FOOT SURGERY Bilateral 1980's   Social History:  reports that he has been smoking cigarettes. He has a 12 pack-year smoking history. He has never used smokeless tobacco. He reports current alcohol use of about 3.0 - 5.0 standard drinks of alcohol per week. He reports that he does not use drugs.  No Known Allergies  History reviewed. No pertinent family history.  Prior to Admission medications   Medication Sig Start Date End Date Taking? Authorizing Provider  acamprosate (CAMPRAL) 333 MG tablet Take 666 mg by mouth 3 (three) times daily with meals.   Yes [provider]  amLODipine (NORVASC) 5 MG tablet Take 5 mg by mouth daily. 11/04/23  Yes [provider]  aspirin EC 81 MG tablet Take 81 mg by mouth daily.   Yes [provider]  cetirizine (ZYRTEC) 10 MG tablet Take 10 mg by mouth daily as needed for allergies.   Yes [provider]  cyanocobalamin  1000 MCG tablet Take by mouth.   Yes [provider]  fluticasone (FLONASE) 50 MCG/ACT nasal spray Place 2 sprays into both nostrils daily as needed for allergies.   Yes [provider]  lisinopril (PRINIVIL,ZESTRIL) 20 MG tablet Take 20 mg by mouth daily.  01/29/16  Yes [provider]  magnesium  oxide (MAG-OX) 400 MG tablet Take 400 mg by mouth daily.   Yes [provider]  metoprolol tartrate (LOPRESSOR) 50 MG tablet Take 50 mg by mouth 2 (two)  times daily.   Yes [provider]  nitroGLYCERIN (NITROSTAT) 0.4 MG SL tablet Place 0.4 mg under the tongue every 5 (five) minutes as needed for chest pain.   Yes [provider]  Omega-3 Fatty Acids (FISH OIL) 1200 MG CAPS Take 1,200 mg by mouth daily.    Yes [provider]  omeprazole (PRILOSEC) 20 MG capsule Take 20 mg by mouth daily.   Yes [provider]  simvastatin (ZOCOR) 20 MG tablet Take 20 mg by mouth at bedtime.    Yes [provider]  oxyCODONE -acetaminophen  (PERCOCET) 5-325 MG tablet Take 1 tablet by mouth every 4 (four) hours as needed. Patient not taking: Reported on 04/16/2024 02/13/21   Edelmiro Leash, MD    Physical Exam: Vitals:   04/15/24 2029 04/15/24 2031 04/16/24 0146 04/16/24 0207  BP:  115/89 (!) 78/52 (!) 82/53  Pulse:  90 65 67  Resp:  16 18 15   Temp:  97.7 F (36.5 C) (!) 97.2 F (36.2 C) (!) 97.2 F (36.2 C)  TempSrc:  Oral Oral Oral  SpO2:  96% 99% 100%  Weight: 46.3 kg     Height: 5' 9 (1.753 m)      Physical Exam Vitals and nursing note reviewed.  Constitutional:      General: He is awake. He is not in acute distress.    Appearance: He is cachectic.  HENT:     Head: Normocephalic and atraumatic.  Cardiovascular:     Rate and Rhythm: Normal rate and regular rhythm.     Heart sounds: Normal heart sounds.  Pulmonary:     Effort: Pulmonary effort is normal.     Breath sounds: Normal breath sounds.  Abdominal:     Palpations: Abdomen is soft.     Tenderness: There is no abdominal tenderness.  Neurological:     Mental Status: He is alert. Mental status is at baseline.     Labs on Admission: I have personally reviewed following labs and imaging studies  CBC: Recent Labs  Lab 04/15/24 2033  WBC 9.0  HGB 5.4*  HCT 17.3*  MCV 79.0*  PLT 465*   Basic Metabolic Panel: Recent Labs  Lab 04/15/24 2033  NA 130*  K 3.2*  CL 97*  CO2 16*  GLUCOSE 133*  BUN 79*  CREATININE 6.44*   CALCIUM 7.9*  MG 1.4*   GFR: Estimated Creatinine Clearance: 7.5 mL/min (A) (by C-G formula based on SCr of 6.44 mg/dL (H)). Liver Function Tests: Recent Labs  Lab 04/15/24 2033  AST 29  ALT 15  ALKPHOS 69  BILITOT 1.0  PROT 7.9  ALBUMIN 3.5   No results for input(s): LIPASE, AMYLASE in the last 168 hours. No results for input(s): AMMONIA in the last 168 hours. Coagulation Profile: Recent Labs  Lab 04/16/24 0005  INR 1.3*   Cardiac Enzymes: No results for input(s): CKTOTAL, CKMB, CKMBINDEX, TROPONINI in the last 168 hours. BNP (last 3 results) No  results for input(s): PROBNP in the last 8760 hours. HbA1C: No results for input(s): HGBA1C in the last 72 hours. CBG: No results for input(s): GLUCAP in the last 168 hours. Lipid Profile: No results for input(s): CHOL, HDL, LDLCALC, TRIG, CHOLHDL, LDLDIRECT in the last 72 hours. Thyroid Function Tests: No results for input(s): TSH, T4TOTAL, FREET4, T3FREE, THYROIDAB in the last 72 hours. Anemia Panel: No results for input(s): VITAMINB12, FOLATE, FERRITIN, TIBC, IRON, RETICCTPCT in the last 72 hours. Urine analysis: No results found for: COLORURINE, APPEARANCEUR, LABSPEC, PHURINE, GLUCOSEU, HGBUR, BILIRUBINUR, KETONESUR, PROTEINUR, UROBILINOGEN, NITRITE, LEUKOCYTESUR  Radiological Exams on Admission: CT CHEST ABDOMEN PELVIS WO CONTRAST Result Date: 04/16/2024 EXAM: CT CHEST, ABDOMEN AND PELVIS WITHOUT CONTRAST 04/16/2024 12:34:20 AM TECHNIQUE: CT of the chest, abdomen and pelvis was performed without the administration of intravenous contrast. Multiplanar reformatted images are provided for review. Automated exposure control, iterative reconstruction, and/or weight based adjustment of the mA/kV was utilized to reduce the radiation dose to as low as reasonably achievable. COMPARISON: 09/08/2022 CLINICAL HISTORY: Eval for malignancy. Pt reports leg  weakness for the past 2 weeks, pt reports he feels dehydrated but denies change in eating or drinking. Pt denies dysuria, cough congestion or fever. FINDINGS: CHEST: MEDIASTINUM AND LYMPH NODES: Heart and pericardium are unremarkable. The central airways are clear. No mediastinal, hilar or axillary lymphadenopathy. LUNGS AND PLEURA: No focal consolidation or pulmonary edema. No pleural effusion or pneumothorax. Emphysema. ABDOMEN AND PELVIS: LIVER: Hepatic steatosis. GALLBLADDER AND BILE DUCTS: Cholelithiasis. No evidence of acute cholecystitis. SPLEEN: No acute abnormality. PANCREAS: Pancreatic calcifications compatible with sequelae of chronic pancreatitis. ADRENAL GLANDS: No acute abnormality. KIDNEYS, URETERS AND BLADDER: No stones in the kidneys or ureters. No hydronephrosis. No perinephric or periureteral stranding. Urinary bladder is unremarkable. GI AND BOWEL: Stomach demonstrates no acute abnormality. There is no bowel obstruction. Diverticulosis without evidence of diverticulitis. Normal appendix. REPRODUCTIVE ORGANS: No acute abnormality. PERITONEUM AND RETROPERITONEUM: No ascites. No free air. VASCULATURE: Coronary artery and aortic atherosclerotic calcification. Aorta is normal in caliber. Low-density blood pool compatible with anemia. ABDOMINAL AND PELVIS LYMPH NODES: No lymphadenopathy. REPRODUCTIVE ORGANS: No acute abnormality. BONES AND SOFT TISSUES: No acute osseous abnormality. No focal soft tissue abnormality. Bilateral femoral head AVN. IMPRESSION: 1. No acute findings. 2. Emphysema. 3. Hepatic steatosis. 4. Pancreatic calcifications compatible with sequelae of chronic pancreatitis. 5. Cholelithiasis without evidence of acute cholecystitis. 6. Diverticulosis without evidence of diverticulitis. 7. Bilateral femoral head AVN. Electronically signed by: Norman Gatlin MD 04/16/2024 12:54 AM EDT RP Workstation: [id number]   Data Reviewed for HPI: Relevant notes from primary care and specialist  visits, past discharge summaries as available in EHR, including Care Everywhere. Prior diagnostic testing as pertinent to current admission diagnoses Updated medications and problem lists for reconciliation ED course, including vitals, labs, imaging, treatment and response to treatment Triage notes, nursing and pharmacy notes and ED provider's notes Notable results as noted above in HPI      Assessment and Plan: * Melena NSAID induced gastropathy/alcoholic gastritis Acute on likely chronic blood loss anemia  Hypotension secondary to hypovolemia/acute blood loss hemoglobin of 5.4, with most recent of 7.8 back in April 2025. Continue transfusion of 3 units PRBCs IV Protonix  and octreotide  Close hemodynamic monitoring Serial H&H GI consult N.p.o. for likely endoscopy in the a.m. Patient is at high risk for clinical deterioration  AKI (acute kidney injury) (HCC) Anion gap metabolic acidosis Suspect NSAID induced nephropathy Suspect ATN/(?interstitial nephritis) related to NSAIDs in combination with hypovolemia/hypotension from blood loss  CT chest abdomen and pelvis with no concerning findings and kidneys/urinary system Received a 2 L fluid bolus in the ED Receiving blood transfusions Monitor renal function and avoid nephrotoxins Nephrology consulted  Electrolyte abnormality Hypokalemia, hypomagnesemia and hyponatremia Likely related to hypovolemia, hypotension, alcohol use Pharmacy consulted for assistance with electrolyte management  CAD S/P percutaneous coronary angioplasty Elevated troponin Troponin elevated in the 200s but downtrending, likely supply/demand mismatch Will hold aspirin due to bleeding Hold metoprolol, lisinopril, nitro due to hypotension Will get echo to evaluate for wall motion abnormality in view of history  Cachexia (HCC) Suspect severe protein calorie malnutrition related to alcohol use disorder BMI 15 Keeping n.p.o. Nutritionist  evaluation  Tobacco use disorder Nicotine  patch  Alcohol use disorder Given thiamine  folate and multivitamin in the ED CIWA withdrawal protocol        DVT prophylaxis: SCD  Consults: GI, nephrology  Advance Care Planning:   Code Status: Full Code   Family Communication: nephew at bedside  Disposition Plan: Back to previous home environment  Severity of Illness: The appropriate patient status for this patient is INPATIENT. Inpatient status is judged to be reasonable and necessary in order to provide the required intensity of service to ensure the patient's safety. The patient's presenting symptoms, physical exam findings, and initial radiographic and laboratory data in the context of their chronic comorbidities is felt to place them at high risk for further clinical deterioration. Furthermore, it is not anticipated that the patient will be medically stable for discharge from the hospital within 2 midnights of admission.   * I certify that at the point of admission it is my clinical judgment that the patient will require inpatient hospital care spanning beyond 2 midnights from the point of admission due to high intensity of service, high risk for further deterioration and high frequency of surveillance required.* CRITICAL CARE Performed by: Delayne LULLA Solian   Total critical care time: 65 minutes  Critical care time was exclusive of separately billable procedures and treating other patients.  Critical care was necessary to treat or prevent imminent or life-threatening deterioration.  Critical care was time spent personally by me on the following activities: development of treatment plan with patient and/or surrogate as well as nursing, discussions with consultants, evaluation of patient's response to treatment, examination of patient, obtaining history from patient or surrogate, ordering and performing treatments and interventions, ordering and review of laboratory studies, ordering  and review of radiographic studies, pulse oximetry and re-evaluation of patient's condition.  Author: Delayne LULLA Solian, MD 04/16/2024 2:31 AM  For on call review [email or web address].

## 2024-04-16 NOTE — Assessment & Plan Note (Addendum)
 Anion gap metabolic acidosis Suspect NSAID induced nephropathy Suspect ATN/(?interstitial nephritis) related to NSAIDs in combination with hypovolemia/hypotension from blood loss CT chest abdomen and pelvis with no concerning findings and kidneys/urinary system Received a 2 L fluid bolus in the ED Receiving blood transfusions Monitor renal function and avoid nephrotoxins Nephrology consulted

## 2024-04-16 NOTE — Progress Notes (Signed)
 Progress Note   Patient: Bernard Harris FMW:[id number] DOB: 1960/10/06 DOA: 04/15/2024     0 DOS: the patient was seen and examined on 04/16/2024   Brief hospital course: From HPI Bernard Harris is a 63 y.o. male with medical history significant for COPD, tobacco use disorder, COPD, HTN, CAD with history of stent 2011, being admitted with melena and acute blood loss anemia(Hb 5.4) as well as severe AKI(creatinine 6.44) believe NSAID related.  Patient presented with 2 weeks of fatigue and worsening dyspnea on exertion.  He has noted black stool for the past week.  Denies abdominal pain,. emesis.  Denies chest pain. Reports he has been having knee pain from a fall 3 weeks ago and has been using ibuprofen up to 7 tablets twice a day based on what a friend told him his doctor prescribed for similar problem. Endorses drinking anywhere from 1-5 drinks of vodka daily but has not done so recently due to the weakness(states half gallon of vodka lasts 2 to 3 weeks) In the ED, vitals initially within normal limits however last night progressed BP started trending down to as low as 78/52, improving somewhat with fluids. Labs notable for hemoglobin of 5.4, with most recent of 7.8 back in April 2025. Creatinine noted to be 6.44 most recently 0.7 in April  Bicarb of 16, anion gap of 17, normal LFTs Sodium 130 and potassium 3.2, magnesium  1.4 EtOH less than 15 Troponin 277--255 UDS pending EKG showing NSR at 66 with nonspecific ST-T wave changes CT chest abdomen pelvis without with no acute findings.  Does show hepatic steatosis, cholelithiasis without cholecystitis, pancreatic calcifications compatible with chronic pancreatitis.  Patient typed and crossed and started on 1 of 3 units PRBCs, given an NS bolus, IV mag repletion, IV Protonix  80 mg. Admission requested      Assessment and Plan:  Melena NSAID induced gastropathy/alcoholic gastritis Acute on likely chronic blood loss anemia   Hypotension secondary to hypovolemia/acute blood loss hemoglobin of 5.4, with most recent of 7.8 back in April 2025. Continue transfusion of 3 units PRBCs Continue IV Protonix  and octreotide  Patient has been seen by GI with plans to have EGD when clinically stable   AKI (acute kidney injury) (HCC) Anion gap metabolic acidosis Suspect NSAID induced nephropathy Suspect ATN/(?interstitial nephritis) related to NSAIDs in combination with hypovolemia/hypotension from blood loss CT chest abdomen and pelvis with no concerning findings and kidneys/urinary system Received a 2 L fluid bolus in the ED Receiving blood transfusions Monitor renal function and avoid nephrotoxins Plan of care discussed with nephrologist   Electrolyte abnormality Hypokalemia, hypomagnesemia and hyponatremia Likely related to hypovolemia, hypotension, alcohol use Pharmacy consulted for assistance with electrolyte management   CAD S/P percutaneous coronary angioplasty Elevated troponin Troponin elevated in the 200s but downtrending, likely supply/demand mismatch Will hold aspirin due to bleeding Hold metoprolol, lisinopril, nitro due to hypotension Will get echo to evaluate for wall motion abnormality in view of history   Cachexia (HCC) Suspect severe protein calorie malnutrition related to alcohol use disorder BMI 15    Tobacco use disorder Nicotine  patch   Alcohol use disorder Given thiamine  folate and multivitamin in the ED CIWA withdrawal protocol  Bilateral foot gangrene Podiatry as well as vascular surgeon consulted  DVT prophylaxis: SCD   Consults: GI, nephrology   Advance Care Planning:   Code Status: Full Code    Family Communication: nephew at bedside   Disposition Plan: Back to previous home environment   Subjective:  Patient  seen and examined at bedside this morning Denies worsening chest pain abdomen pain cough  Physical Exam: Vitals and nursing note reviewed.  Constitutional:       General: He is awake. He is not in acute distress.    Appearance: He is cachectic.  HENT:     Head: Normocephalic and atraumatic.  Cardiovascular:     Rate and Rhythm: Normal rate and regular rhythm.     Heart sounds: Normal heart sounds.  Pulmonary:     Effort: Pulmonary effort is normal.     Breath sounds: Normal breath sounds.  Abdominal:     Palpations: Abdomen is soft.     Tenderness: There is no abdominal tenderness.  Neurological:     Mental Status: He is alert. Mental status is at baseline.      Vitals:   04/16/24 1147 04/16/24 1204 04/16/24 1413 04/16/24 1430  BP: 113/77 108/73 123/73 108/73  Pulse: 68 68 67 69  Resp: 15  13 16   Temp: 97.8 F (36.6 C)  97.8 F (36.6 C)   TempSrc: Oral  Oral   SpO2: 99%  99% 100%  Weight:      Height:        Data Reviewed:     Latest Ref Rng & Units 04/16/2024   12:58 PM 04/15/2024    8:33 PM 02/12/2021    5:40 PM  CBC  WBC 4.0 - 10.5 K/uL 9.3  9.0  9.9   Hemoglobin 13.0 - 17.0 g/dL 88.3  5.4  86.3   Hematocrit 39.0 - 52.0 % 35.4  17.3  39.3   Platelets 150 - 400 K/uL 289  465  319        Latest Ref Rng & Units 04/16/2024   12:58 PM 04/15/2024    8:33 PM 02/12/2021    5:40 PM  BMP  Glucose 70 - 99 mg/dL 889  866  878   BUN 8 - 23 mg/dL 73  79  25   Creatinine 0.61 - 1.24 mg/dL 4.36  3.55  8.89   Sodium 135 - 145 mmol/L 134  130  127   Potassium 3.5 - 5.1 mmol/L 4.6  3.2  4.7   Chloride 98 - 111 mmol/L 104  97  93   CO2 22 - 32 mmol/L 14  16  21    Calcium 8.9 - 10.3 mg/dL 7.3  7.9  9.4     CT scan of the abdomen reviewed that showed findings of pancreatic calcification concerning for chronic pancreatitis, cholelithiasis without evidence of acute cholecystitis  Family Communication: Sister present at bedside  Disposition: Status is: Inpatient   Time spent: 54 minutes  Author: Drue ONEIDA Potter, MD 04/16/2024 3:55 PM  For on call review [email or web address].

## 2024-04-16 NOTE — Assessment & Plan Note (Deleted)
 Troponin elevated but downtrending Likely demand ischemia

## 2024-04-16 NOTE — H&P (Signed)
 Ruel Kung , MD [address], [address], [place], [place], [address] Phone: [phone] Fax: [phone]  Consultation  Referring Provider:   Dr. Cleatus Primary Care Physician:  Glover Lenis, MD Primary Gastroenterologist:  Dr. Aundria         Reason for Consultation:     GI bleed  Date of Admission:  04/15/2024 Date of Consultation:  04/16/2024         HPI:   Bernard Harris is a 63 y.o. male has a prior history of alcohol abuse nicotine  use COPD hypertension CAD with a cardiac stent in 2011.   He presented to the emergency room yesterday with fatigue and shortness of breath on exertion he was also noted to have black stools for the past week.  He had a fall 3 weeks back and had some knee pain and been taking up to 7 tablets of ibuprofen twice a day since then in addition has a history of drinking vodka on a daily basis but not done so recently.  He says that he has stopped taking ibuprofen.  He denies any hematemesis.  No bowel movements after coming to the hospital.  Denies any abdominal pain.  Feels much better right now.  When he came to the emergency room he was hypotensive with a blood pressure of systolic less than 80 mm found to have a hemoglobin of 5.4 g.  MCV of 79 platelet count of 465 3 years back hemoglobin was 13.6..  Magnesium  of 1.4 creatinine of 6.44.  CO2 of 16 chloride of 97 LFTs normal.  Troponin was elevated at 277.  Hemoglobin in April 2025 was 7.8 g iron was low at 24 at that point of time.   The emergency room had a CT scan of chest abdomen pelvis without contrast with pancreatic calcifications sequelae of chronic pancreatitis hepatic steatosis diverticulosis without diverticulitis.  I been consulted for the possible GI bleed related blood loss anemia.  Past Medical History:  Diagnosis Date   COPD (chronic obstructive pulmonary disease) (HCC)    Coronary artery disease    Current every day smoker    Dyspnea    with exertion   GERD  (gastroesophageal reflux disease)    Heart attack (HCC)    X 2   Hyperlipidemia    Hypertension     Past Surgical History:  Procedure Laterality Date   CARDIAC CATHETERIZATION     COLONOSCOPY N/A 01/09/2022   Procedure: COLONOSCOPY;  Surgeon: Toledo, Ladell POUR, MD;  Location: [facility] ENDOSCOPY;  Service: Gastroenterology;  Laterality: N/A;   CORONARY STENT PLACEMENT     FOOT SURGERY Bilateral 1980's    Prior to Admission medications   Medication Sig Start Date End Date Taking? Authorizing Provider  acamprosate (CAMPRAL) 333 MG tablet Take 666 mg by mouth 3 (three) times daily with meals.   Yes [provider]  amLODipine (NORVASC) 5 MG tablet Take 5 mg by mouth daily. 11/04/23  Yes [provider]  aspirin EC 81 MG tablet Take 81 mg by mouth daily.   Yes [provider]  cetirizine (ZYRTEC) 10 MG tablet Take 10 mg by mouth daily as needed for allergies.   Yes [provider]  cyanocobalamin  1000 MCG tablet Take by mouth.   Yes [provider]  fluticasone (FLONASE) 50 MCG/ACT nasal spray Place 2 sprays into both nostrils daily as needed for allergies.   Yes [provider]  lisinopril (PRINIVIL,ZESTRIL) 20 MG tablet Take 20 mg by mouth  daily.  01/29/16  Yes [provider]  magnesium  oxide (MAG-OX) 400 MG tablet Take 400 mg by mouth daily.   Yes [provider]  metoprolol tartrate (LOPRESSOR) 50 MG tablet Take 50 mg by mouth 2 (two) times daily.   Yes [provider]  nitroGLYCERIN (NITROSTAT) 0.4 MG SL tablet Place 0.4 mg under the tongue every 5 (five) minutes as needed for chest pain.   Yes [provider]  Omega-3 Fatty Acids (FISH OIL) 1200 MG CAPS Take 1,200 mg by mouth daily.    Yes [provider]  omeprazole (PRILOSEC) 20 MG capsule Take 20 mg by mouth daily.   Yes [provider]  simvastatin (ZOCOR) 20 MG tablet Take 20 mg by mouth at bedtime.    Yes [provider]   oxyCODONE -acetaminophen  (PERCOCET) 5-325 MG tablet Take 1 tablet by mouth every 4 (four) hours as needed. Patient not taking: Reported on 04/16/2024 02/13/21   Edelmiro Leash, MD    History reviewed. No pertinent family history.   Social History   Tobacco Use   Smoking status: Every Day    Current packs/day: 0.25    Average packs/day: 0.3 packs/day for 48.0 years (12.0 ttl pk-yrs)    Types: Cigarettes   Smokeless tobacco: Never  Vaping Use   Vaping status: Never Used  Substance Use Topics   Alcohol use: Yes    Alcohol/week: 3.0 - 5.0 standard drinks of alcohol    Types: 2 - 3 Cans of beer, 1 - 2 Shots of liquor per week    Comment: daily   Drug use: No    Allergies as of 04/15/2024   (No Known Allergies)    Review of Systems:    All systems reviewed and negative except where noted in HPI.   Physical Exam:  Vital signs in last 24 hours: Temp:  [97.2 F (36.2 C)-97.8 F (36.6 C)] 97.8 F (36.6 C) (09/12 1147) Pulse Rate:  [52-102] 68 (09/12 1204) Resp:  [12-18] 15 (09/12 1147) BP: (69-118)/(48-89) 108/73 (09/12 1204) SpO2:  [96 %-100 %] 99 % (09/12 1147) Weight:  [46.3 kg] 46.3 kg (09/11 2029)   General:   Pleasant, cooperative in NAD Head:  Normocephalic and atraumatic. Eyes:   No icterus.   Conjunctiva pink. PERRLA. Ears:  Normal auditory acuity. Neck:  Supple; no masses or thyroidomegaly Lungs: Respirations even and unlabored. Lungs clear to auscultation bilaterally.   No wheezes, crackles, or rhonchi.  Heart:  Regular rate and rhythm;  Without murmur, clicks, rubs or gallops Abdomen:  Soft, nondistended, nontender. Normal bowel sounds. No appreciable masses or hepatomegaly.  No rebound or guarding.  Neurologic:  Alert and oriented x3;  grossly normal neurologically. Skin:  Intact without significant lesions or rashes. Cervical Nodes:  No significant cervical adenopathy. Psych:  Alert and cooperative. Normal affect.  LAB RESULTS: Recent Labs     04/15/24 2033 04/16/24 1258  WBC 9.0 9.3  HGB 5.4* 11.6*  HCT 17.3* 35.4*  PLT 465* 289   BMET Recent Labs    04/15/24 2033 04/16/24 1258  NA 130* 134*  K 3.2* 4.6  CL 97* 104  CO2 16* 14*  GLUCOSE 133* 110*  BUN 79* 73*  CREATININE 6.44* 5.63*  CALCIUM 7.9* 7.3*   LFT Recent Labs    04/15/24 2033  PROT 7.9  ALBUMIN 3.5  AST 29  ALT 15  ALKPHOS 69  BILITOT 1.0   PT/INR Recent Labs    04/16/24 0005  LABPROT 17.0*  INR 1.3*    STUDIES: ECHOCARDIOGRAM COMPLETE Result Date: 04/16/2024    ECHOCARDIOGRAM REPORT   Patient Name:   TREW SUNDE Date of Exam: 04/16/2024 Medical Rec #:  [id number]             Height:       69.0 in Accession #:    [id number]            Weight:       102.0 lb Date of Birth:  12-07-1960              BSA:          1.552 m Patient Age:    63 years              BP:           112/67 mmHg Patient Gender: M                     HR:           64 bpm. Exam Location:  [facility] Procedure: 2D Echo, Cardiac Doppler and Color Doppler (Both Spectral and Color            Flow Doppler were utilized during procedure). Indications:     Elevated troponin  History:         Patient has no prior history of Echocardiogram examinations.                  Previous Myocardial Infarction, COPD, Signs/Symptoms:Dyspnea;                  Risk Factors:Hypertension.  Sonographer:     Christopher Furnace Referring Phys:  [id number] DELAYNE LULLA SOLIAN Diagnosing Phys: Marsa Dooms MD  Sonographer Comments: Image acquisition challenging due to patient body habitus. IMPRESSIONS  1. Left ventricular ejection fraction, by estimation, is 40 to 45%. The left ventricle has mildly decreased function. The left ventricle has no regional wall motion abnormalities. Left ventricular diastolic parameters were normal.  2. Right ventricular systolic function is normal. The right ventricular size is normal.  3. The mitral valve is normal in structure. Mild to moderate mitral valve regurgitation. No evidence of  mitral stenosis.  4. The aortic valve is normal in structure. Aortic valve regurgitation is mild. No aortic stenosis is present.  5. The inferior vena cava is normal in size with greater than 50% respiratory variability, suggesting right atrial pressure of 3 mmHg. FINDINGS  Left Ventricle: Left ventricular ejection fraction, by estimation, is 40 to 45%. The left ventricle has mildly decreased function. The left ventricle has no regional wall motion abnormalities. Strain was performed and the global longitudinal strain is indeterminate. The left ventricular internal cavity size was normal in size. There is no left ventricular hypertrophy. Left ventricular diastolic parameters were normal. Right Ventricle: The right ventricular size is normal. No increase in right ventricular wall thickness. Right ventricular systolic function is normal. Left Atrium: Left atrial size was normal in size. Right Atrium: Right atrial size was normal in size. Pericardium: There is no evidence of pericardial effusion. Mitral Valve: The mitral valve is normal in structure. Mild to moderate mitral valve regurgitation. No evidence of mitral valve stenosis. Tricuspid Valve: The tricuspid valve is normal in structure. Tricuspid valve regurgitation is mild . No evidence of tricuspid stenosis. Aortic Valve: The aortic valve is normal in structure. Aortic valve regurgitation is mild. No aortic stenosis is present. Aortic valve mean gradient measures 2.5 mmHg. Aortic valve  peak gradient measures 4.6 mmHg. Aortic valve area, by VTI measures 1.57 cm. Pulmonic Valve: The pulmonic valve was normal in structure. Pulmonic valve regurgitation is not visualized. No evidence of pulmonic stenosis. Aorta: The aortic root is normal in size and structure. Venous: The inferior vena cava is normal in size with greater than 50% respiratory variability, suggesting right atrial pressure of 3 mmHg. IAS/Shunts: No atrial level shunt detected by color flow Doppler.  Additional Comments: 3D was performed not requiring image post processing on an independent workstation and was abnormal.  LEFT VENTRICLE PLAX 2D LVIDd:         5.10 cm      Diastology LVIDs:         4.20 cm      LV e' medial:    9.57 cm/s LV PW:         1.00 cm      LV E/e' medial:  8.8 LV IVS:        1.00 cm      LV e' lateral:   14.00 cm/s LVOT diam:     2.20 cm      LV E/e' lateral: 6.0 LV SV:         32 LV SV Index:   21 LVOT Area:     3.80 cm  LV Volumes (MOD) LV vol d, MOD A4C: 122.0 ml LV vol s, MOD A4C: 73.9 ml LV SV MOD A4C:     122.0 ml RIGHT VENTRICLE RV Basal diam:  3.70 cm RV Mid diam:    4.30 cm RV S prime:     9.36 cm/s TAPSE (M-mode): 2.2 cm LEFT ATRIUM             Index        RIGHT ATRIUM           Index LA diam:        3.30 cm 2.13 cm/m   RA Area:     13.40 cm LA Vol (A2C):   80.5 ml 51.87 ml/m  RA Volume:   33.70 ml  21.72 ml/m LA Vol (A4C):   43.3 ml 27.90 ml/m LA Biplane Vol: 58.8 ml 37.89 ml/m  AORTIC VALVE AV Area (Vmax):    1.47 cm AV Area (Vmean):   1.42 cm AV Area (VTI):     1.57 cm AV Vmax:           107.50 cm/s AV Vmean:          73.050 cm/s AV VTI:            0.206 m AV Peak Grad:      4.6 mmHg AV Mean Grad:      2.5 mmHg LVOT Vmax:         41.50 cm/s LVOT Vmean:        27.300 cm/s LVOT VTI:          0.085 m LVOT/AV VTI ratio: 0.41  AORTA Ao Root diam: 2.90 cm MITRAL VALVE               TRICUSPID VALVE MV Area (PHT): 3.53 cm    TR Peak grad:   44.6 mmHg MV Decel Time: 215 msec    TR Vmax:        334.00 cm/s MV E velocity: 84.40 cm/s MV A velocity: 56.10 cm/s  SHUNTS MV E/A ratio:  1.50        Systemic VTI:  0.08 m  Systemic Diam: 2.20 cm Marsa Dooms MD Electronically signed by Marsa Dooms MD Signature Date/Time: 04/16/2024/12:57:44 PM    Final    US  RENAL Result Date: 04/16/2024 CLINICAL DATA:  [id number] Acute kidney failure [facility]) [id number] EXAM: RENAL / URINARY TRACT ULTRASOUND COMPLETE COMPARISON:  04/16/2024 CT of the chest, abdomen,  and pelvis FINDINGS: Right Kidney: Renal measurements: 8.9 x 4.4 x 5.4 cm = volume: 110 mL.Normal echogenicity. No mass. No hydronephrosis or nephrolithiasis. Left Kidney: Renal measurements: 9.1 x 4.9 x 3.7 cm = volume: 85 mL. Normal echogenicity. No mass. No hydronephrosis or nephrolithiasis. Bladder: Appears normal for degree of bladder distention. Other: None. IMPRESSION: No hydronephrosis or nephrolithiasis. Electronically Signed   By: Rogelia Myers M.D.   On: 04/16/2024 12:26   CT CHEST ABDOMEN PELVIS WO CONTRAST Result Date: 04/16/2024 EXAM: CT CHEST, ABDOMEN AND PELVIS WITHOUT CONTRAST 04/16/2024 12:34:20 AM TECHNIQUE: CT of the chest, abdomen and pelvis was performed without the administration of intravenous contrast. Multiplanar reformatted images are provided for review. Automated exposure control, iterative reconstruction, and/or weight based adjustment of the mA/kV was utilized to reduce the radiation dose to as low as reasonably achievable. COMPARISON: 09/08/2022 CLINICAL HISTORY: Eval for malignancy. Pt reports leg weakness for the past 2 weeks, pt reports he feels dehydrated but denies change in eating or drinking. Pt denies dysuria, cough congestion or fever. FINDINGS: CHEST: MEDIASTINUM AND LYMPH NODES: Heart and pericardium are unremarkable. The central airways are clear. No mediastinal, hilar or axillary lymphadenopathy. LUNGS AND PLEURA: No focal consolidation or pulmonary edema. No pleural effusion or pneumothorax. Emphysema. ABDOMEN AND PELVIS: LIVER: Hepatic steatosis. GALLBLADDER AND BILE DUCTS: Cholelithiasis. No evidence of acute cholecystitis. SPLEEN: No acute abnormality. PANCREAS: Pancreatic calcifications compatible with sequelae of chronic pancreatitis. ADRENAL GLANDS: No acute abnormality. KIDNEYS, URETERS AND BLADDER: No stones in the kidneys or ureters. No hydronephrosis. No perinephric or periureteral stranding. Urinary bladder is unremarkable. GI AND BOWEL: Stomach  demonstrates no acute abnormality. There is no bowel obstruction. Diverticulosis without evidence of diverticulitis. Normal appendix. REPRODUCTIVE ORGANS: No acute abnormality. PERITONEUM AND RETROPERITONEUM: No ascites. No free air. VASCULATURE: Coronary artery and aortic atherosclerotic calcification. Aorta is normal in caliber. Low-density blood pool compatible with anemia. ABDOMINAL AND PELVIS LYMPH NODES: No lymphadenopathy. REPRODUCTIVE ORGANS: No acute abnormality. BONES AND SOFT TISSUES: No acute osseous abnormality. No focal soft tissue abnormality. Bilateral femoral head AVN. IMPRESSION: 1. No acute findings. 2. Emphysema. 3. Hepatic steatosis. 4. Pancreatic calcifications compatible with sequelae of chronic pancreatitis. 5. Cholelithiasis without evidence of acute cholecystitis. 6. Diverticulosis without evidence of diverticulitis. 7. Bilateral femoral head AVN. Electronically signed by: Norman Gatlin MD 04/16/2024 12:54 AM EDT RP Workstation: [id number]      Impression / Plan:   [place] Fant is a 63 y.o. y/o male with a history of alcohol abuse, COPD, hypertension, CAD who had a fall about 3 weeks back and since then has been taking ibuprofen twice a day presented with shortness of breath, fatigue and a history of tarry black stools.  Hemoglobin found to be 5.4 with a low MCV baseline hemoglobin was over 7 g a few months back with a normal MCV.  Very likely had a NSAID related ulcer with blood loss causing the anemia.  In addition AKI may be a combination of anemia as well as effects of NSAIDs.  Denies being on any blood thinners  Plan 1.  Monitor CBC and transfuse as needed 2.  Stop all NSAIDs 3.  IV PPI 4.  EGD  once hemodynamically more stable rehydrated presently has AKI hemoglobin over 7 g.  Will tentatively schedule for tomorrow.  Also require cardiac clearance to ensure safe to perform anesthesia.  Troponins have been elevated. 5.  Continue thiamine  and watch for alcohol  withdrawal.  If there is any evidence of an acute GI bleed consider CT angiogram in the interim.   I have discussed alternative options, risks & benefits,  which include, but are not limited to, bleeding, infection, perforation,respiratory complication & drug reaction.  The patient agrees with this plan & written consent will be obtained.     Thank you for involving me in the care of this patient.      LOS: 0 days   Ruel Kung, MD  04/16/2024, 1:46 PM

## 2024-04-16 NOTE — ED Notes (Signed)
 Dr lateef at bedside

## 2024-04-17 ENCOUNTER — Inpatient Hospital Stay: Admitting: Certified Registered"

## 2024-04-17 ENCOUNTER — Encounter: Admission: EM | Disposition: A | Discharge status: Skilled Nursing Facility | Payer: Self-pay | Source: Home / Self Care

## 2024-04-17 ENCOUNTER — Inpatient Hospital Stay

## 2024-04-17 ENCOUNTER — Encounter: Payer: Self-pay | Admitting: Internal Medicine

## 2024-04-17 DIAGNOSIS — R238 Other skin changes: Secondary | ICD-10-CM

## 2024-04-17 DIAGNOSIS — R460 Very low level of personal hygiene: Secondary | ICD-10-CM | POA: Diagnosis not present

## 2024-04-17 DIAGNOSIS — F17210 Nicotine dependence, cigarettes, uncomplicated: Secondary | ICD-10-CM

## 2024-04-17 DIAGNOSIS — K921 Melena: Secondary | ICD-10-CM | POA: Diagnosis not present

## 2024-04-17 DIAGNOSIS — N179 Acute kidney failure, unspecified: Secondary | ICD-10-CM

## 2024-04-17 DIAGNOSIS — D62 Acute posthemorrhagic anemia: Secondary | ICD-10-CM

## 2024-04-17 DIAGNOSIS — I96 Gangrene, not elsewhere classified: Principal | ICD-10-CM

## 2024-04-17 HISTORY — PX: ESOPHAGOGASTRODUODENOSCOPY: SHX5428

## 2024-04-17 LAB — TYPE AND SCREEN
ABO/RH(D): A POS
Antibody Screen: NEGATIVE
Unit division: 0
Unit division: 0
Unit division: 0

## 2024-04-17 LAB — BPAM RBC
Blood Product Expiration Date: 202510122359
Blood Product Expiration Date: 202510122359
Blood Product Expiration Date: 202510122359
ISSUE DATE / TIME: 202509120136
ISSUE DATE / TIME: 202509120444
ISSUE DATE / TIME: 202509120729
Unit Type and Rh: 6200
Unit Type and Rh: 6200
Unit Type and Rh: 6200

## 2024-04-17 LAB — RENAL FUNCTION PANEL
Albumin: 2.9 g/dL — ABNORMAL LOW (ref 3.5–5.0)
Anion gap: 13 (ref 5–15)
BUN: 54 mg/dL — ABNORMAL HIGH (ref 8–23)
CO2: 17 mmol/L — ABNORMAL LOW (ref 22–32)
Calcium: 7.5 mg/dL — ABNORMAL LOW (ref 8.9–10.3)
Chloride: 107 mmol/L (ref 98–111)
Creatinine, Ser: 3.24 mg/dL — ABNORMAL HIGH (ref 0.61–1.24)
GFR, Estimated: 20 mL/min — ABNORMAL LOW (ref >60)
Glucose, Bld: 185 mg/dL — ABNORMAL HIGH (ref 70–99)
Phosphorus: 1.2 mg/dL — ABNORMAL LOW (ref 2.5–4.6)
Potassium: 3.7 mmol/L (ref 3.5–5.1)
Sodium: 137 mmol/L (ref 135–145)

## 2024-04-17 LAB — HIV ANTIBODY (ROUTINE TESTING W REFLEX): HIV Screen 4th Generation wRfx: NONREACTIVE

## 2024-04-17 LAB — HEMOGLOBIN
Hemoglobin: 11.7 g/dL — ABNORMAL LOW (ref 13.0–17.0)
Hemoglobin: 11.7 g/dL — ABNORMAL LOW (ref 13.0–17.0)
Hemoglobin: 11.9 g/dL — ABNORMAL LOW (ref 13.0–17.0)
Hemoglobin: 12.6 g/dL — ABNORMAL LOW (ref 13.0–17.0)
Hemoglobin: 12.7 g/dL — ABNORMAL LOW (ref 13.0–17.0)
Hemoglobin: 12 g/dL — ABNORMAL LOW (ref 13.0–17.0)

## 2024-04-17 LAB — GLUCOSE, CAPILLARY: Glucose-Capillary: 184 mg/dL — ABNORMAL HIGH (ref 70–99)

## 2024-04-17 SURGERY — EGD (ESOPHAGOGASTRODUODENOSCOPY)
Anesthesia: General

## 2024-04-17 MED ORDER — SODIUM CHLORIDE 0.9 % IV SOLN: INTRAVENOUS | Status: DC

## 2024-04-17 MED ORDER — PROPOFOL 500 MG/50ML IV EMUL
INTRAVENOUS | Status: DC | PRN
  Administered 2024-04-17: 120 ug/kg/min via INTRAVENOUS

## 2024-04-17 MED ORDER — PROPOFOL 10 MG/ML IV BOLUS
INTRAVENOUS | Status: DC | PRN
  Administered 2024-04-17: 60 mg via INTRAVENOUS

## 2024-04-17 MED ORDER — LIDOCAINE 2% (20 MG/ML) 5 ML SYRINGE
INTRAMUSCULAR | Status: DC | PRN
  Administered 2024-04-17: 20 mg via INTRAVENOUS

## 2024-04-17 MED ORDER — GLYCOPYRROLATE 0.2 MG/ML IJ SOLN
INTRAMUSCULAR | Status: AC
  Filled 2024-04-17: qty 1

## 2024-04-17 MED ORDER — PROPOFOL 10 MG/ML IV BOLUS
INTRAVENOUS | Status: AC
Start: 2024-04-17 — End: 2024-04-17
  Filled 2024-04-17: qty 20

## 2024-04-17 MED ORDER — LIDOCAINE HCL (PF) 2 % IJ SOLN
INTRAMUSCULAR | Status: AC
  Filled 2024-04-17: qty 5

## 2024-04-17 MED ORDER — GLYCOPYRROLATE 0.2 MG/ML IJ SOLN
INTRAMUSCULAR | Status: DC | PRN
  Administered 2024-04-17: .2 mg via INTRAVENOUS

## 2024-04-17 NOTE — Progress Notes (Signed)
 [facility]  ROUNDING NOTE   Subjective:   Patient seen sitting up in bed Alert Sister at bedside Seen after EGD  Creatinine 3.24  Objective:  Vital signs in last 24 hours:  Temp:  [97 F (36.1 C)-98.4 F (36.9 C)] 97.5 F (36.4 C) (09/13 1200) Pulse Rate:  [67-82] 82 (09/13 1200) Resp:  [13-23] 18 (09/13 1200) BP: (102-138)/(69-88) 138/88 (09/13 1200) SpO2:  [72 %-100 %] 72 % (09/13 1200) Weight:  [45.4 kg] 45.4 kg (09/13 0500)  Weight change: -0.867 kg Filed Weights   04/15/24 2029 04/17/24 0500  Weight: 46.3 kg 45.4 kg    Intake/Output: I/O last 3 completed shifts: In: 3599.8 [P.O.:180; I.V.:2866.3; Blood:553.5] Out: 2540 [Urine:2540]   Intake/Output this shift:  No intake/output data recorded.  Physical Exam: General: NAD  Head: Normocephalic, atraumatic. Moist oral mucosal membranes  Eyes: Anicteric  Lungs:  Clear to auscultation  Heart: Regular rate and rhythm  Abdomen:  Soft, nontender  Extremities:  No peripheral edema.  Neurologic: Awake, alert, conversant  Skin: Warm,dry, no rash       Basic Metabolic Panel: Recent Labs  Lab 04/15/24 2033 04/16/24 1258 04/17/24 0728  NA 130* 134* 137  K 3.2* 4.6 3.7  CL 97* 104 107  CO2 16* 14* 17*  GLUCOSE 133* 110* 185*  BUN 79* 73* 54*  CREATININE 6.44* 5.63* 3.24*  CALCIUM 7.9* 7.3* 7.5*  MG 1.4*  --   --   PHOS  --   --  1.2*    Liver Function Tests: Recent Labs  Lab 04/15/24 2033 04/17/24 0728  AST 29  --   ALT 15  --   ALKPHOS 69  --   BILITOT 1.0  --   PROT 7.9  --   ALBUMIN 3.5 2.9*   No results for input(s): LIPASE, AMYLASE in the last 168 hours. No results for input(s): AMMONIA in the last 168 hours.  CBC: Recent Labs  Lab 04/15/24 2033 04/16/24 1258 04/16/24 1626 04/16/24 2104 04/17/24 0059 04/17/24 0553 04/17/24 0728 04/17/24 1155  WBC 9.0 9.3  --   --   --   --   --   --   NEUTROABS  --  7.0  --   --   --   --   --   --   HGB 5.4* 11.6*   < >  11.2* 11.7* 11.7* 11.9* 12.6*  HCT 17.3* 35.4*  --   --   --   --   --   --   MCV 79.0* 85.1  --   --   --   --   --   --   PLT 465* 289  --   --   --   --   --   --    < > = values in this interval not displayed.    Cardiac Enzymes: No results for input(s): CKTOTAL, CKMB, CKMBINDEX, TROPONINI in the last 168 hours.  BNP: Invalid input(s): POCBNP  CBG: Recent Labs  Lab 04/16/24 1209 04/17/24 0425  GLUCAP 81 184*    Microbiology: No results found for this or any previous visit.  Coagulation Studies: Recent Labs    04/16/24 0005  LABPROT 17.0*  INR 1.3*    Urinalysis: Recent Labs    04/16/24 1025  COLORURINE YELLOW*  LABSPEC 1.009  PHURINE 6.0  GLUCOSEU NEGATIVE  HGBUR SMALL*  BILIRUBINUR NEGATIVE  KETONESUR NEGATIVE  PROTEINUR NEGATIVE  NITRITE NEGATIVE  LEUKOCYTESUR NEGATIVE  Imaging: ECHOCARDIOGRAM COMPLETE Result Date: 04/16/2024    ECHOCARDIOGRAM REPORT   Patient Name:   Bernard Harris Date of Exam: 04/16/2024 Medical Rec #:  [id number]             Height:       69.0 in Accession #:    [id number]            Weight:       102.0 lb Date of Birth:  [birth year]              BSA:          1.552 m Patient Age:    63 years              BP:           112/67 mmHg Patient Gender: M                     HR:           64 bpm. Exam Location:  [facility] Procedure: 2D Echo, Cardiac Doppler and Color Doppler (Both Spectral and Color            Flow Doppler were utilized during procedure). Indications:     Elevated troponin  History:         Patient has no prior history of Echocardiogram examinations.                  Previous Myocardial Infarction, COPD, Signs/Symptoms:Dyspnea;                  Risk Factors:Hypertension.  Sonographer:     Christopher Furnace Referring Phys:  [id number] DELAYNE LULLA SOLIAN Diagnosing Phys: Marsa Dooms MD  Sonographer Comments: Image acquisition challenging due to patient body habitus. IMPRESSIONS  1. Left ventricular ejection fraction, by  estimation, is 40 to 45%. The left ventricle has mildly decreased function. The left ventricle has no regional wall motion abnormalities. Left ventricular diastolic parameters were normal.  2. Right ventricular systolic function is normal. The right ventricular size is normal.  3. The mitral valve is normal in structure. Mild to moderate mitral valve regurgitation. No evidence of mitral stenosis.  4. The aortic valve is normal in structure. Aortic valve regurgitation is mild. No aortic stenosis is present.  5. The inferior vena cava is normal in size with greater than 50% respiratory variability, suggesting right atrial pressure of 3 mmHg. FINDINGS  Left Ventricle: Left ventricular ejection fraction, by estimation, is 40 to 45%. The left ventricle has mildly decreased function. The left ventricle has no regional wall motion abnormalities. Strain was performed and the global longitudinal strain is indeterminate. The left ventricular internal cavity size was normal in size. There is no left ventricular hypertrophy. Left ventricular diastolic parameters were normal. Right Ventricle: The right ventricular size is normal. No increase in right ventricular wall thickness. Right ventricular systolic function is normal. Left Atrium: Left atrial size was normal in size. Right Atrium: Right atrial size was normal in size. Pericardium: There is no evidence of pericardial effusion. Mitral Valve: The mitral valve is normal in structure. Mild to moderate mitral valve regurgitation. No evidence of mitral valve stenosis. Tricuspid Valve: The tricuspid valve is normal in structure. Tricuspid valve regurgitation is mild . No evidence of tricuspid stenosis. Aortic Valve: The aortic valve is normal in structure. Aortic valve regurgitation is mild. No aortic stenosis is present. Aortic valve mean gradient measures 2.5 mmHg. Aortic valve peak gradient measures 4.6 mmHg.  Aortic valve area, by VTI measures 1.57 cm. Pulmonic Valve: The  pulmonic valve was normal in structure. Pulmonic valve regurgitation is not visualized. No evidence of pulmonic stenosis. Aorta: The aortic root is normal in size and structure. Venous: The inferior vena cava is normal in size with greater than 50% respiratory variability, suggesting right atrial pressure of 3 mmHg. IAS/Shunts: No atrial level shunt detected by color flow Doppler. Additional Comments: 3D was performed not requiring image post processing on an independent workstation and was abnormal.  LEFT VENTRICLE PLAX 2D LVIDd:         5.10 cm      Diastology LVIDs:         4.20 cm      LV e' medial:    9.57 cm/s LV PW:         1.00 cm      LV E/e' medial:  8.8 LV IVS:        1.00 cm      LV e' lateral:   14.00 cm/s LVOT diam:     2.20 cm      LV E/e' lateral: 6.0 LV SV:         32 LV SV Index:   21 LVOT Area:     3.80 cm  LV Volumes (MOD) LV vol d, MOD A4C: 122.0 ml LV vol s, MOD A4C: 73.9 ml LV SV MOD A4C:     122.0 ml RIGHT VENTRICLE RV Basal diam:  3.70 cm RV Mid diam:    4.30 cm RV S prime:     9.36 cm/s TAPSE (M-mode): 2.2 cm LEFT ATRIUM             Index        RIGHT ATRIUM           Index LA diam:        3.30 cm 2.13 cm/m   RA Area:     13.40 cm LA Vol (A2C):   80.5 ml 51.87 ml/m  RA Volume:   33.70 ml  21.72 ml/m LA Vol (A4C):   43.3 ml 27.90 ml/m LA Biplane Vol: 58.8 ml 37.89 ml/m  AORTIC VALVE AV Area (Vmax):    1.47 cm AV Area (Vmean):   1.42 cm AV Area (VTI):     1.57 cm AV Vmax:           107.50 cm/s AV Vmean:          73.050 cm/s AV VTI:            0.206 m AV Peak Grad:      4.6 mmHg AV Mean Grad:      2.5 mmHg LVOT Vmax:         41.50 cm/s LVOT Vmean:        27.300 cm/s LVOT VTI:          0.085 m LVOT/AV VTI ratio: 0.41  AORTA Ao Root diam: 2.90 cm MITRAL VALVE               TRICUSPID VALVE MV Area (PHT): 3.53 cm    TR Peak grad:   44.6 mmHg MV Decel Time: 215 msec    TR Vmax:        334.00 cm/s MV E velocity: 84.40 cm/s MV A velocity: 56.10 cm/s  SHUNTS MV E/A ratio:  1.50         Systemic VTI:  0.08 m  Systemic Diam: 2.20 cm Marsa Dooms MD Electronically signed by Marsa Dooms MD Signature Date/Time: 04/16/2024/12:57:44 PM    Final    US  RENAL Result Date: 04/16/2024 CLINICAL DATA:  [id number] Acute kidney failure [facility]) [id number] EXAM: RENAL / URINARY TRACT ULTRASOUND COMPLETE COMPARISON:  04/16/2024 CT of the chest, abdomen, and pelvis FINDINGS: Right Kidney: Renal measurements: 8.9 x 4.4 x 5.4 cm = volume: 110 mL.Normal echogenicity. No mass. No hydronephrosis or nephrolithiasis. Left Kidney: Renal measurements: 9.1 x 4.9 x 3.7 cm = volume: 85 mL. Normal echogenicity. No mass. No hydronephrosis or nephrolithiasis. Bladder: Appears normal for degree of bladder distention. Other: None. IMPRESSION: No hydronephrosis or nephrolithiasis. Electronically Signed   By: Rogelia Myers M.D.   On: 04/16/2024 12:26   CT CHEST ABDOMEN PELVIS WO CONTRAST Result Date: 04/16/2024 EXAM: CT CHEST, ABDOMEN AND PELVIS WITHOUT CONTRAST 04/16/2024 12:34:20 AM TECHNIQUE: CT of the chest, abdomen and pelvis was performed without the administration of intravenous contrast. Multiplanar reformatted images are provided for review. Automated exposure control, iterative reconstruction, and/or weight based adjustment of the mA/kV was utilized to reduce the radiation dose to as low as reasonably achievable. COMPARISON: 09/08/2022 CLINICAL HISTORY: Eval for malignancy. Pt reports leg weakness for the past 2 weeks, pt reports he feels dehydrated but denies change in eating or drinking. Pt denies dysuria, cough congestion or fever. FINDINGS: CHEST: MEDIASTINUM AND LYMPH NODES: Heart and pericardium are unremarkable. The central airways are clear. No mediastinal, hilar or axillary lymphadenopathy. LUNGS AND PLEURA: No focal consolidation or pulmonary edema. No pleural effusion or pneumothorax. Emphysema. ABDOMEN AND PELVIS: LIVER: Hepatic steatosis. GALLBLADDER AND BILE DUCTS:  Cholelithiasis. No evidence of acute cholecystitis. SPLEEN: No acute abnormality. PANCREAS: Pancreatic calcifications compatible with sequelae of chronic pancreatitis. ADRENAL GLANDS: No acute abnormality. KIDNEYS, URETERS AND BLADDER: No stones in the kidneys or ureters. No hydronephrosis. No perinephric or periureteral stranding. Urinary bladder is unremarkable. GI AND BOWEL: Stomach demonstrates no acute abnormality. There is no bowel obstruction. Diverticulosis without evidence of diverticulitis. Normal appendix. REPRODUCTIVE ORGANS: No acute abnormality. PERITONEUM AND RETROPERITONEUM: No ascites. No free air. VASCULATURE: Coronary artery and aortic atherosclerotic calcification. Aorta is normal in caliber. Low-density blood pool compatible with anemia. ABDOMINAL AND PELVIS LYMPH NODES: No lymphadenopathy. REPRODUCTIVE ORGANS: No acute abnormality. BONES AND SOFT TISSUES: No acute osseous abnormality. No focal soft tissue abnormality. Bilateral femoral head AVN. IMPRESSION: 1. No acute findings. 2. Emphysema. 3. Hepatic steatosis. 4. Pancreatic calcifications compatible with sequelae of chronic pancreatitis. 5. Cholelithiasis without evidence of acute cholecystitis. 6. Diverticulosis without evidence of diverticulitis. 7. Bilateral femoral head AVN. Electronically signed by: Norman Gatlin MD 04/16/2024 12:54 AM EDT RP Workstation: [id number]     Medications:    sodium chloride  50 mL/hr at 04/16/24 2344    blood pressure control book   Does not apply Once   folic acid   1 mg Oral Daily   multivitamin with minerals  1 tablet Oral Daily   nicotine   21 mg Transdermal Daily   pantoprazole  (PROTONIX ) IV  40 mg Intravenous Q12H   sodium chloride  flush  3 mL Intravenous Q12H   thiamine   100 mg Oral Daily   Or   thiamine   100 mg Intravenous Daily   acetaminophen  **OR** acetaminophen , LORazepam  **OR** LORazepam , ondansetron  **OR** ondansetron  (ZOFRAN ) IV  Assessment/ Plan:  Mr. Bernard Harris  is a 63 y.o.  male  with past medical conditions including tobacco and nicotine  abuse, CAD, hypertension, COPD, who was admitted to [facility] on 04/15/2024 for  Hypokalemia [E87.6] Hypomagnesemia [E83.42] Acute blood loss anemia [D62] Cachectic (HCC) [R64] Diverticulosis [K57.90] Gallstones [K80.20] Upper GI bleed [K92.2] Elevated troponin [R79.89] Symptomatic anemia [D64.9] Acute renal failure, unspecified acute renal failure type (HCC) [N17.9] Alcohol use disorder [F10.90]   Acute kidney injury secondary to excessive NSAID use. Normal function noted in April of this year. Creatinine 6.4 on ED arrival. Patient reports 1400 mg of ibuprofen twice daily for at least 3 weeks. CT abdomen chest pelvis negative for obstruction. Renal US  unremarkable.  Creatinine has shown some improvements with IVF. Encouraged to maintain oral intake and will continue to monitor. Continue to avoid nephrotoxic agents and therapies. No acute indication for dialysis.   Lab Results  Component Value Date   CREATININE 3.24 (H) 04/17/2024   CREATININE 5.63 (H) 04/16/2024   CREATININE 6.44 (H) 04/15/2024    Intake/Output Summary (Last 24 hours) at 04/17/2024 1331 Last data filed at 04/17/2024 0700 Gross per 24 hour  Intake 2046.33 ml  Output 2540 ml  Net -493.67 ml   2.  Hypokalemia/hyponatremia/hypomagnesia likely secondary to kidney injury. Electrolytes have corrected with supplementation and kidney injury. Will continue to monitor.   3. Anemia due to acute blood loss, recent NSAID use.  Hemoglobin 5.4 on admission.  GI performed EGD today, no intervention required.    4.  Chronic systolic heart failure, echo completed today shows EF 40 to 45% with a mild to moderate MVR and mild AVR.    LOS: 1 Bridgette Wolden 9/13/20251:31 PM

## 2024-04-17 NOTE — Op Note (Signed)
 [facility] Gastroenterology Patient Name: Bernard Harris Procedure Date: 04/17/2024 9:35 AM MRN: [id number] Account #: [id number] Date of Birth: 1960/12/29 Admit Type: Inpatient Age: 63 Room: [facility] ENDO ROOM 4 Gender: Male Note Status: Finalized Instrument Name: Upper GI Scope [id number] Procedure:             Upper GI endoscopy Indications:           Acute post hemorrhagic anemia Providers:             Ruel Kung MD, MD Referring MD:          Alm HERO. Glover, MD (Referring MD) Medicines:             Monitored Anesthesia Care Complications:         No immediate complications. Procedure:             Pre-Anesthesia Assessment:                        - Prior to the procedure, a History and Physical was                         performed, and patient medications, allergies and                         sensitivities were reviewed. The patient's tolerance                         of previous anesthesia was reviewed.                        - The risks and benefits of the procedure and the                         sedation options and risks were discussed with the                         patient. All questions were answered and informed                         consent was obtained.                        - ASA Grade Assessment: III - A patient with severe                         systemic disease.                        After obtaining informed consent, the endoscope was                         passed under direct vision. Throughout the procedure,                         the patient's blood pressure, pulse, and oxygen                         saturations were monitored continuously. The Endoscope  was introduced through the mouth, and advanced to the                         third part of duodenum. The upper GI endoscopy was                         accomplished with ease. The patient tolerated the                         procedure well. Findings:       The esophagus was normal.      A large amount of food (residue) was found in the entire examined       stomach.      no fresh or old blood seen in stomach      The examined duodenum was normal. Impression:            - Normal esophagus.                        - A large amount of food (residue) in the stomach.                        - Normal examined duodenum.                        - No specimens collected. Recommendation:        - Return patient to hospital ward for ongoing care.                        - Liquid diet saturday and sunday and npo from                         midnight of sunday for egd monday.                        - large qty of food in stomach but no active bleeding.                         liquid diet today and tomorrow which should hopefully                         clear the contents by Monday. No fresh or old blood                         seen in stomach or duodenum. Continue ppi Procedure Code(s):     --- Professional ---                        43 235, Esophagogastroduodenoscopy, flexible,                         transoral; diagnostic, including collection of                         specimen(s) by brushing or washing, when performed                         (separate procedure) Diagnosis Code(s):     --- Professional ---  D62, Acute posthemorrhagic anemia CPT copyright 2022 American Medical Association. All rights reserved. The codes documented in this report are preliminary and upon coder review may  be revised to meet current compliance requirements. Ruel Kung, MD Ruel Kung MD, MD 04/17/2024 9:46:08 AM This report has been signed electronically. Number of Addenda: 0 Note Initiated On: 04/17/2024 9:35 AM Estimated Blood Loss:  Estimated blood loss: none.      [facility]

## 2024-04-17 NOTE — Progress Notes (Signed)
 Progress Note   Patient: Bernard Harris FMW:[id number] DOB: [birth year] DOA: 04/15/2024     1 DOS: the patient was seen and examined on 04/17/2024      Brief hospital course: From HPI Bernard Harris is a 63 y.o. male with medical history significant for COPD, tobacco use disorder, COPD, HTN, CAD with history of stent 2011, being admitted with melena and acute blood loss anemia(Hb 5.4) as well as severe AKI(creatinine 6.44) believe NSAID related.  Patient presented with 2 weeks of fatigue and worsening dyspnea on exertion.  He has noted black stool for the past week.  Denies abdominal pain,. emesis.  Denies chest pain. Reports he has been having knee pain from a fall 3 weeks ago and has been using ibuprofen up to 7 tablets twice a day based on what a friend told him his doctor prescribed for similar problem. Endorses drinking anywhere from 1-5 drinks of vodka daily but has not done so recently due to the weakness(states half gallon of vodka lasts 2 to 3 weeks) In the ED, vitals initially within normal limits however last night progressed BP started trending down to as low as 78/52, improving somewhat with fluids. Labs notable for hemoglobin of 5.4, with most recent of 7.8 back in April 2025. Creatinine noted to be 6.44 most recently 0.7 in April  Bicarb of 16, anion gap of 17, normal LFTs Sodium 130 and potassium 3.2, magnesium  1.4 EtOH less than 15 Troponin 277--255 UDS pending EKG showing NSR at 66 with nonspecific ST-T wave changes CT chest abdomen pelvis without with no acute findings.  Does show hepatic steatosis, cholelithiasis without cholecystitis, pancreatic calcifications compatible with chronic pancreatitis.  Patient typed and crossed and started on 1 of 3 units PRBCs, given an NS bolus, IV mag repletion, IV Protonix  80 mg. Admission requested        Assessment and Plan:  Melena NSAID induced gastropathy/alcoholic gastritis Acute on likely chronic blood loss anemia   Hypotension secondary to hypovolemia/acute blood loss hemoglobin of 5.4, with most recent of 7.8 back in April 2025. Continue transfusion of 3 units PRBCs Continue IV Protonix  and octreotide  Patient has been seen by GI with plans to have EGD when clinically stable   AKI (acute kidney injury) (HCC) Anion gap metabolic acidosis Suspect NSAID induced nephropathy Suspect ATN/(?interstitial nephritis) related to NSAIDs in combination with hypovolemia/hypotension from blood loss CT chest abdomen and pelvis with no concerning findings and kidneys/urinary system Received a 2 L fluid bolus in the ED Receiving blood transfusions Monitor renal function and avoid nephrotoxins I discussed the plan of care with nephrology   Electrolyte abnormality Hypokalemia, hypomagnesemia and hyponatremia Likely related to hypovolemia, hypotension, alcohol use Pharmacy consulted for assistance with electrolyte management   CAD S/P percutaneous coronary angioplasty Elevated troponin Troponin elevated in the 200s but downtrending, likely supply/demand mismatch Will hold aspirin due to bleeding Hold metoprolol, lisinopril, nitro due to hypotension Follow-up on echo   Cachexia (HCC) Suspect severe protein calorie malnutrition related to alcohol use disorder BMI 15     Tobacco use disorder Nicotine  patch   Alcohol use disorder Given thiamine  folate and multivitamin in the ED CIWA withdrawal protocol   Bilateral foot gangrene I discussed the case with podiatry Vascular surgery consulted   DVT prophylaxis: SCD   Consults: GI, nephrology   Advance Care Planning:   Code Status: Full Code    Family Communication: nephew at bedside   Disposition Plan: Back to previous home environment  Subjective:  Patient seen and examined at bedside this morning Patient denies nausea vomiting abdominal pain chest pain  Physical Exam: Vitals and nursing note reviewed.  Constitutional:      General: He is  awake. He is not in acute distress.    Appearance: He is cachectic.  HENT:     Head: Normocephalic and atraumatic.  Cardiovascular:     Rate and Rhythm: Normal rate and regular rhythm.     Heart sounds: Normal heart sounds.  Pulmonary:     Effort: Pulmonary effort is normal.     Breath sounds: Normal breath sounds.  Abdominal:     Palpations: Abdomen is soft.     Tenderness: There is no abdominal tenderness.  Neurological:     Mental Status: He is alert. Mental status is at baseline.     Data Reviewed:     Latest Ref Rng & Units 04/17/2024   11:55 AM 04/17/2024    7:28 AM 04/17/2024    5:53 AM  CBC  Hemoglobin 13.0 - 17.0 g/dL 87.3  88.0  88.2        Latest Ref Rng & Units 04/17/2024    7:28 AM 04/16/2024   12:58 PM 04/15/2024    8:33 PM  BMP  Glucose 70 - 99 mg/dL 814  889  866   BUN 8 - 23 mg/dL 54  73  79   Creatinine 0.61 - 1.24 mg/dL 6.75  4.36  3.55   Sodium 135 - 145 mmol/L 137  134  130   Potassium 3.5 - 5.1 mmol/L 3.7  4.6  3.2   Chloride 98 - 111 mmol/L 107  104  97   CO2 22 - 32 mmol/L 17  14  16    Calcium 8.9 - 10.3 mg/dL 7.5  7.3  7.9     Vitals:   04/17/24 0948 04/17/24 1000 04/17/24 1014 04/17/24 1200  BP: 104/72 103/75 127/79 138/88  Pulse: 81 74  82  Resp: (!) 23 15 (!) 21 18  Temp: (!) 97.2 F (36.2 C) 98 F (36.7 C) 98 F (36.7 C) (!) 97.5 F (36.4 C)  TempSrc:      SpO2: 100% 100% 96% (!) 72%  Weight:      Height:        Author: Drue ONEIDA Potter, MD 04/17/2024 3:49 PM  For on call review [email or web address].

## 2024-04-17 NOTE — Plan of Care (Signed)
  Problem: Education: Goal: Knowledge of condition and prescribed therapy will improve Outcome: Progressing   Problem: Clinical Measurements: Goal: Ability to maintain clinical measurements within normal limits will improve Outcome: Progressing Goal: Will remain free from infection Outcome: Progressing   Problem: Coping: Goal: Level of anxiety will decrease Outcome: Progressing   Problem: Safety: Goal: Ability to remain free from injury will improve Outcome: Progressing   Problem: Skin Integrity: Goal: Risk for impaired skin integrity will decrease Outcome: Progressing

## 2024-04-17 NOTE — H&P (Signed)
 Bernard Harris , MD [address], [address], [place], [place], [address] Phone: [phone] Fax: [phone]  Primary Care Physician:  Bernard Lenis, MD   Pre-Procedure History & Physical: HPI:  Bernard Harris is a 63 y.o. male is here for an endoscopy    Past Medical History:  Diagnosis Date   COPD (chronic obstructive pulmonary disease) (HCC)    Coronary artery disease    Current every day smoker    Dyspnea    with exertion   GERD (gastroesophageal reflux disease)    Heart attack (HCC)    X 2   Hyperlipidemia    Hypertension     Past Surgical History:  Procedure Laterality Date   CARDIAC CATHETERIZATION     COLONOSCOPY N/A 01/09/2022   Procedure: COLONOSCOPY;  Surgeon: Toledo, Ladell POUR, MD;  Location: [facility] ENDOSCOPY;  Service: Gastroenterology;  Laterality: N/A;   CORONARY STENT PLACEMENT     FOOT SURGERY Bilateral 1980's    Prior to Admission medications   Medication Sig Start Date End Date Taking? Authorizing Provider  acamprosate (CAMPRAL) 333 MG tablet Take 666 mg by mouth 3 (three) times daily with meals.   Yes [provider]  amLODipine (NORVASC) 5 MG tablet Take 5 mg by mouth daily. 11/04/23  Yes [provider]  aspirin EC 81 MG tablet Take 81 mg by mouth daily.   Yes [provider]  cetirizine (ZYRTEC) 10 MG tablet Take 10 mg by mouth daily as needed for allergies.   Yes [provider]  cyanocobalamin  1000 MCG tablet Take by mouth.   Yes [provider]  fluticasone (FLONASE) 50 MCG/ACT nasal spray Place 2 sprays into both nostrils daily as needed for allergies.   Yes [provider]  lisinopril (PRINIVIL,ZESTRIL) 20 MG tablet Take 20 mg by mouth daily.  01/29/16  Yes [provider]  magnesium  oxide (MAG-OX) 400 MG tablet Take 400 mg by mouth daily.   Yes [provider]  metoprolol tartrate (LOPRESSOR) 50 MG tablet Take 50 mg by mouth 2 (two) times daily.   Yes [provider]  nitroGLYCERIN (NITROSTAT) 0.4 MG SL tablet Place 0.4 mg under the tongue every 5 (five) minutes as needed for chest pain.   Yes [provider]  Omega-3 Fatty Acids (FISH OIL) 1200 MG CAPS Take 1,200 mg by mouth daily.    Yes [provider]  omeprazole (PRILOSEC) 20 MG capsule Take 20 mg by mouth daily.   Yes [provider]  simvastatin (ZOCOR) 20 MG tablet Take 20 mg by mouth at bedtime.    Yes [provider]  oxyCODONE -acetaminophen  (PERCOCET) 5-325 MG tablet Take 1 tablet by mouth every 4 (four) hours as needed. Patient not taking: Reported on 04/16/2024 02/13/21   Bernard Leash, MD    Allergies as of 04/15/2024   (No Known Allergies)    History reviewed. No pertinent family history.  Social History   Socioeconomic History   Marital status: Single    Spouse name: Not on file   Number of children: Not on file   Years of education: Not on file   Highest education level: Not on file  Occupational History   Not on file  Tobacco Use   Smoking status: Every Day    Current packs/day: 0.25    Average packs/day: 0.3 packs/day for 48.0 years (12.0 ttl pk-yrs)    Types: Cigarettes   Smokeless tobacco: Never  Vaping Use   Vaping status: Never  Used  Substance and Sexual Activity   Alcohol use: Yes    Alcohol/week: 3.0 - 5.0 standard drinks of alcohol    Types: 2 - 3 Cans of beer, 1 - 2 Shots of liquor per week    Comment: daily   Drug use: No   Sexual activity: Not on file  Other Topics Concern   Not on file  Social History Narrative   Not on file   Social Drivers of Health   Financial Resource Strain: Patient Declined (11/04/2023)   Received from [facility]   Overall Financial Resource Strain (CARDIA)    Difficulty of Paying Living Expenses: Patient declined  Food Insecurity: Food Insecurity Present (04/17/2024)   Hunger Vital Sign    Worried About Running Out of Food in the Last Year: Sometimes true     Ran Out of Food in the Last Year: Never true  Transportation Needs: No Transportation Needs (04/17/2024)   PRAPARE - Administrator, Civil Service (Medical): No    Lack of Transportation (Non-Medical): No  Physical Activity: Not on file  Stress: Not on file  Social Connections: Moderately Integrated (04/17/2024)   Social Connection and Isolation Panel    Frequency of Communication with Friends and Family: More than three times a week    Frequency of Social Gatherings with Friends and Family: Twice a week    Attends Religious Services: 1 to 4 times per year    Active Member of [employer] or Organizations: Yes    Attends Engineer, structural: More than 4 times per year    Marital Status: Never married  Intimate Partner Violence: Not At Risk (04/17/2024)   Humiliation, Afraid, Rape, and Kick questionnaire    Fear of Current or Ex-Partner: No    Emotionally Abused: No    Physically Abused: No    Sexually Abused: No    Review of Systems: See HPI, otherwise negative ROS  Physical Exam: BP 125/82   Pulse 67   Temp (!) 97 F (36.1 C) (Temporal)   Resp 17   Ht 5' 9 (1.753 m)   Wt 45.4 kg   SpO2 100%   BMI 14.78 kg/m  General:   Alert,  pleasant and cooperative in NAD Head:  Normocephalic and atraumatic. Neck:  Supple; no masses or thyromegaly. Lungs:  Clear throughout to auscultation, normal respiratory effort.    Heart:  +S1, +S2, Regular rate and rhythm, No edema. Abdomen:  Soft, nontender and nondistended. Normal bowel sounds, without guarding, and without rebound.   Neurologic:  Alert and  oriented x4;  grossly normal neurologically.  Impression/Plan: Bernard Harris [facility] is here for an endoscopy  to be performed for  evaluation of melena    Risks, benefits, limitations, and alternatives regarding endoscopy have been reviewed with the patient.  Questions have been answered.  All parties agreeable.   Bernard Kung, MD  04/17/2024, 9:35 AM

## 2024-04-17 NOTE — Anesthesia Preprocedure Evaluation (Signed)
 Anesthesia Evaluation  Patient identified by MRN, date of birth, ID band Patient awake    Reviewed: Allergy & Precautions, NPO status , Patient's Chart, lab work & pertinent test results  Airway Mallampati: III  TM Distance: >3 FB Neck ROM: full    Dental  (+) Chipped, Poor Dentition, Dental Advidsory Given   Pulmonary shortness of breath and with exertion, COPD, neg recent URI, Current Smoker and Patient abstained from smoking.   Pulmonary exam normal        Cardiovascular Exercise Tolerance: Poor hypertension, (-) angina + CAD, + Past MI, + Cardiac Stents and + DOE  Normal cardiovascular exam(-) dysrhythmias (-) Valvular Problems/Murmurs     Neuro/Psych negative neurological ROS  negative psych ROS   GI/Hepatic ,GERD  Controlled,,(+)     substance abuse  alcohol use  Endo/Other  negative endocrine ROS    Renal/GU negative Renal ROS  negative genitourinary   Musculoskeletal   Abdominal Normal abdominal exam  (+)   Peds  Hematology negative hematology ROS (+)   Anesthesia Other Findings Past Medical History: No date: COPD (chronic obstructive pulmonary disease) (HCC) No date: Coronary artery disease No date: Current every day smoker No date: Dyspnea     Comment:  with exertion No date: GERD (gastroesophageal reflux disease) No date: Heart attack (HCC)     Comment:  X 2 No date: Hyperlipidemia No date: Hypertension  Past Surgical History: No date: CARDIAC CATHETERIZATION No date: CORONARY STENT PLACEMENT 1980's: FOOT SURGERY; Bilateral  BMI    Body Mass Index: 17.28 kg/m      Reproductive/Obstetrics negative OB ROS                              Anesthesia Physical Anesthesia Plan  ASA: 3  Anesthesia Plan: General   Post-op Pain Management: Minimal or no pain anticipated   Induction: Intravenous  PONV Risk Score and Plan: Propofol  infusion and TIVA  Airway  Management Planned: Natural Airway  Additional Equipment:   Intra-op Plan:   Post-operative Plan:   Informed Consent: I have reviewed the patients History and Physical, chart, labs and discussed the procedure including the risks, benefits and alternatives for the proposed anesthesia with the patient or authorized representative who has indicated his/her understanding and acceptance.     Dental Advisory Given  Plan Discussed with: Anesthesiologist, CRNA and Surgeon  Anesthesia Plan Comments:          Anesthesia Quick Evaluation

## 2024-04-17 NOTE — Plan of Care (Signed)

## 2024-04-17 NOTE — Consult Note (Signed)
 PODIATRY CONSULTATION  NAME Bernard Harris MRN [id number] DOB 1961/05/31 DOA 04/15/2024   Reason for consult:  Chief Complaint  Patient presents with   Weakness    Attending/Consulting physician: MYRTIS Potter MD  History of present illness: Bernard Harris is a 63 y.o. male with medical history significant for COPD, tobacco use disorder, COPD, HTN, CAD with history of stent 2011, being admitted with melena and acute blood loss anemia(Hb 5.4) as well as severe AKI(creatinine 6.44) believe NSAID related.  Patient presented with 2 weeks of fatigue and worsening dyspnea on exertion.  He has noted black stool for the past week.  Denies abdominal pain,. emesis.  Denies chest pain. Reports he has been having knee pain from a fall 3 weeks ago and has been using ibuprofen up to 7 tablets twice a day based on what a friend told him his doctor prescribed for similar problem. Endorses drinking anywhere from 1-5 drinks of vodka daily but has not done so recently due to the weakness(states half gallon of vodka lasts 2 to 3 weeks)  Patient unable to provide me with much history regarding his feet he says that he was at some kind of rehab facility of some sort and they neglected his footcare.  He reports this is why they look like they do.  He reports significant pain with both feet.  Past Medical History:  Diagnosis Date   COPD (chronic obstructive pulmonary disease) (HCC)    Coronary artery disease    Current every day smoker    Dyspnea    with exertion   GERD (gastroesophageal reflux disease)    Heart attack (HCC)    X 2   Hyperlipidemia    Hypertension        Latest Ref Rng & Units 04/17/2024    7:28 AM 04/17/2024    5:53 AM 04/17/2024   12:59 AM  CBC  Hemoglobin 13.0 - 17.0 g/dL 88.0  88.2  88.2        Latest Ref Rng & Units 04/17/2024    7:28 AM 04/16/2024   12:58 PM 04/15/2024    8:33 PM  BMP  Glucose 70 - 99 mg/dL 814  889  866   BUN 8 - 23 mg/dL 54  73  79    Creatinine 0.61 - 1.24 mg/dL 6.75  4.36  3.55   Sodium 135 - 145 mmol/L 137  134  130   Potassium 3.5 - 5.1 mmol/L 3.7  4.6  3.2   Chloride 98 - 111 mmol/L 107  104  97   CO2 22 - 32 mmol/L 17  14  16    Calcium 8.9 - 10.3 mg/dL 7.5  7.3  7.9       Physical Exam: Lower Extremity Exam  Palpable DP pulse bilateral foot.  Absent PT pulse nonpalpable bilateral  Severe xerosis bilateral plantar foot as well as the dorsal forefoot.  Evidence of extreme neglect of personal hygiene.  Thickened dystrophic elongated nails with koilonychia.  No draining wounds.  Difficult to assess for open wounds and actual gangrene versus fungal debris and xerosis given patient pain with palpation of all digits.  Sensation intact to forefoot bilaterally pain on palpation  Digital deformity is noted though no prior amputations reported.        ASSESSMENT/PLAN OF CARE 63 y.o. 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  Aside from fungal infection no evidence of acute soft tissue infection or osteomyelitis  WBC 9.3 ABI studies: Ordered X-ray bilateral foot: Ordered  -Will plan to take patient to the OR possibly Monday for debridement, possible transmetatarsal amputation though at this point I am leaning towards debriding and cleaning his foot first before determining plan for possible transmetatarsal amputation if indicated.  If x-rays are negative for acute osteomyelitis and there does not appear to be active wounds after initial debridement he may not require this. -Ordered ABI screening.  Appreciate vascular input he does have palpable DP pulse however his PT pulses I could not palpate,  - Okay to hold  antibiotics at this time - Anticoagulation: Continue per primary recommendations for now - Wound care: None preop - WB status: Weightbearing as tolerated will order postop shoes - Will continue to follow   Thank you for the consult.  Please contact me directly with any questions or concerns.           Bernard Harris, DPM [facility]    [address] [place], [place] [address]                Office [phone]  Fax [phone]

## 2024-04-17 NOTE — Transfer of Care (Signed)
 Immediate Anesthesia Transfer of Care Note  Patient: Bernard Harris [facility]  Procedure(s) Performed: EGD (ESOPHAGOGASTRODUODENOSCOPY)  Patient Location: PACU  Anesthesia Type:General  Level of Consciousness: drowsy  Airway & Oxygen Therapy: Patient Spontanous Breathing and Patient connected to nasal cannula oxygen  Post-op Assessment: Report given to RN and Post -op Vital signs reviewed and stable  Post vital signs: Reviewed  Last Vitals:  Vitals Value Taken Time  BP 104/72   Temp 97.83f   Pulse 83   Resp 14   SpO2 100     Last Pain:  Vitals:   04/17/24 0906  TempSrc: Temporal  PainSc: 0-No pain         Complications: No notable events documented.

## 2024-04-17 NOTE — Consult Note (Signed)
 VASCULAR SURGERY CONSULTATION   Requested by: Drue Potter, MD  Reason for consultation: BILATERAL foot wounds   History of Present Illness   Bernard Harris is a 63 y.o. (March 03, 1961) male who presents with cc: feeling weak.  Pt admitted with multiple medical issues.  Podiatry evaluated patient for chronic foot findings and suggested vascular consult.  Pt notes chronic weight loss related to loss of appetite.  Pt denies CMI sx.  Pt denies claudication or rest pain.  Pt has limited ambulation  Past Medical History:  Diagnosis Date   COPD (chronic obstructive pulmonary disease) (HCC)    Coronary artery disease    Current every day smoker    Dyspnea    with exertion   GERD (gastroesophageal reflux disease)    Heart attack (HCC)    X 2   Hyperlipidemia    Hypertension     Past Surgical History:  Procedure Laterality Date   CARDIAC CATHETERIZATION     COLONOSCOPY N/A 01/09/2022   Procedure: COLONOSCOPY;  Surgeon: Toledo, Ladell POUR, MD;  Location: [facility] ENDOSCOPY;  Service: Gastroenterology;  Laterality: N/A;   CORONARY STENT PLACEMENT     FOOT SURGERY Bilateral 1980's     Social History   Socioeconomic History   Marital status: Single    Spouse name: Not on file   Number of children: Not on file   Years of education: Not on file   Highest education level: Not on file  Occupational History   Not on file  Tobacco Use   Smoking status: Every Day    Current packs/day: 0.25    Average packs/day: 0.3 packs/day for 48.0 years (12.0 ttl pk-yrs)    Types: Cigarettes   Smokeless tobacco: Never  Vaping Use   Vaping status: Never Used  Substance and Sexual Activity   Alcohol use: Yes    Alcohol/week: 3.0 - 5.0 standard drinks of alcohol    Types: 2 - 3 Cans of beer, 1 - 2 Shots of liquor per week    Comment: daily   Drug use: No   Sexual activity: Not on file  Other Topics Concern   Not on file  Social History Narrative   Not on file   Social Drivers of  Health   Financial Resource Strain: Patient Declined (11/04/2023)   Received from [facility]   Overall Financial Resource Strain (CARDIA)    Difficulty of Paying Living Expenses: Patient declined  Food Insecurity: Food Insecurity Present (04/17/2024)   Hunger Vital Sign    Worried About Running Out of Food in the Last Year: Sometimes true    Ran Out of Food in the Last Year: Never true  Transportation Needs: No Transportation Needs (04/17/2024)   PRAPARE - Administrator, Civil Service (Medical): No    Lack of Transportation (Non-Medical): No  Physical Activity: Not on file  Stress: Not on file  Social Connections: Moderately Integrated (04/17/2024)   Social Connection and Isolation Panel    Frequency of Communication with Friends and Family: More than three times a week    Frequency of Social Gatherings with Friends and Family: Twice a week    Attends Religious Services: 1 to 4 times per year    Active Member of [employer] or Organizations: Yes    Attends Banker Meetings: More than 4 times per year    Marital Status: Never married  Intimate Partner Violence: Not At Risk (04/17/2024)   Humiliation,  Afraid, Rape, and Kick questionnaire    Fear of Current or Ex-Partner: No    Emotionally Abused: No    Physically Abused: No    Sexually Abused: No   History reviewed. No pertinent family history.  Current Facility-Administered Medications  Medication Dose Route Frequency Provider Last Rate Last Admin   0.9 %  sodium chloride  infusion   Intravenous Continuous Druscilla Bald, NP 50 mL/hr at 04/17/24 1706 New Bag at 04/17/24 1706   acetaminophen  (TYLENOL ) tablet 650 mg  650 mg Oral Q6H PRN Duncan, Hazel V, MD       Or   acetaminophen  (TYLENOL ) suppository 650 mg  650 mg Rectal Q6H PRN Cleatus Delayne GAILS, MD       blood pressure control book   Does not apply Once Cleatus Delayne GAILS, MD       folic acid  (FOLVITE ) tablet 1 mg  1 mg Oral Daily Duncan, Hazel  V, MD   1 mg at 04/17/24 1034   LORazepam  (ATIVAN ) tablet 1-4 mg  1-4 mg Oral Q1H PRN Duncan, Hazel V, MD       Or   LORazepam  (ATIVAN ) injection 1-4 mg  1-4 mg Intravenous Q1H PRN Duncan, Hazel V, MD       multivitamin with minerals tablet 1 tablet  1 tablet Oral Daily Cleatus Delayne GAILS, MD   1 tablet at 04/17/24 1034   nicotine  (NICODERM CQ  - dosed in mg/24 hours) patch 21 mg  21 mg Transdermal Daily Duncan, Hazel V, MD   21 mg at 04/17/24 1036   ondansetron  (ZOFRAN ) tablet 4 mg  4 mg Oral Q6H PRN Cleatus Delayne GAILS, MD       Or   ondansetron  (ZOFRAN ) injection 4 mg  4 mg Intravenous Q6H PRN Cleatus Delayne GAILS, MD       pantoprazole  (PROTONIX ) injection 40 mg  40 mg Intravenous Q12H Duncan, Hazel V, MD   40 mg at 04/17/24 1035   sodium chloride  flush (NS) 0.9 % injection 3 mL  3 mL Intravenous Q12H Cleatus Delayne GAILS, MD   3 mL at 04/16/24 2224   thiamine  (VITAMIN B1) tablet 100 mg  100 mg Oral Daily Duncan, Hazel V, MD   100 mg at 04/17/24 1036   Or   thiamine  (VITAMIN B1) injection 100 mg  100 mg Intravenous Daily Cleatus Delayne GAILS, MD        No Known Allergies  REVIEW OF SYSTEMS (negative unless checked):   Cardiac:  []  Chest pain or chest pressure? []  Shortness of breath upon activity? []  Shortness of breath when lying flat? []  Irregular heart rhythm?  Vascular:  []  Pain in calf, thigh, or hip brought on by walking? []  Pain in feet at night that wakes you up from your sleep? []  Blood clot in your veins? []  Leg swelling?  Pulmonary:  []  Oxygen at home? []  Productive cough? []  Wheezing?  Neurologic:  []  Sudden weakness in arms or legs? []  Sudden numbness in arms or legs? []  Sudden onset of difficult speaking or slurred speech? []  Temporary loss of vision in one eye? []  Problems with dizziness?  Gastrointestinal:  []  Blood in stool? []  Vomited blood?  Genitourinary:  []  Burning when urinating? []  Blood in urine?  Psychiatric:  []  Major depression  Hematologic:  []   Bleeding problems? []  Problems with blood clotting?  Dermatologic:  []  Rashes or ulcers?  Constitutional:  []  Fever or chills?  Ear/Nose/Throat:  []  Change in hearing? []  Nose bleeds? []   Sore throat?  Musculoskeletal:  []  Back pain? [x]  Joint pain? []  Muscle pain?   Physical Examination     Vitals:   04/17/24 1014 04/17/24 1200 04/17/24 1300 04/17/24 1703  BP: 127/79 138/88  119/81  Pulse:  82  78  Resp: (!) 21 18    Temp: 98 F (36.7 C) (!) 97.5 F (36.4 C)    TempSrc:      SpO2: 96% (!) 72% 100% 100%  Weight:      Height:       Body mass index is 14.78 kg/m.  General Alert, O x 3, Cachectic, Ill appearing  Vascular Vessel Right Left  Femoral Palpable Palpable  Popliteal Not palpable Not palpable  PT Faintly palpable Not palpable  DP Faintly palpable Faintly palpable    Musculo- skeletal Moves arm and legs to command, sensation intact, chronic nails changes and extensive dead skin, L>R foot, L foot looks viable, R great toe has a chronic callused ulcer    Non-invasive Vascular Imaging      ABI (04/16/24) R:  ABI: 1.24,  PT: bi DP: bi L:  ABI: 1.00 PT: tri DP: mono Laboratory   CBC    Latest Ref Rng & Units 04/17/2024    4:15 PM 04/17/2024   11:55 AM 04/17/2024    7:28 AM  CBC  Hemoglobin 13.0 - 17.0 g/dL 87.2  87.3  88.0     BMP    Latest Ref Rng & Units 04/17/2024    7:28 AM 04/16/2024   12:58 PM 04/15/2024    8:33 PM  BMP  Glucose 70 - 99 mg/dL 814  889  866   BUN 8 - 23 mg/dL 54  73  79   Creatinine 0.61 - 1.24 mg/dL 6.75  4.36  3.55   Sodium 135 - 145 mmol/L 137  134  130   Potassium 3.5 - 5.1 mmol/L 3.7  4.6  3.2   Chloride 98 - 111 mmol/L 107  104  97   CO2 22 - 32 mmol/L 17  14  16    Calcium 8.9 - 10.3 mg/dL 7.5  7.3  7.9      Radiology     DG Foot 2 Views Left Result Date: 04/17/2024 EXAM: 2 VIEW(S) XRAY OF THE LEFT FOOT 04/17/2024 11:53:34 AM COMPARISON: None available. CLINICAL HISTORY: Gangrene FINDINGS: BONES AND  JOINTS: No acute fracture. Marked skin distortion about the first, fourth and fifth toes with suspected subcutaneous emphysema limiting assessment of the phalanges. Mild DJD at the first MTP and interphalangeal joints. Diffuse osteopenia. SOFT TISSUES: Scattered vascular calcifications. IMPRESSION: 1. Marked skin distortion about the first, fourth, and fifth toes with suspected subcutaneous emphysema, limiting assessment of the phalanges. 2. Mild DJD at the first MTP and interphalangeal joints. 3. Diffuse osteopenia. 4. Scattered vascular calcifications. Electronically signed by: Dayne Hassell MD 04/17/2024 03:44 PM EDT RP Workstation: [id number]   DG Foot 2 Views Right Result Date: 04/17/2024 EXAM: 2 VIEW(S) XRAY OF THE RIGHT FOOT 04/17/2024 11:53:34 AM COMPARISON: None available. CLINICAL HISTORY: Gangrene FINDINGS: BONES AND JOINTS: Erosive changes of the distal aspect of the first digit distal phalanx with surrounding subcutaneous emphysema concerning for infection with gas-forming organism. Erosion/amputation of mid and distal phalanges fourth and fifth toes. Diffuse osteopenia. Mild midfoot degenerative changes. SOFT TISSUES: Surrounding soft tissue swelling. Vascular calcifications. IMPRESSION: 1. Erosive changes of the distal aspect of the first digit distal phalanx with surrounding subcutaneous emphysema, concerning for infection with gas-forming organism. 2. Erosion/amputation of  mid and distal phalanges of the fourth and fifth toes with surrounding soft tissue swelling. 3. Diffuse osteopenia. 4. Mild midfoot degenerative changes. 5. Vascular calcifications. Electronically signed by: Katheleen Faes MD 04/17/2024 03:41 PM EDT RP Workstation: [id number]   ECHOCARDIOGRAM COMPLETE Result Date: 04/16/2024    ECHOCARDIOGRAM REPORT   Patient Name:   ERICK MURIN Date of Exam: 04/16/2024 Medical Rec #:  [id number]             Height:       69.0 in Accession #:    [id number]            Weight:        102.0 lb Date of Birth:  November 21, 1960              BSA:          1.552 m Patient Age:    63 years              BP:           112/67 mmHg Patient Gender: M                     HR:           64 bpm. Exam Location:  [facility] Procedure: 2D Echo, Cardiac Doppler and Color Doppler (Both Spectral and Color            Flow Doppler were utilized during procedure). Indications:     Elevated troponin  History:         Patient has no prior history of Echocardiogram examinations.                  Previous Myocardial Infarction, COPD, Signs/Symptoms:Dyspnea;                  Risk Factors:Hypertension.  Sonographer:     Christopher Furnace Referring Phys:  [id number] DELAYNE LULLA SOLIAN Diagnosing Phys: Marsa Dooms MD  Sonographer Comments: Image acquisition challenging due to patient body habitus. IMPRESSIONS  1. Left ventricular ejection fraction, by estimation, is 40 to 45%. The left ventricle has mildly decreased function. The left ventricle has no regional wall motion abnormalities. Left ventricular diastolic parameters were normal.  2. Right ventricular systolic function is normal. The right ventricular size is normal.  3. The mitral valve is normal in structure. Mild to moderate mitral valve regurgitation. No evidence of mitral stenosis.  4. The aortic valve is normal in structure. Aortic valve regurgitation is mild. No aortic stenosis is present.  5. The inferior vena cava is normal in size with greater than 50% respiratory variability, suggesting right atrial pressure of 3 mmHg. FINDINGS  Left Ventricle: Left ventricular ejection fraction, by estimation, is 40 to 45%. The left ventricle has mildly decreased function. The left ventricle has no regional wall motion abnormalities. Strain was performed and the global longitudinal strain is indeterminate. The left ventricular internal cavity size was normal in size. There is no left ventricular hypertrophy. Left ventricular diastolic parameters were normal. Right Ventricle: The right  ventricular size is normal. No increase in right ventricular wall thickness. Right ventricular systolic function is normal. Left Atrium: Left atrial size was normal in size. Right Atrium: Right atrial size was normal in size. Pericardium: There is no evidence of pericardial effusion. Mitral Valve: The mitral valve is normal in structure. Mild to moderate mitral valve regurgitation. No evidence of mitral valve stenosis. Tricuspid Valve: The tricuspid valve is normal in structure. Tricuspid valve regurgitation  is mild . No evidence of tricuspid stenosis. Aortic Valve: The aortic valve is normal in structure. Aortic valve regurgitation is mild. No aortic stenosis is present. Aortic valve mean gradient measures 2.5 mmHg. Aortic valve peak gradient measures 4.6 mmHg. Aortic valve area, by VTI measures 1.57 cm. Pulmonic Valve: The pulmonic valve was normal in structure. Pulmonic valve regurgitation is not visualized. No evidence of pulmonic stenosis. Aorta: The aortic root is normal in size and structure. Venous: The inferior vena cava is normal in size with greater than 50% respiratory variability, suggesting right atrial pressure of 3 mmHg. IAS/Shunts: No atrial level shunt detected by color flow Doppler. Additional Comments: 3D was performed not requiring image post processing on an independent workstation and was abnormal.  LEFT VENTRICLE PLAX 2D LVIDd:         5.10 cm      Diastology LVIDs:         4.20 cm      LV e' medial:    9.57 cm/s LV PW:         1.00 cm      LV E/e' medial:  8.8 LV IVS:        1.00 cm      LV e' lateral:   14.00 cm/s LVOT diam:     2.20 cm      LV E/e' lateral: 6.0 LV SV:         32 LV SV Index:   21 LVOT Area:     3.80 cm  LV Volumes (MOD) LV vol d, MOD A4C: 122.0 ml LV vol s, MOD A4C: 73.9 ml LV SV MOD A4C:     122.0 ml RIGHT VENTRICLE RV Basal diam:  3.70 cm RV Mid diam:    4.30 cm RV S prime:     9.36 cm/s TAPSE (M-mode): 2.2 cm LEFT ATRIUM             Index        RIGHT ATRIUM            Index LA diam:        3.30 cm 2.13 cm/m   RA Area:     13.40 cm LA Vol (A2C):   80.5 ml 51.87 ml/m  RA Volume:   33.70 ml  21.72 ml/m LA Vol (A4C):   43.3 ml 27.90 ml/m LA Biplane Vol: 58.8 ml 37.89 ml/m  AORTIC VALVE AV Area (Vmax):    1.47 cm AV Area (Vmean):   1.42 cm AV Area (VTI):     1.57 cm AV Vmax:           107.50 cm/s AV Vmean:          73.050 cm/s AV VTI:            0.206 m AV Peak Grad:      4.6 mmHg AV Mean Grad:      2.5 mmHg LVOT Vmax:         41.50 cm/s LVOT Vmean:        27.300 cm/s LVOT VTI:          0.085 m LVOT/AV VTI ratio: 0.41  AORTA Ao Root diam: 2.90 cm MITRAL VALVE               TRICUSPID VALVE MV Area (PHT): 3.53 cm    TR Peak grad:   44.6 mmHg MV Decel Time: 215 msec    TR Vmax:        334.00 cm/s  MV E velocity: 84.40 cm/s MV A velocity: 56.10 cm/s  SHUNTS MV E/A ratio:  1.50        Systemic VTI:  0.08 m                            Systemic Diam: 2.20 cm Marsa Dooms MD Electronically signed by Marsa Dooms MD Signature Date/Time: 04/16/2024/12:57:44 PM    Final    US  RENAL Result Date: 04/16/2024 CLINICAL DATA:  [id number] Acute kidney failure (HCC) [id number] EXAM: RENAL / URINARY TRACT ULTRASOUND COMPLETE COMPARISON:  04/16/2024 CT of the chest, abdomen, and pelvis FINDINGS: Right Kidney: Renal measurements: 8.9 x 4.4 x 5.4 cm = volume: 110 mL.Normal echogenicity. No mass. No hydronephrosis or nephrolithiasis. Left Kidney: Renal measurements: 9.1 x 4.9 x 3.7 cm = volume: 85 mL. Normal echogenicity. No mass. No hydronephrosis or nephrolithiasis. Bladder: Appears normal for degree of bladder distention. Other: None. IMPRESSION: No hydronephrosis or nephrolithiasis. Electronically Signed   By: Rogelia Myers M.D.   On: 04/16/2024 12:26   CT CHEST ABDOMEN PELVIS WO CONTRAST Result Date: 04/16/2024 EXAM: CT CHEST, ABDOMEN AND PELVIS WITHOUT CONTRAST 04/16/2024 12:34:20 AM TECHNIQUE: CT of the chest, abdomen and pelvis was performed without the administration of  intravenous contrast. Multiplanar reformatted images are provided for review. Automated exposure control, iterative reconstruction, and/or weight based adjustment of the mA/kV was utilized to reduce the radiation dose to as low as reasonably achievable. COMPARISON: 09/08/2022 CLINICAL HISTORY: Eval for malignancy. Pt reports leg weakness for the past 2 weeks, pt reports he feels dehydrated but denies change in eating or drinking. Pt denies dysuria, cough congestion or fever. FINDINGS: CHEST: MEDIASTINUM AND LYMPH NODES: Heart and pericardium are unremarkable. The central airways are clear. No mediastinal, hilar or axillary lymphadenopathy. LUNGS AND PLEURA: No focal consolidation or pulmonary edema. No pleural effusion or pneumothorax. Emphysema. ABDOMEN AND PELVIS: LIVER: Hepatic steatosis. GALLBLADDER AND BILE DUCTS: Cholelithiasis. No evidence of acute cholecystitis. SPLEEN: No acute abnormality. PANCREAS: Pancreatic calcifications compatible with sequelae of chronic pancreatitis. ADRENAL GLANDS: No acute abnormality. KIDNEYS, URETERS AND BLADDER: No stones in the kidneys or ureters. No hydronephrosis. No perinephric or periureteral stranding. Urinary bladder is unremarkable. GI AND BOWEL: Stomach demonstrates no acute abnormality. There is no bowel obstruction. Diverticulosis without evidence of diverticulitis. Normal appendix. REPRODUCTIVE ORGANS: No acute abnormality. PERITONEUM AND RETROPERITONEUM: No ascites. No free air. VASCULATURE: Coronary artery and aortic atherosclerotic calcification. Aorta is normal in caliber. Low-density blood pool compatible with anemia. ABDOMINAL AND PELVIS LYMPH NODES: No lymphadenopathy. REPRODUCTIVE ORGANS: No acute abnormality. BONES AND SOFT TISSUES: No acute osseous abnormality. No focal soft tissue abnormality. Bilateral femoral head AVN. IMPRESSION: 1. No acute findings. 2. Emphysema. 3. Hepatic steatosis. 4. Pancreatic calcifications compatible with sequelae of chronic  pancreatitis. 5. Cholelithiasis without evidence of acute cholecystitis. 6. Diverticulosis without evidence of diverticulitis. 7. Bilateral femoral head AVN. Electronically signed by: Norman Gatlin MD 04/16/2024 12:54 AM EDT RP Workstation: [id number]     Medical Decision Making   Avraj Lindroth Gerding is a 63 y.o. male who presents with: acute renal failure, acute blood loss, multiple electrolytes, and chronic foot changes  Pt has NO evidence of PAD down to the level of ankles though ABI (TBI not done) does not screen for digital artery disease Realistically, there is no intervention possible for digital artery disease anyway With ARF, there is no role for any angiography anyway Agree that washout and debridement B feet may  reveal much of the chronic skin changes in the feet are obscuring viable tissue Available as needed.   Redell Door, MD, FACS, FSVS Covering for [facility]: [phone]  04/17/2024, 7:12 PM

## 2024-04-18 ENCOUNTER — Inpatient Hospital Stay

## 2024-04-18 DIAGNOSIS — K921 Melena: Secondary | ICD-10-CM | POA: Diagnosis not present

## 2024-04-18 LAB — CBC WITH DIFFERENTIAL/PLATELET
Abs Immature Granulocytes: 0.05 K/uL (ref 0.00–0.07)
Basophils Absolute: 0.1 K/uL (ref 0.0–0.1)
Basophils Relative: 1 %
Eosinophils Absolute: 0.1 K/uL (ref 0.0–0.5)
Eosinophils Relative: 1 %
HCT: 36.2 % — ABNORMAL LOW (ref 39.0–52.0)
Hemoglobin: 12.3 g/dL — ABNORMAL LOW (ref 13.0–17.0)
Immature Granulocytes: 1 %
Lymphocytes Relative: 15 %
Lymphs Abs: 1.6 K/uL (ref 0.7–4.0)
MCH: 28.3 pg (ref 26.0–34.0)
MCHC: 34 g/dL (ref 30.0–36.0)
MCV: 83.2 fL (ref 80.0–100.0)
Monocytes Absolute: 1 K/uL (ref 0.1–1.0)
Monocytes Relative: 9 %
Neutro Abs: 8 K/uL — ABNORMAL HIGH (ref 1.7–7.7)
Neutrophils Relative %: 73 %
Platelets: 275 K/uL (ref 150–400)
RBC: 4.35 MIL/uL (ref 4.22–5.81)
RDW: 17 % — ABNORMAL HIGH (ref 11.5–15.5)
WBC: 10.8 K/uL — ABNORMAL HIGH (ref 4.0–10.5)
nRBC: 0 % (ref 0.0–0.2)

## 2024-04-18 LAB — RENAL FUNCTION PANEL
Albumin: 3 g/dL — ABNORMAL LOW (ref 3.5–5.0)
Anion gap: 10 (ref 5–15)
BUN: 40 mg/dL — ABNORMAL HIGH (ref 8–23)
CO2: 20 mmol/L — ABNORMAL LOW (ref 22–32)
Calcium: 7.6 mg/dL — ABNORMAL LOW (ref 8.9–10.3)
Chloride: 108 mmol/L (ref 98–111)
Creatinine, Ser: 1.69 mg/dL — ABNORMAL HIGH (ref 0.61–1.24)
GFR, Estimated: 44 mL/min — ABNORMAL LOW (ref >60)
Glucose, Bld: 157 mg/dL — ABNORMAL HIGH (ref 70–99)
Phosphorus: 1 mg/dL — CL (ref 2.5–4.6)
Potassium: 3.2 mmol/L — ABNORMAL LOW (ref 3.5–5.1)
Sodium: 138 mmol/L (ref 135–145)

## 2024-04-18 LAB — BASIC METABOLIC PANEL WITH GFR
Anion gap: 10 (ref 5–15)
BUN: 41 mg/dL — ABNORMAL HIGH (ref 8–23)
CO2: 20 mmol/L — ABNORMAL LOW (ref 22–32)
Calcium: 7.5 mg/dL — ABNORMAL LOW (ref 8.9–10.3)
Chloride: 107 mmol/L (ref 98–111)
Creatinine, Ser: 1.63 mg/dL — ABNORMAL HIGH (ref 0.61–1.24)
GFR, Estimated: 46 mL/min — ABNORMAL LOW (ref >60)
Glucose, Bld: 157 mg/dL — ABNORMAL HIGH (ref 70–99)
Potassium: 3.2 mmol/L — ABNORMAL LOW (ref 3.5–5.1)
Sodium: 137 mmol/L (ref 135–145)

## 2024-04-18 LAB — HEMOGLOBIN
Hemoglobin: 12.1 g/dL — ABNORMAL LOW (ref 13.0–17.0)
Hemoglobin: 12.5 g/dL — ABNORMAL LOW (ref 13.0–17.0)
Hemoglobin: 12.6 g/dL — ABNORMAL LOW (ref 13.0–17.0)

## 2024-04-18 LAB — GLUCOSE, CAPILLARY: Glucose-Capillary: 166 mg/dL — ABNORMAL HIGH (ref 70–99)

## 2024-04-18 MED ORDER — POTASSIUM PHOSPHATES 15 MMOLE/5ML IV SOLN
30.0000 mmol | Freq: Once | INTRAVENOUS | Status: AC
  Administered 2024-04-18: 30 mmol via INTRAVENOUS
  Filled 2024-04-18: qty 10

## 2024-04-18 NOTE — Progress Notes (Signed)
 [facility]  ROUNDING NOTE   Subjective:   Patient sitting up in bed Alert and oriented Tolerating meals without nausea Room air  Creatinine 1.6  Objective:  Vital signs in last 24 hours:  Temp:  [97.4 F (36.3 C)-97.6 F (36.4 C)] 97.6 F (36.4 C) (09/14 0750) Pulse Rate:  [63-82] 66 (09/14 0750) Resp:  [16-20] 18 (09/14 0750) BP: (96-138)/(61-88) 123/86 (09/14 0750) SpO2:  [72 %-100 %] 100 % (09/14 0750) Weight:  [42.4 kg] 42.4 kg (09/14 0500)  Weight change: -3 kg Filed Weights   04/15/24 2029 04/17/24 0500 04/18/24 0500  Weight: 46.3 kg 45.4 kg 42.4 kg    Intake/Output: I/O last 3 completed shifts: In: 3212.7 [P.O.:420; I.V.:2792.7] Out: 3040 [Urine:3040]   Intake/Output this shift:  Total I/O In: -  Out: 375 [Urine:375]  Physical Exam: General: NAD  Head: Normocephalic, atraumatic. Moist oral mucosal membranes  Eyes: Anicteric  Lungs:  Clear to auscultation  Heart: Regular rate and rhythm  Abdomen:  Soft, nontender  Extremities:  No peripheral edema.  Neurologic: Awake, alert, conversant  Skin: Warm,dry, no rash       Basic Metabolic Panel: Recent Labs  Lab 04/15/24 2033 04/16/24 1258 04/17/24 0728 04/18/24 0535  NA 130* 134* 137 138  137  K 3.2* 4.6 3.7 3.2*  3.2*  CL 97* 104 107 108  107  CO2 16* 14* 17* 20*  20*  GLUCOSE 133* 110* 185* 157*  157*  BUN 79* 73* 54* 40*  41*  CREATININE 6.44* 5.63* 3.24* 1.69*  1.63*  CALCIUM 7.9* 7.3* 7.5* 7.6*  7.5*  MG 1.4*  --   --   --   PHOS  --   --  1.2* 1.0*    Liver Function Tests: Recent Labs  Lab 04/15/24 2033 04/17/24 0728 04/18/24 0535  AST 29  --   --   ALT 15  --   --   ALKPHOS 69  --   --   BILITOT 1.0  --   --   PROT 7.9  --   --   ALBUMIN 3.5 2.9* 3.0*   No results for input(s): LIPASE, AMYLASE in the last 168 hours. No results for input(s): AMMONIA in the last 168 hours.  CBC: Recent Labs  Lab 04/15/24 2033 04/16/24 1258 04/16/24 1626  04/17/24 1615 04/17/24 1935 04/17/24 2359 04/18/24 0535 04/18/24 0833  WBC 9.0 9.3  --   --   --   --  10.8*  --   NEUTROABS  --  7.0  --   --   --   --  8.0*  --   HGB 5.4* 11.6*   < > 12.7* 12.0* 12.1* 12.3* 12.6*  HCT 17.3* 35.4*  --   --   --   --  36.2*  --   MCV 79.0* 85.1  --   --   --   --  83.2  --   PLT 465* 289  --   --   --   --  275  --    < > = values in this interval not displayed.    Cardiac Enzymes: No results for input(s): CKTOTAL, CKMB, CKMBINDEX, TROPONINI in the last 168 hours.  BNP: Invalid input(s): POCBNP  CBG: Recent Labs  Lab 04/16/24 1209 04/17/24 0425 04/18/24 0605  GLUCAP 81 184* 166*    Microbiology: No results found for this or any previous visit.  Coagulation Studies: Recent Labs    04/16/24 0005  LABPROT  17.0*  INR 1.3*    Urinalysis: Recent Labs    04/16/24 1025  COLORURINE YELLOW*  LABSPEC 1.009  PHURINE 6.0  GLUCOSEU NEGATIVE  HGBUR SMALL*  BILIRUBINUR NEGATIVE  KETONESUR NEGATIVE  PROTEINUR NEGATIVE  NITRITE NEGATIVE  LEUKOCYTESUR NEGATIVE      Imaging: US  ARTERIAL ABI (SCREENING LOWER EXTREMITY) Result Date: 04/17/2024 EXAM: ULTRASOUND OF THE ARTERIAL SYSTEM OF THE BILATERAL LOWER EXTREMITIES TECHNIQUE: Duplex ultrasound using B-mode/gray scaled imaging, Doppler spectral analysis and color flow Doppler was obtained of the bilateral lower extremities. COMPARISON: None. CLINICAL HISTORY: 89987 Gangrene (HCC) 10012. Gangrene (HCC) 10012. FINDINGS: at rest, right abi 1.24, left 1.0. Multiphasic waveforms recorded in distal calf arteries bilaterally. IMPRESSION: 1. Normal ABIs. Electronically signed by: Dayne Hassell MD 04/17/2024 11:37 PM EDT RP Workstation: [id number]   DG Foot 2 Views Left Result Date: 04/17/2024 EXAM: 2 VIEW(S) XRAY OF THE LEFT FOOT 04/17/2024 11:53:34 AM COMPARISON: None available. CLINICAL HISTORY: Gangrene FINDINGS: BONES AND JOINTS: No acute fracture. Marked skin distortion about the  first, fourth and fifth toes with suspected subcutaneous emphysema limiting assessment of the phalanges. Mild DJD at the first MTP and interphalangeal joints. Diffuse osteopenia. SOFT TISSUES: Scattered vascular calcifications. IMPRESSION: 1. Marked skin distortion about the first, fourth, and fifth toes with suspected subcutaneous emphysema, limiting assessment of the phalanges. 2. Mild DJD at the first MTP and interphalangeal joints. 3. Diffuse osteopenia. 4. Scattered vascular calcifications. Electronically signed by: Dayne Hassell MD 04/17/2024 03:44 PM EDT RP Workstation: [id number]   DG Foot 2 Views Right Result Date: 04/17/2024 EXAM: 2 VIEW(S) XRAY OF THE RIGHT FOOT 04/17/2024 11:53:34 AM COMPARISON: None available. CLINICAL HISTORY: Gangrene FINDINGS: BONES AND JOINTS: Erosive changes of the distal aspect of the first digit distal phalanx with surrounding subcutaneous emphysema concerning for infection with gas-forming organism. Erosion/amputation of mid and distal phalanges fourth and fifth toes. Diffuse osteopenia. Mild midfoot degenerative changes. SOFT TISSUES: Surrounding soft tissue swelling. Vascular calcifications. IMPRESSION: 1. Erosive changes of the distal aspect of the first digit distal phalanx with surrounding subcutaneous emphysema, concerning for infection with gas-forming organism. 2. Erosion/amputation of mid and distal phalanges of the fourth and fifth toes with surrounding soft tissue swelling. 3. Diffuse osteopenia. 4. Mild midfoot degenerative changes. 5. Vascular calcifications. Electronically signed by: Katheleen Faes MD 04/17/2024 03:41 PM EDT RP Workstation: [id number]   ECHOCARDIOGRAM COMPLETE Result Date: 04/16/2024    ECHOCARDIOGRAM REPORT   Patient Name:   Bernard Harris Date of Exam: 04/16/2024 Medical Rec #:  [id number]             Height:       69.0 in Accession #:    [id number]            Weight:       102.0 lb Date of Birth:  Aug 21, 1960              BSA:           1.552 m Patient Age:    63 years              BP:           112/67 mmHg Patient Gender: M                     HR:           64 bpm. Exam Location:  [facility] Procedure: 2D Echo, Cardiac Doppler and Color Doppler (Both Spectral and Color  Flow Doppler were utilized during procedure). Indications:     Elevated troponin  History:         Patient has no prior history of Echocardiogram examinations.                  Previous Myocardial Infarction, COPD, Signs/Symptoms:Dyspnea;                  Risk Factors:Hypertension.  Sonographer:     Christopher Furnace Referring Phys:  [id number] DELAYNE LULLA SOLIAN Diagnosing Phys: Marsa Dooms MD  Sonographer Comments: Image acquisition challenging due to patient body habitus. IMPRESSIONS  1. Left ventricular ejection fraction, by estimation, is 40 to 45%. The left ventricle has mildly decreased function. The left ventricle has no regional wall motion abnormalities. Left ventricular diastolic parameters were normal.  2. Right ventricular systolic function is normal. The right ventricular size is normal.  3. The mitral valve is normal in structure. Mild to moderate mitral valve regurgitation. No evidence of mitral stenosis.  4. The aortic valve is normal in structure. Aortic valve regurgitation is mild. No aortic stenosis is present.  5. The inferior vena cava is normal in size with greater than 50% respiratory variability, suggesting right atrial pressure of 3 mmHg. FINDINGS  Left Ventricle: Left ventricular ejection fraction, by estimation, is 40 to 45%. The left ventricle has mildly decreased function. The left ventricle has no regional wall motion abnormalities. Strain was performed and the global longitudinal strain is indeterminate. The left ventricular internal cavity size was normal in size. There is no left ventricular hypertrophy. Left ventricular diastolic parameters were normal. Right Ventricle: The right ventricular size is normal. No increase in right ventricular wall  thickness. Right ventricular systolic function is normal. Left Atrium: Left atrial size was normal in size. Right Atrium: Right atrial size was normal in size. Pericardium: There is no evidence of pericardial effusion. Mitral Valve: The mitral valve is normal in structure. Mild to moderate mitral valve regurgitation. No evidence of mitral valve stenosis. Tricuspid Valve: The tricuspid valve is normal in structure. Tricuspid valve regurgitation is mild . No evidence of tricuspid stenosis. Aortic Valve: The aortic valve is normal in structure. Aortic valve regurgitation is mild. No aortic stenosis is present. Aortic valve mean gradient measures 2.5 mmHg. Aortic valve peak gradient measures 4.6 mmHg. Aortic valve area, by VTI measures 1.57 cm. Pulmonic Valve: The pulmonic valve was normal in structure. Pulmonic valve regurgitation is not visualized. No evidence of pulmonic stenosis. Aorta: The aortic root is normal in size and structure. Venous: The inferior vena cava is normal in size with greater than 50% respiratory variability, suggesting right atrial pressure of 3 mmHg. IAS/Shunts: No atrial level shunt detected by color flow Doppler. Additional Comments: 3D was performed not requiring image post processing on an independent workstation and was abnormal.  LEFT VENTRICLE PLAX 2D LVIDd:         5.10 cm      Diastology LVIDs:         4.20 cm      LV e' medial:    9.57 cm/s LV PW:         1.00 cm      LV E/e' medial:  8.8 LV IVS:        1.00 cm      LV e' lateral:   14.00 cm/s LVOT diam:     2.20 cm      LV E/e' lateral: 6.0 LV SV:  32 LV SV Index:   21 LVOT Area:     3.80 cm  LV Volumes (MOD) LV vol d, MOD A4C: 122.0 ml LV vol s, MOD A4C: 73.9 ml LV SV MOD A4C:     122.0 ml RIGHT VENTRICLE RV Basal diam:  3.70 cm RV Mid diam:    4.30 cm RV S prime:     9.36 cm/s TAPSE (M-mode): 2.2 cm LEFT ATRIUM             Index        RIGHT ATRIUM           Index LA diam:        3.30 cm 2.13 cm/m   RA Area:     13.40 cm  LA Vol (A2C):   80.5 ml 51.87 ml/m  RA Volume:   33.70 ml  21.72 ml/m LA Vol (A4C):   43.3 ml 27.90 ml/m LA Biplane Vol: 58.8 ml 37.89 ml/m  AORTIC VALVE AV Area (Vmax):    1.47 cm AV Area (Vmean):   1.42 cm AV Area (VTI):     1.57 cm AV Vmax:           107.50 cm/s AV Vmean:          73.050 cm/s AV VTI:            0.206 m AV Peak Grad:      4.6 mmHg AV Mean Grad:      2.5 mmHg LVOT Vmax:         41.50 cm/s LVOT Vmean:        27.300 cm/s LVOT VTI:          0.085 m LVOT/AV VTI ratio: 0.41  AORTA Ao Root diam: 2.90 cm MITRAL VALVE               TRICUSPID VALVE MV Area (PHT): 3.53 cm    TR Peak grad:   44.6 mmHg MV Decel Time: 215 msec    TR Vmax:        334.00 cm/s MV E velocity: 84.40 cm/s MV A velocity: 56.10 cm/s  SHUNTS MV E/A ratio:  1.50        Systemic VTI:  0.08 m                            Systemic Diam: 2.20 cm Marsa Dooms MD Electronically signed by Marsa Dooms MD Signature Date/Time: 04/16/2024/12:57:44 PM    Final    US  RENAL Result Date: 04/16/2024 CLINICAL DATA:  [id number] Acute kidney failure (HCC) 256241 EXAM: RENAL / URINARY TRACT ULTRASOUND COMPLETE COMPARISON:  04/16/2024 CT of the chest, abdomen, and pelvis FINDINGS: Right Kidney: Renal measurements: 8.9 x 4.4 x 5.4 cm = volume: 110 mL.Normal echogenicity. No mass. No hydronephrosis or nephrolithiasis. Left Kidney: Renal measurements: 9.1 x 4.9 x 3.7 cm = volume: 85 mL. Normal echogenicity. No mass. No hydronephrosis or nephrolithiasis. Bladder: Appears normal for degree of bladder distention. Other: None. IMPRESSION: No hydronephrosis or nephrolithiasis. Electronically Signed   By: Rogelia Myers M.D.   On: 04/16/2024 12:26     Medications:    sodium chloride  50 mL/hr at 04/18/24 0700   potassium PHOSPHATE  IVPB (in mmol) 30 mmol (04/18/24 0915)    blood pressure control book   Does not apply Once   folic acid   1 mg Oral Daily   multivitamin with minerals  1 tablet Oral Daily   nicotine   21 mg Transdermal Daily    pantoprazole  (PROTONIX ) IV  40 mg Intravenous Q12H   sodium chloride  flush  3 mL Intravenous Q12H   thiamine   100 mg Oral Daily   Or   thiamine   100 mg Intravenous Daily   acetaminophen  **OR** acetaminophen , LORazepam  **OR** LORazepam , ondansetron  **OR** ondansetron  (ZOFRAN ) IV  Assessment/ Plan:  Mr. Bernard Harris is a 63 y.o.  male  with past medical conditions including tobacco and nicotine  abuse, CAD, hypertension, COPD, who was admitted to [facility] on 04/15/2024 for Hypokalemia [E87.6] Hypomagnesemia [E83.42] Acute blood loss anemia [D62] Cachectic (HCC) [R64] Diverticulosis [K57.90] Gallstones [K80.20] Upper GI bleed [K92.2] Elevated troponin [R79.89] Symptomatic anemia [D64.9] Acute renal failure, unspecified acute renal failure type (HCC) [N17.9] Alcohol use disorder [F10.90]   Acute kidney injury secondary to excessive NSAID use. Normal function noted in April of this year. Creatinine 6.4 on ED arrival. Patient reports 1400 mg of ibuprofen twice daily for at least 3 weeks. CT abdomen chest pelvis negative for obstruction. Renal US  unremarkable.  Creatinine continues to improve. Can continue IVF for 1 day and stop in am. Will need to follow up in our office at discharge to ensure renal recovery.   Lab Results  Component Value Date   CREATININE 1.69 (H) 04/18/2024   CREATININE 1.63 (H) 04/18/2024   CREATININE 3.24 (H) 04/17/2024    Intake/Output Summary (Last 24 hours) at 04/18/2024 1138 Last data filed at 04/18/2024 0750 Gross per 24 hour  Intake 1166.39 ml  Output 1475 ml  Net -308.61 ml   2.  Hypokalemia/hyponatremia/hypomagnesia likely secondary to kidney injury. Electrolytes have corrected with supplementation and kidney injury. Potassium remains slightly decreased.    3. Anemia due to acute blood loss, recent NSAID use.  Hemoglobin 5.4 on admission.  GI performed EGD today, no intervention required. Hgb stable, 12.6   4.  Chronic systolic heart failure,  echo completed today shows EF 40 to 45% with a mild to moderate MVR and mild AVR.    LOS: 2 Duval Macleod 9/14/202511:38 AM

## 2024-04-18 NOTE — Discharge Instructions (Signed)

## 2024-04-18 NOTE — TOC Initial Note (Signed)
 Transition of Care [facility]) - Initial/Assessment Note    Patient Details  Name: Bernard Harris MRN: [id number] Date of Birth: 09-23-1960  [facility]) CM/SW Contact:    [place] L Janes [place]grove, LCSW Phone Number: 04/18/2024, 9:49 AM  Clinical Narrative:                  [facility] consult received for substance abuse counseling and education. Resources were added to the AVS for the patient.   Please advise of any further TOC needs.        Patient Goals and CMS Choice            Expected Discharge Plan and Services                                              Prior Living Arrangements/Services                       Activities of Daily Living   ADL Screening (condition at time of admission) Independently performs ADLs?: Yes (appropriate for developmental age) Is the patient deaf or have difficulty hearing?: No Does the patient have difficulty seeing, even when wearing glasses/contacts?: No Does the patient have difficulty concentrating, remembering, or making decisions?: No  Permission Sought/Granted                  Emotional Assessment              Admission diagnosis:  Hypokalemia [E87.6] Hypomagnesemia [E83.42] Acute blood loss anemia [D62] Cachectic (HCC) [R64] Diverticulosis [K57.90] Gallstones [K80.20] Upper GI bleed [K92.2] Elevated troponin [R79.89] Symptomatic anemia [D64.9] Acute renal failure, unspecified acute renal failure type (HCC) [N17.9] Alcohol use disorder [F10.90] Patient Active Problem List   Diagnosis Date Noted   Neglect of personal hygiene 04/17/2024   Gangrene of right foot (HCC) 04/17/2024   Acute blood loss anemia 04/16/2024   Melena 04/16/2024   CAD S/P percutaneous coronary angioplasty 04/16/2024   AKI (acute kidney injury) (HCC) 04/16/2024   Cachexia (HCC) 04/16/2024   NSAID induced gastritis 04/16/2024   Electrolyte abnormality 04/16/2024   Elevated troponin 04/16/2024   Hyperlipidemia  09/30/2017   Cyst of skin 08/20/2017   Alcohol use disorder 10/20/2013   HTN (hypertension) 10/20/2013   Tobacco use disorder 10/20/2013   Status post placement of bare metal coronary artery stent 10/16/2009   Heart attack (HCC) 10/15/2009   PCP:  Glover Lenis, MD Pharmacy:   [facility] [address] ([place]), [place] - [address] [place] ([place]) [place] [address] Phone: [phone] Fax: [phone]  [id number] - [place], [place] - [address] [place] [address] Phone: [phone] Fax: [phone]     Social Drivers of Health (SDOH) Social History: SDOH Screenings   Food Insecurity: Food Insecurity Present (04/17/2024)  Housing: Low Risk  (04/17/2024)  Transportation Needs: No Transportation Needs (04/17/2024)  Utilities: Not At Risk (04/17/2024)  Financial Resource Strain: Patient Declined (11/04/2023)   Received from [facility]  Social Connections: Moderately Integrated (04/17/2024)  Tobacco Use: High Risk (04/17/2024)   SDOH Interventions:     Readmission Risk Interventions     No data to display

## 2024-04-18 NOTE — Progress Notes (Signed)
 Progress Note   Patient: Bernard Harris FMW:[id number] DOB: 1960/08/07 DOA: 04/15/2024     2 DOS: the patient was seen and examined on 04/18/2024      Brief hospital course: From HPI Bernard Harris is a 63 y.o. male with medical history significant for COPD, tobacco use disorder, COPD, HTN, CAD with history of stent 2011, being admitted with melena and acute blood loss anemia(Hb 5.4) as well as severe AKI(creatinine 6.44) believe NSAID related.  Patient presented with 2 weeks of fatigue and worsening dyspnea on exertion.  He has noted black stool for the past week.  Denies abdominal pain,. emesis.  Denies chest pain. Reports he has been having knee pain from a fall 3 weeks ago and has been using ibuprofen up to 7 tablets twice a day based on what a friend told him his doctor prescribed for similar problem. Endorses drinking anywhere from 1-5 drinks of vodka daily but has not done so recently due to the weakness(states half gallon of vodka lasts 2 to 3 weeks) In the ED, vitals initially within normal limits however last night progressed BP started trending down to as low as 78/52, improving somewhat with fluids. Labs notable for hemoglobin of 5.4, with most recent of 7.8 back in April 2025. Creatinine noted to be 6.44 most recently 0.7 in April  Bicarb of 16, anion gap of 17, normal LFTs Sodium 130 and potassium 3.2, magnesium  1.4 EtOH less than 15 Troponin 277--255 UDS pending EKG showing NSR at 66 with nonspecific ST-T wave changes CT chest abdomen pelvis without with no acute findings.  Does show hepatic steatosis, cholelithiasis without cholecystitis, pancreatic calcifications compatible with chronic pancreatitis.  Patient typed and crossed and started on 1 of 3 units PRBCs, given an NS bolus, IV mag repletion, IV Protonix  80 mg. Admission requested        Assessment and Plan:  Melena NSAID induced gastropathy/alcoholic gastritis Acute on likely chronic blood loss anemia   Hypotension secondary to hypovolemia/acute blood loss hemoglobin of 5.4, with most recent of 7.8 back in April 2025. Continue transfusion of 3 units PRBCs Continue IV Protonix  and octreotide  Patient has been seen by GI with plans to have EGD when clinically stable   AKI (acute kidney injury) (HCC) Anion gap metabolic acidosis Suspect NSAID induced nephropathy Suspect ATN/(?interstitial nephritis) related to NSAIDs in combination with hypovolemia/hypotension from blood loss CT chest abdomen and pelvis with no concerning findings and kidneys/urinary system Received a 2 L fluid bolus in the ED Receiving blood transfusions Monitor renal function and avoid nephrotoxins I discussed the plan of care with nephrology   Electrolyte abnormality Hypokalemia, hypomagnesemia and hyponatremia Likely related to hypovolemia, hypotension, alcohol use Pharmacy consulted for assistance with electrolyte management   CAD S/P percutaneous coronary angioplasty Elevated troponin Troponin elevated in the 200s but downtrending, likely supply/demand mismatch Will hold aspirin due to bleeding Hold metoprolol, lisinopril, nitro due to hypotension Follow-up on echo   Cachexia (HCC) Suspect severe protein calorie malnutrition related to alcohol use disorder BMI 15     Tobacco use disorder Nicotine  patch   Alcohol use disorder Given thiamine  folate and multivitamin in the ED CIWA withdrawal protocol   Bilateral foot gangrene I discussed the case with podiatry Vascular surgery consulted   DVT prophylaxis: SCD   Consults: GI, nephrology   Advance Care Planning:   Code Status: Full Code    Family Communication: nephew at bedside   Disposition Plan: Back to previous home environment  Subjective:  Patient feels much better today than upon presentation Renal function improving Podiatry planning possible surgical intervention this week He denies nausea vomiting chest pain   Physical  Exam: Vitals and nursing note reviewed.  Constitutional:      General: He is awake. He is not in acute distress.    Appearance: He is cachectic.  HENT:     Head: Normocephalic and atraumatic.  Cardiovascular:     Rate and Rhythm: Normal rate and regular rhythm.     Heart sounds: Normal heart sounds.  Pulmonary:     Effort: Pulmonary effort is normal.     Breath sounds: Normal breath sounds.  Abdominal:     Palpations: Abdomen is soft.     Tenderness: There is no abdominal tenderness.  Neurological:     Mental Status: He is alert. Mental status is at baseline.     Data Reviewed:   Vitals:   04/18/24 0500 04/18/24 0750 04/18/24 1146 04/18/24 1516  BP:  123/86 127/83 126/80  Pulse:  66 69 70  Resp:  18 17 16   Temp:  97.6 F (36.4 C) (!) 97.5 F (36.4 C) (!) 97.5 F (36.4 C)  TempSrc:      SpO2:  100% 100% 100%  Weight: 42.4 kg     Height:          Latest Ref Rng & Units 04/18/2024    5:35 AM 04/17/2024    7:28 AM 04/16/2024   12:58 PM  BMP  Glucose 70 - 99 mg/dL 70 - 99 mg/dL 842    842  814  889   BUN 8 - 23 mg/dL 8 - 23 mg/dL 40    41  54  73   Creatinine 0.61 - 1.24 mg/dL 9.38 - 8.75 mg/dL 8.30    8.36  6.75  4.36   Sodium 135 - 145 mmol/L 135 - 145 mmol/L 138    137  137  134   Potassium 3.5 - 5.1 mmol/L 3.5 - 5.1 mmol/L 3.2    3.2  3.7  4.6   Chloride 98 - 111 mmol/L 98 - 111 mmol/L 108    107  107  104   CO2 22 - 32 mmol/L 22 - 32 mmol/L 20    20  17  14    Calcium 8.9 - 10.3 mg/dL 8.9 - 89.6 mg/dL 7.6    7.5  7.5  7.3      Author: Drue ONEIDA Potter, MD 04/18/2024 5:27 PM  For on call review [email or web address].

## 2024-04-18 NOTE — Plan of Care (Signed)
  Problem: Education: Goal: Knowledge of condition and prescribed therapy will improve Outcome: Progressing   Problem: Clinical Measurements: Goal: Ability to maintain clinical measurements within normal limits will improve Outcome: Progressing Goal: Will remain free from infection Outcome: Progressing Goal: Diagnostic test results will improve Outcome: Progressing   Problem: Activity: Goal: Risk for activity intolerance will decrease Outcome: Progressing   Problem: Nutrition: Goal: Adequate nutrition will be maintained Outcome: Progressing   Problem: Coping: Goal: Level of anxiety will decrease Outcome: Progressing   Problem: Elimination: Goal: Will not experience complications related to bowel motility Outcome: Progressing   Problem: Safety: Goal: Ability to remain free from injury will improve Outcome: Progressing

## 2024-04-19 ENCOUNTER — Inpatient Hospital Stay

## 2024-04-19 ENCOUNTER — Inpatient Hospital Stay: Admitting: Anesthesiology

## 2024-04-19 ENCOUNTER — Encounter: Admission: EM | Disposition: A | Discharge status: Skilled Nursing Facility | Payer: Self-pay | Source: Home / Self Care

## 2024-04-19 ENCOUNTER — Other Ambulatory Visit: Payer: Self-pay

## 2024-04-19 ENCOUNTER — Encounter: Payer: Self-pay | Admitting: Gastroenterology

## 2024-04-19 DIAGNOSIS — M86372 Chronic multifocal osteomyelitis, left ankle and foot: Secondary | ICD-10-CM

## 2024-04-19 DIAGNOSIS — D62 Acute posthemorrhagic anemia: Secondary | ICD-10-CM | POA: Diagnosis not present

## 2024-04-19 DIAGNOSIS — K921 Melena: Secondary | ICD-10-CM | POA: Diagnosis not present

## 2024-04-19 DIAGNOSIS — T182XXA Foreign body in stomach, initial encounter: Secondary | ICD-10-CM | POA: Diagnosis not present

## 2024-04-19 DIAGNOSIS — M86371 Chronic multifocal osteomyelitis, right ankle and foot: Secondary | ICD-10-CM

## 2024-04-19 HISTORY — PX: IRRIGATION AND DEBRIDEMENT FOOT: SHX6602

## 2024-04-19 HISTORY — PX: ESOPHAGOGASTRODUODENOSCOPY: SHX5428

## 2024-04-19 LAB — GLUCOSE, CAPILLARY: Glucose-Capillary: 148 mg/dL — ABNORMAL HIGH (ref 70–99)

## 2024-04-19 LAB — CBC WITH DIFFERENTIAL/PLATELET
Abs Immature Granulocytes: 0.05 K/uL (ref 0.00–0.07)
Basophils Absolute: 0.1 K/uL (ref 0.0–0.1)
Basophils Relative: 1 %
Eosinophils Absolute: 0.1 K/uL (ref 0.0–0.5)
Eosinophils Relative: 1 %
HCT: 38.1 % — ABNORMAL LOW (ref 39.0–52.0)
Hemoglobin: 12.3 g/dL — ABNORMAL LOW (ref 13.0–17.0)
Immature Granulocytes: 1 %
Lymphocytes Relative: 16 %
Lymphs Abs: 1.8 K/uL (ref 0.7–4.0)
MCH: 28 pg (ref 26.0–34.0)
MCHC: 32.3 g/dL (ref 30.0–36.0)
MCV: 86.8 fL (ref 80.0–100.0)
Monocytes Absolute: 0.9 K/uL (ref 0.1–1.0)
Monocytes Relative: 8 %
Neutro Abs: 8.1 K/uL — ABNORMAL HIGH (ref 1.7–7.7)
Neutrophils Relative %: 73 %
Platelets: 250 K/uL (ref 150–400)
RBC: 4.39 MIL/uL (ref 4.22–5.81)
RDW: 17.8 % — ABNORMAL HIGH (ref 11.5–15.5)
WBC: 11.1 K/uL — ABNORMAL HIGH (ref 4.0–10.5)
nRBC: 0 % (ref 0.0–0.2)

## 2024-04-19 LAB — BASIC METABOLIC PANEL WITH GFR
Anion gap: 8 (ref 5–15)
BUN: 25 mg/dL — ABNORMAL HIGH (ref 8–23)
CO2: 21 mmol/L — ABNORMAL LOW (ref 22–32)
Calcium: 6.7 mg/dL — ABNORMAL LOW (ref 8.9–10.3)
Chloride: 108 mmol/L (ref 98–111)
Creatinine, Ser: 1.41 mg/dL — ABNORMAL HIGH (ref 0.61–1.24)
GFR, Estimated: 55 mL/min — ABNORMAL LOW (ref >60)
Glucose, Bld: 187 mg/dL — ABNORMAL HIGH (ref 70–99)
Potassium: 4.8 mmol/L (ref 3.5–5.1)
Sodium: 137 mmol/L (ref 135–145)

## 2024-04-19 SURGERY — EGD (ESOPHAGOGASTRODUODENOSCOPY)
Anesthesia: General

## 2024-04-19 SURGERY — IRRIGATION AND DEBRIDEMENT FOOT
Anesthesia: General | Laterality: Bilateral

## 2024-04-19 MED ORDER — MORPHINE SULFATE (PF) 2 MG/ML IV SOLN
2.0000 mg | INTRAVENOUS | Status: DC | PRN
  Administered 2024-04-19 – 2024-04-20 (×2): 2 mg via INTRAVENOUS
  Filled 2024-04-19 (×2): qty 1

## 2024-04-19 MED ORDER — PHENYLEPHRINE HCL (PRESSORS) 10 MG/ML IV SOLN
INTRAVENOUS | Status: DC | PRN
Start: 2024-04-19 — End: 2024-04-19
  Administered 2024-04-19: 240 ug via INTRAVENOUS
  Administered 2024-04-19 (×2): 160 ug via INTRAVENOUS

## 2024-04-19 MED ORDER — MIDAZOLAM HCL 2 MG/2ML IJ SOLN
INTRAMUSCULAR | Status: DC | PRN
  Administered 2024-04-19: 2 mg via INTRAVENOUS

## 2024-04-19 MED ORDER — LIDOCAINE HCL (PF) 2 % IJ SOLN
INTRAMUSCULAR | Status: AC
  Filled 2024-04-19: qty 5

## 2024-04-19 MED ORDER — KETAMINE HCL 10 MG/ML IJ SOLN
INTRAMUSCULAR | Status: DC | PRN
Start: 2024-04-19 — End: 2024-04-19
  Administered 2024-04-19 (×3): 10 mg via INTRAVENOUS
  Administered 2024-04-19: 20 mg via INTRAVENOUS

## 2024-04-19 MED ORDER — 0.9 % SODIUM CHLORIDE (POUR BTL) OPTIME
TOPICAL | Status: DC | PRN
  Administered 2024-04-19: 500 mL

## 2024-04-19 MED ORDER — CEFAZOLIN SODIUM-DEXTROSE 2-4 GM/100ML-% IV SOLN
INTRAVENOUS | Status: AC
  Filled 2024-04-19: qty 100

## 2024-04-19 MED ORDER — LIDOCAINE HCL (CARDIAC) PF 100 MG/5ML IV SOSY
PREFILLED_SYRINGE | INTRAVENOUS | Status: DC | PRN
  Administered 2024-04-19: 100 mg via INTRAVENOUS

## 2024-04-19 MED ORDER — ONDANSETRON HCL 4 MG/2ML IJ SOLN
INTRAMUSCULAR | Status: DC | PRN
Start: 2024-04-19 — End: 2024-04-19
  Administered 2024-04-19: 4 mg via INTRAVENOUS

## 2024-04-19 MED ORDER — PROPOFOL 10 MG/ML IV BOLUS
INTRAVENOUS | Status: DC | PRN
  Administered 2024-04-19: 80 mg via INTRAVENOUS

## 2024-04-19 MED ORDER — PROPOFOL 500 MG/50ML IV EMUL
INTRAVENOUS | Status: DC | PRN
  Administered 2024-04-19: 145 ug/kg/min via INTRAVENOUS

## 2024-04-19 MED ORDER — MIDAZOLAM HCL 2 MG/2ML IJ SOLN
INTRAMUSCULAR | Status: AC
  Filled 2024-04-19: qty 2

## 2024-04-19 MED ORDER — LACTATED RINGERS IV SOLN: INTRAVENOUS | Status: DC

## 2024-04-19 MED ORDER — DEXMEDETOMIDINE HCL IN NACL 200 MCG/50ML IV SOLN
INTRAVENOUS | Status: DC | PRN
Start: 2024-04-19 — End: 2024-04-19
  Administered 2024-04-19: 12 ug via INTRAVENOUS

## 2024-04-19 MED ORDER — SODIUM CHLORIDE 0.9 % IV SOLN: INTRAVENOUS | Status: DC

## 2024-04-19 MED ORDER — OXYCODONE HCL 5 MG PO TABS: 5.0000 mg | ORAL_TABLET | Freq: Once | ORAL | Status: DC | PRN

## 2024-04-19 MED ORDER — GLYCOPYRROLATE 0.2 MG/ML IJ SOLN
INTRAMUSCULAR | Status: DC | PRN
Start: 2024-04-19 — End: 2024-04-19
  Administered 2024-04-19: .2 mg via INTRAVENOUS

## 2024-04-19 MED ORDER — BUPIVACAINE HCL (PF) 0.5 % IJ SOLN
INTRAMUSCULAR | Status: AC
Start: 2024-04-19 — End: 2024-04-19
  Filled 2024-04-19: qty 30

## 2024-04-19 MED ORDER — OXYCODONE HCL 5 MG/5ML PO SOLN: 5.0000 mg | Freq: Once | ORAL | Status: DC | PRN

## 2024-04-19 MED ORDER — KETAMINE HCL 50 MG/5ML IJ SOSY
PREFILLED_SYRINGE | INTRAMUSCULAR | Status: AC
  Filled 2024-04-19: qty 5

## 2024-04-19 MED ORDER — PHENYLEPHRINE 80 MCG/ML (10ML) SYRINGE FOR IV PUSH (FOR BLOOD PRESSURE SUPPORT)
PREFILLED_SYRINGE | INTRAVENOUS | Status: AC
  Filled 2024-04-19: qty 10

## 2024-04-19 MED ORDER — EPHEDRINE SULFATE (PRESSORS) 50 MG/ML IJ SOLN
INTRAMUSCULAR | Status: DC | PRN
Start: 2024-04-19 — End: 2024-04-19
  Administered 2024-04-19: 10 mg via INTRAVENOUS

## 2024-04-19 MED ORDER — ORAL CARE MOUTH RINSE: 15.0000 mL | OROMUCOSAL | Status: DC | PRN

## 2024-04-19 MED ORDER — CEFAZOLIN SODIUM-DEXTROSE 2-3 GM-%(50ML) IV SOLR
INTRAVENOUS | Status: DC | PRN
Start: 2024-04-19 — End: 2024-04-19
  Administered 2024-04-19: 2 g via INTRAVENOUS

## 2024-04-19 MED ORDER — LIDOCAINE HCL (PF) 1 % IJ SOLN
INTRAMUSCULAR | Status: DC | PRN
  Administered 2024-04-19 (×2): 10 mL

## 2024-04-19 MED ORDER — PHENYLEPHRINE 80 MCG/ML (10ML) SYRINGE FOR IV PUSH (FOR BLOOD PRESSURE SUPPORT)
PREFILLED_SYRINGE | INTRAVENOUS | Status: DC | PRN
  Administered 2024-04-19: 160 ug via INTRAVENOUS

## 2024-04-19 MED ORDER — FENTANYL CITRATE (PF) 100 MCG/2ML IJ SOLN: 25.0000 ug | INTRAMUSCULAR | Status: DC | PRN

## 2024-04-19 MED ORDER — LIDOCAINE HCL (PF) 1 % IJ SOLN
INTRAMUSCULAR | Status: AC
  Filled 2024-04-19: qty 30

## 2024-04-19 SURGICAL SUPPLY — 54 items
BLADE MED AGGRESSIVE (BLADE) IMPLANT
BLADE SURG 15 STRL LF DISP TIS (BLADE) IMPLANT
BLADE SURG SZ10 CARB STEEL (BLADE) IMPLANT
BNDG COHESIVE 4X5 TAN STRL LF (GAUZE/BANDAGES/DRESSINGS) ×1 IMPLANT
BNDG ELASTIC 4INX 5YD STR LF (GAUZE/BANDAGES/DRESSINGS) ×1 IMPLANT
BNDG ESMARCH 4X12 STRL LF (GAUZE/BANDAGES/DRESSINGS) ×1 IMPLANT
BNDG GAUZE DERMACEA FLUFF 4 (GAUZE/BANDAGES/DRESSINGS) ×1 IMPLANT
BNDG STRETCH GAUZE 3IN X12FT (GAUZE/BANDAGES/DRESSINGS) ×1 IMPLANT
BRUSH SCRUB EZ PLAIN DRY (MISCELLANEOUS) IMPLANT
CNTNR URN SCR LID CUP LEK RST (MISCELLANEOUS) IMPLANT
CUFF TOURN SGL QUICK 18X4 (TOURNIQUET CUFF) IMPLANT
CUFF TRNQT CYL 24X4X16.5-23 (TOURNIQUET CUFF) IMPLANT
DRSG EMULSION OIL 3X8 NADH (GAUZE/BANDAGES/DRESSINGS) ×1 IMPLANT
DURAPREP 26ML APPLICATOR (WOUND CARE) ×1 IMPLANT
ELECTRODE REM PT RTRN 9FT ADLT (ELECTROSURGICAL) ×1 IMPLANT
GAUZE PACKING 0.25INX5YD STRL (GAUZE/BANDAGES/DRESSINGS) IMPLANT
GAUZE SPONGE 4X4 12PLY STRL (GAUZE/BANDAGES/DRESSINGS) ×1 IMPLANT
GAUZE STRETCH 2X75IN STRL (MISCELLANEOUS) ×1 IMPLANT
GAUZE XEROFORM 1X8 LF (GAUZE/BANDAGES/DRESSINGS) IMPLANT
GLOVE BIOGEL PI IND STRL 7.5 (GLOVE) ×1 IMPLANT
GLOVE SURG SYN 7.5 PF PI (GLOVE) ×1 IMPLANT
GOWN STRL REUS W/ TWL XL LVL3 (GOWN DISPOSABLE) ×1 IMPLANT
GOWN STRL REUS W/TWL MED LVL3 (GOWN DISPOSABLE) ×1 IMPLANT
HANDPIECE VERSAJET DEBRIDEMENT (MISCELLANEOUS) IMPLANT
IV NS 1000ML BAXH (IV SOLUTION) ×1 IMPLANT
KIT TURNOVER KIT A (KITS) ×1 IMPLANT
LABEL OR SOLS (LABEL) ×1 IMPLANT
MANIFOLD NEPTUNE II (INSTRUMENTS) ×1 IMPLANT
NDL BIOPSY JAMSHIDI 11X6 (NEEDLE) IMPLANT
NDL FILTER BLUNT 18X1 1/2 (NEEDLE) ×1 IMPLANT
NDL HYPO 25X1 1.5 SAFETY (NEEDLE) ×1 IMPLANT
NEEDLE BIOPSY JAMSHIDI 11X6 (NEEDLE) IMPLANT
NEEDLE FILTER BLUNT 18X1 1/2 (NEEDLE) ×1 IMPLANT
NEEDLE HYPO 25X1 1.5 SAFETY (NEEDLE) ×1 IMPLANT
NS IRRIG 500ML POUR BTL (IV SOLUTION) ×1 IMPLANT
PACK EXTREMITY ARMC (MISCELLANEOUS) ×1 IMPLANT
PACKING GAUZE IODOFORM 1INX5YD (GAUZE/BANDAGES/DRESSINGS) IMPLANT
PAD ABD DERMACEA PRESS 5X9 (GAUZE/BANDAGES/DRESSINGS) ×1 IMPLANT
PAD PREP OB/GYN DISP 24X41 (PERSONAL CARE ITEMS) ×1 IMPLANT
PENCIL SMOKE EVACUATOR (MISCELLANEOUS) ×1 IMPLANT
SOL .9 NS 3000ML IRR UROMATIC (IV SOLUTION) IMPLANT
SOLUTION PREP PVP 2OZ (MISCELLANEOUS) ×1 IMPLANT
SPONGE T-LAP 18X18 ~~LOC~~+RFID (SPONGE) IMPLANT
STAPLER SKIN PROX 35W (STAPLE) ×1 IMPLANT
STOCKINETTE 48X4 2 PLY STRL (GAUZE/BANDAGES/DRESSINGS) ×1 IMPLANT
STOCKINETTE IMPERVIOUS 9X36 MD (GAUZE/BANDAGES/DRESSINGS) ×1 IMPLANT
STOCKINETTE STRL 4IN 9604848 (GAUZE/BANDAGES/DRESSINGS) IMPLANT
SUT PROLENE 3 0 PS 2 (SUTURE) IMPLANT
SUTURE ETHLN 4-0 FS2 18XMF BLK (SUTURE) IMPLANT
SWAB CULTURE AMIES ANAERIB BLU (MISCELLANEOUS) IMPLANT
SYR 10ML LL (SYRINGE) ×2 IMPLANT
TIP FAN IRRIG PULSAVAC PLUS (DISPOSABLE) IMPLANT
TRAP FLUID SMOKE EVACUATOR (MISCELLANEOUS) ×1 IMPLANT
WATER STERILE IRR 500ML POUR (IV SOLUTION) ×1 IMPLANT

## 2024-04-19 NOTE — Progress Notes (Signed)
 Progress Note   Patient: Bernard Harris FMW:[id number] DOB: [birth year] DOA: 04/15/2024     3 DOS: the patient was seen and examined on 04/19/2024      Brief hospital course: From HPI Bernard Harris is a 63 y.o. male with medical history significant for COPD, tobacco use disorder, COPD, HTN, CAD with history of stent 2011, being admitted with melena and acute blood loss anemia(Hb 5.4) as well as severe AKI(creatinine 6.44) believe NSAID related.  Patient presented with 2 weeks of fatigue and worsening dyspnea on exertion.  He has noted black stool for the past week.  Denies abdominal pain,. emesis.  Denies chest pain. Reports he has been having knee pain from a fall 3 weeks ago and has been using ibuprofen up to 7 tablets twice a day based on what a friend told him his doctor prescribed for similar problem. Endorses drinking anywhere from 1-5 drinks of vodka daily but has not done so recently due to the weakness(states half gallon of vodka lasts 2 to 3 weeks) In the ED, vitals initially within normal limits however last night progressed BP started trending down to as low as 78/52, improving somewhat with fluids. Labs notable for hemoglobin of 5.4, with most recent of 7.8 back in April 2025. Creatinine noted to be 6.44 most recently 0.7 in April  Bicarb of 16, anion gap of 17, normal LFTs Sodium 130 and potassium 3.2, magnesium  1.4 EtOH less than 15 Troponin 277--255 UDS pending EKG showing NSR at 66 with nonspecific ST-T wave changes CT chest abdomen pelvis without with no acute findings.  Does show hepatic steatosis, cholelithiasis without cholecystitis, pancreatic calcifications compatible with chronic pancreatitis.  Patient typed and crossed and started on 1 of 3 units PRBCs, given an NS bolus, IV mag repletion, IV Protonix  80 mg. Admission requested      Assessment and Plan: Melena NSAID induced gastropathy/alcoholic gastritis Acute on likely chronic blood loss anemia   Hypotension secondary to hypovolemia/acute blood loss hemoglobin of 5.4, with most recent of 7.8 back in April 2025. Status post 3 units of blood transfusion Continue IV Protonix   Octreotide  discontinued Gastroenterologist planning outpatient follow-up with repeat EGD as a large amount of food was found in the gut   AKI (acute kidney injury) (HCC) Anion gap metabolic acidosis Suspect NSAID induced nephropathy Suspect ATN/(?interstitial nephritis) related to NSAIDs in combination with hypovolemia/hypotension from blood loss CT chest abdomen and pelvis with no concerning findings and kidneys/urinary system Received a 2 L fluid bolus in the ED Receiving blood transfusions Monitor renal function and avoid nephrotoxins I discussed the plan of care with nephrology   Electrolyte abnormality Hypokalemia, hypomagnesemia and hyponatremia Likely related to hypovolemia, hypotension, alcohol use Pharmacy consulted for assistance with electrolyte management   CAD S/P percutaneous coronary angioplasty Elevated troponin Troponin elevated in the 200s but downtrending, likely supply/demand mismatch Will hold aspirin due to bleeding Hold metoprolol, lisinopril, nitro due to hypotension Echocardiogram showing EF 40 to 45% with normal diastolic parameter I discussed with Dr. Darron and patient follows up with Dr. Nolen team and they recommend outpatient follow-up   Cachexia [facility]) Suspect severe protein calorie malnutrition related to alcohol use disorder BMI 15 Dietitian consulted     Tobacco use disorder Nicotine  patch   Alcohol use disorder Given thiamine  folate and multivitamin in the ED CIWA withdrawal protocol   Bilateral foot gangrene I discussed the case with podiatry Patient underwent transmetatarsal amputation of bilateral foot by podiatrist on 04/19/2024 Podiatrist recommended nonweightbearing  TOC consulted as well as PT OT as patient will need rehab placement at  discharge    DVT prophylaxis: SCD   Consults: GI, nephrology   Advance Care Planning:   Code Status: Full Code    Family Communication: nephew at bedside   Disposition Plan: Back to previous home environment     Subjective:  Patient feels much better today than upon presentation Patient taken for bilateral transmetatarsal amputation by podiatry today   Physical Exam: Vitals and nursing note reviewed.  Constitutional:      General: He is awake. He is not in acute distress.    Appearance: He is cachectic.  HENT:     Head: Normocephalic and atraumatic.  Cardiovascular:     Rate and Rhythm: Normal rate and regular rhythm.     Heart sounds: Normal heart sounds.  Pulmonary:     Effort: Pulmonary effort is normal.     Breath sounds: Normal breath sounds.  Abdominal:     Palpations: Abdomen is soft.     Tenderness: There is no abdominal tenderness.  Neurological:     Mental Status: He is alert. Mental status is at baseline.  Musculoskeletal: Dressing noted to bilateral foot    Data Reviewed:        Latest Ref Rng & Units 04/19/2024    5:18 AM 04/18/2024   11:52 AM 04/18/2024    8:33 AM  CBC  WBC 4.0 - 10.5 K/uL 11.1     Hemoglobin 13.0 - 17.0 g/dL 87.6  87.4  87.3   Hematocrit 39.0 - 52.0 % 38.1     Platelets 150 - 400 K/uL 250          Latest Ref Rng & Units 04/19/2024    5:18 AM 04/18/2024    5:35 AM 04/17/2024    7:28 AM  BMP  Glucose 70 - 99 mg/dL 812  842    842  814   BUN 8 - 23 mg/dL 25  40    41  54   Creatinine 0.61 - 1.24 mg/dL 8.58  8.30    8.36  6.75   Sodium 135 - 145 mmol/L 137  138    137  137   Potassium 3.5 - 5.1 mmol/L 4.8  3.2    3.2  3.7   Chloride 98 - 111 mmol/L 108  108    107  107   CO2 22 - 32 mmol/L 21  20    20  17    Calcium  8.9 - 10.3 mg/dL 6.7  7.6    7.5  7.5      Vitals:   04/19/24 1206 04/19/24 1225 04/19/24 1331 04/19/24 1546  BP: 93/72 98/72 99/87  112/87  Pulse: 91 95 96   Resp: (!) 21 16 16 18   Temp:   (!) 97.2 F  (36.2 C) (!) 97.5 F (36.4 C)  TempSrc:   Oral   SpO2: 97% 100% 100% 98%  Weight:      Height:         Author: Drue ONEIDA Potter, MD 04/19/2024 4:05 PM  For on call review [email or web address].

## 2024-04-19 NOTE — Anesthesia Preprocedure Evaluation (Addendum)
 Anesthesia Evaluation  Patient identified by MRN, date of birth, ID band Patient awake    Reviewed: Allergy & Precautions, NPO status , Patient's Chart, lab work & pertinent test results  History of Anesthesia Complications Negative for: history of anesthetic complications  Airway Mallampati: III  TM Distance: <3 FB Neck ROM: full    Dental  (+) Chipped, Poor Dentition, Missing   Pulmonary neg shortness of breath, COPD, Current Smoker and Patient abstained from smoking.   + rhonchi        Cardiovascular hypertension, (-) angina + CAD (s/p MI and stents) and + Past MI  Normal cardiovascular exam  ECG 04/15/24:  Normal sinus rhythm Septal infarct (cited on or before 10-Oct-2022) ST & T wave abnormality, consider anterolateral ischemia   Neuro/Psych Alcohol use disorder negative neurological ROS     GI/Hepatic negative GI ROS, Neg liver ROS,GERD  Controlled,,Cachexia    Endo/Other  negative endocrine ROS    Renal/GU Renal disease (AKI)  negative genitourinary   Musculoskeletal   Abdominal   Peds  Hematology negative hematology ROS (+) Blood dyscrasia (acute on chronic with melena this admission), anemia   Anesthesia Other Findings Past Medical History: No date: COPD (chronic obstructive pulmonary disease) (HCC) No date: Coronary artery disease No date: Current every day smoker No date: Dyspnea     Comment:  with exertion No date: GERD (gastroesophageal reflux disease) No date: Heart attack (HCC)     Comment:  X 2 No date: Hyperlipidemia No date: Hypertension   Cardiology note 10/02/23:  63 y.o. male with  1. Primary hypertension  2. Non-ST elevation myocardial infarction (NSTEMI) (CMS/HHS-HCC)  3. Status post placement of bare metal coronary artery stent  4. Pure hypercholesterolemia  5. ETOH abuse  6. Tobacco abuse   63 year old gentleman with known coronary artery disease, currently without chest  pain. Previous stress echocardiogram revealed mildly reduced left ventricular function, mild exercise-induced hypokinesis consistent with patient's prior non-STEMI and coronary anatomy. The patient has mild chronic exertional dyspnea due to underlying COPD and ongoing tobacco abuse. The patient has essential hypertension, blood pressure well controlled on current BP medications. The patient has hyperlipidemia with adequately controlled LDL cholesterol on simvastatin.  Plan   1. Continue current medications 2. Continue low-sodium diet 3. DASH diet printed instructions given to the patient 4. Continue simvastatin for hyperlipidemia management  5. Strongly encouraged patient to stop smoking 6. Advised patient to limit alcohol intake 7. Return to clinic for follow-up in 6 months  No orders of the defined types were placed in this encounter.  Return in about 6 months (around 03/31/2024).    Reproductive/Obstetrics negative OB ROS                              Anesthesia Physical Anesthesia Plan  ASA: 4  Anesthesia Plan: General   Post-op Pain Management:    Induction: Intravenous  PONV Risk Score and Plan: Propofol  infusion and TIVA  Airway Management Planned: Natural Airway  Additional Equipment:   Intra-op Plan:   Post-operative Plan:   Informed Consent: I have reviewed the patients History and Physical, chart, labs and discussed the procedure including the risks, benefits and alternatives for the proposed anesthesia with the patient or authorized representative who has indicated his/her understanding and acceptance.     Dental Advisory Given  Plan Discussed with: Anesthesiologist, CRNA and Surgeon  Anesthesia Plan Comments: (Patient was consented for both his I&D and  EGD before the anesthetic for I&D was initiated   Patient consented for risks of anesthesia including but not limited to:  - adverse reactions to medications - risk of airway  placement if required - damage to eyes, teeth, lips or other oral mucosa - nerve damage due to positioning  - sore throat or hoarseness - Damage to heart, brain, nerves, lungs, other parts of body or loss of life  Patient voiced understanding and assent.)         Anesthesia Quick Evaluation

## 2024-04-19 NOTE — Op Note (Signed)
 Full Operative Report  Date of Operation: 10:26 AM, 04/19/2024   Patient: Bernard Harris [facility] - 63 y.o. male  Surgeon: Malvin Marsa FALCON, DPM   Assistant: None  Diagnosis: Osteomyelitis multiple digits bilateral foot  Procedure:  1.  Transmetatarsal amputation, right foot 2.  Transmetatarsal amputation, left foot    Anesthesia: General  No responsible provider has been recorded for the case.  Anesthesiologist: Piscitello, Fairy POUR, MD CRNA: Ledora Duncan, CRNA   Estimated Blood Loss: 100 mL  Hemostasis: 1) Anatomical dissection, mechanical compression, electrocautery 2) no tourniquet was used on either extremity during the procedure  Implants: * No implants in log *  Materials: Prolene 3-0, skin staples  Injectables: 1) Pre-operatively: 10 cc of 50:50 mixture 1%lidocaine  plain and 0.5% marcaine  plain bilateral foot 2) Post-operatively: None   Specimens: - Pathology: Right forefoot for pathology, left forefoot for pathology - Microbiology: Tissue culture bone right hallux, left deep tissue culture from ulceration plantar aspect fourth MPJ, fourth proximal phalanx base bone culture left foot   Antibiotics: IV antibiotics given per schedule on the floor  Drains: None  Complications: Patient tolerated the procedure well without complication.   Operative findings: As below in detailed report  Indications for Procedure: Gagan Dillion [place] presents to [place], [place] FAL[place], [place] with a chief complaint of severe lack of hygiene to bilateral foot with elongated nails and necrotic appearance of multiple digits.  On the right foot there is concern for several areas of osteomyelitis including the 1st, 4th and 5th toe at least.  On the left foot CT concerning for osteomyelitis in the 1st and 4th toe at least with other digital areas of concern.  The patient has failed conservative treatments of various modalities. At this time the patient has elected to  proceed with surgical correction. All alternatives, risks, and complications of the procedures were thoroughly explained to the patient. Patient exhibits appropriate understanding of all discussion points and informed consent was signed and obtained in the chart with no guarantees to surgical outcome given or implied.  Description of Procedure: Patient was brought to the operating room. Patient remained on their hospital bed in the supine position. A surgical timeout was performed and all members of the operating room, the procedure, and the surgical site were identified. anesthesia occurred as per anesthesia record.  The operative lower extremity as noted above was then prepped and draped in the usual sterile manner. The following procedure then began.  Attention was directed to the RIGHT lower extremity. A fish-mouth type incision was made proximal to the web spaces and encompassed the entire forefoot. The full-thickness incision was made with a longer plantar flap to allow for wound closure. The incision was continued through the soft tissue down to the shafts of the metatarsal bones. A 15 blade was then used to free up the periosteum on all the metatarsal shafts. Using an oscillating saw, the metatarsals were cut in a dorsal distal to plantar proximal orientation. The first metatarsal was beveled so that the medial cortex was shorter than the lateral, and the fifth metatarsal was beveled so that the lateral cortex was shorter than the medial, thus less prominent. The amputation was done so that a metatarsal parabola was maintained.  The distal portion including all the digits were freed from the metatarsals and soft tissue attachments. The specimen was passed off the field and sent for gross pathology.  A deep tissue culture was harvested with rongeur from the ulceration site at the right hallux medially  with bone sent for culture.  All remaining non-viable and necrotic tissues were sharply resected and  removed. Extensor and flexors tendons were grasped with a hemostat and cut proximally.  Decent bleeding was seen from the amputation soft tissues during procedure.  The surgical site was then flushed with copious amounts of saline.. The plantar flap was brought in approximation with the dorsal flap and the sutures material previously described was used for closure. Care was taken not to place the flaps under tension in order not to jeopardize the vascular supply.  Attention was directed to the LEFT lower extremity.  The toes were debrided of the overlying nails and there was concern for ulceration of the third toe with exposure of the distal phalanx following debridement.  High risk for osteomyelitis coupled with concern for osteomyelitis on the 4th and 1st toe on CT I decided to proceed with a transmetatarsal amputation for complete source control of concern for infection also due to extremely poor soft tissue quality and the digit level.  There was also noted to be an ulceration underlying the fourth metatarsal phalangeal joint that did probe down to bone.  Concern for underlying osteomyelitis possibly on CT. A fish-mouth type incision was made proximal to the web spaces and encompassed the entire forefoot. The full-thickness incision was made with a longer plantar flap to allow for wound closure.  The ulceration at the plantar aspect of the fourth MPJ was excised in triangular wedge fashion from the plantar flap.  The incision was continued through the soft tissue down to the shafts of the metatarsal bones. A 15 blade was then used to free up the periosteum on all the metatarsal shafts. Using an oscillating saw, the metatarsals were cut in a dorsal distal to plantar proximal orientation. The first metatarsal was beveled so that the medial cortex was shorter than the lateral, and the fifth metatarsal was beveled so that the lateral cortex was shorter than the medial, thus less prominent. The amputation was done  so that a metatarsal parabola was maintained.  The distal portion including all the digits were freed from the metatarsals and soft tissue attachments. The specimen was passed off the field and sent for gross pathology.  A deep tissue culture was harvested with rongeur from the plantar fourth MPJ ulceration.  A separate bone culture was harvested from the proximal phalanx base of the fourth MPJ which did appear slightly necrotic and soft.  All remaining non-viable and necrotic tissues were sharply resected and removed. Extensor and flexors tendons were grasped with a hemostat and cut proximally.  Decent bleeding was seen from the amputation soft tissues during procedure.  The surgical site was then flushed with copious amounts of saline. The plantar flap was brought in approximation with the dorsal flap and the sutures material previously described was used for closure. Care was taken not to place the flaps under tension in order not to jeopardize the vascular supply.  The surgical site was then dressed with Xeroform 4 x 4 gauze ABD pad Kerlix and Ace wrap bilaterally. The patient tolerated both the procedure and anesthesia well with vital signs stable throughout. The patient was transferred in good condition and all vital signs stable  from the OR to recovery under the discretion of anesthesia.  Condition: Vital signs stable, neurovascular status unchanged from preoperative   Surgical plan:  Expect clean margin bilaterally.  Decent bleeding seen intraoperatively.  More so on the left than on the right.  However concern is  for severe atrophy of the soft tissues at the flap site especially the dorsal soft tissues will place him at risk for dehiscence and poor wound healing.  Recommend high-protein supplementation strict nonweightbearing bilateral lower extremity until some healing is seen likely 2 to 3 weeks at least.  The patient will be nonweightbearing in a soft dressing to the operative limb until  further instructed. The dressing is to remain clean, dry, and intact. Will continue to follow unless noted elsewhere.   Marsa Honour, DPM [facility]

## 2024-04-19 NOTE — Progress Notes (Signed)
 History and Physical Interval Note:  04/19/2024 8:49 AM  Dorn Tod Hof  has presented today for surgery, with the diagnosis of osteomyelitis right forefoot multiple digtis 1, 4, 5. Wounds on left foot.  The various methods of treatment have been discussed with the patient and family. After consideration of risks, benefits and other options for treatment, the patient has consented to   Procedure(s) with comments: IRRIGATION AND DEBRIDEMENT FOOT (Bilateral) - Bilateral foot debridement, possible trans met amputation - Based on XR and CT findings, I recommend definite right foot transmetatarsal amputation, possible left vs toe amputation vs debridement left foot as a surgical intervention.  The patient's history has been reviewed, patient examined, no change in status, stable for surgery.  I have reviewed the patient's chart and labs.  Questions were answered to the patient's satisfaction.     Marsa FALCON Ellanora Rayborn

## 2024-04-19 NOTE — Progress Notes (Signed)
 Cardiomyopathy education provided.   Please do not hesitate to reach out with questions or concerns,  Jaun Bash, PharmD, CPP, BCPS, [facility] Heart Failure Pharmacist  Phone - [phone] 04/19/2024 1:36 PM

## 2024-04-19 NOTE — Anesthesia Procedure Notes (Signed)
 Procedure Name: General with mask airway Date/Time: 04/19/2024 9:13 AM  Performed by: Ledora Duncan, CRNAPre-anesthesia Checklist: Patient identified, Emergency Drugs available, Suction available and Patient being monitored Patient Re-evaluated:Patient Re-evaluated prior to induction Oxygen Delivery Method: Simple face mask Preoxygenation: Pre-oxygenation with 100% oxygen Induction Type: IV induction Placement Confirmation: positive ETCO2 and breath sounds checked- equal and bilateral Dental Injury: Teeth and Oropharynx as per pre-operative assessment

## 2024-04-19 NOTE — Transfer of Care (Addendum)
 Immediate Anesthesia Transfer of Care Note  Patient: Bernard Harris [facility]  Procedure(s) Performed: IRRIGATION AND DEBRIDEMENT FOOT (Bilateral) EGD (ESOPHAGOGASTRODUODENOSCOPY)  Patient Location: PACU  Anesthesia Type:General  Level of Consciousness: drowsy and patient cooperative  Airway & Oxygen Therapy: Patient Spontanous Breathing and Patient connected to face mask oxygen  Post-op Assessment: Report given to RN and Post -op Vital signs reviewed and stable  Post vital signs: Reviewed and stable, bp treated w/ neo ad noted, sbp 129/106 currently   Last Vitals:  Vitals Value Taken Time  BP 67/57 04/19/24 10:23  Temp    Pulse 77 04/19/24 10:25  Resp 20 04/19/24 10:25  SpO2 100 % 04/19/24 10:25  Vitals shown include unfiled device data.  Last Pain:  Vitals:   04/19/24 0429  TempSrc: Oral  PainSc:          Complications: No notable events documented.

## 2024-04-19 NOTE — Transfer of Care (Signed)
 Immediate Anesthesia Transfer of Care Note  Patient: Bernard Harris [facility]  Procedure(s) Performed: EGD (ESOPHAGOGASTRODUODENOSCOPY)  Patient Location: Endoscopy Unit  Anesthesia Type:General  Level of Consciousness: sedated, drowsy, and lethargic  Airway & Oxygen Therapy: Patient Spontanous Breathing  Post-op Assessment: Report given to RN and Post -op Vital signs reviewed and stable  Post vital signs: Reviewed and stable  Last Vitals:  Vitals Value Taken Time  BP 117/81 1159  Temp    Pulse 70 1159  Resp 30 1159  SpO2 100% 1159    Last Pain:  Vitals:   04/19/24 1100  TempSrc:   PainSc: 4          Complications: No notable events documented.

## 2024-04-19 NOTE — Anesthesia Postprocedure Evaluation (Signed)
 Anesthesia Post Note  Patient: Bernard Harris [facility]  Procedure(s) Performed: IRRIGATION AND DEBRIDEMENT FOOT (Bilateral)  Patient location during evaluation: PACU Anesthesia Type: General Level of consciousness: awake and alert Pain management: pain level controlled Vital Signs Assessment: post-procedure vital signs reviewed and stable Respiratory status: spontaneous breathing, nonlabored ventilation and respiratory function stable Cardiovascular status: blood pressure returned to baseline and stable Postop Assessment: no apparent nausea or vomiting Anesthetic complications: no   No notable events documented.   Last Vitals:  Vitals:   04/19/24 1045 04/19/24 1100  BP: (!) 126/104 128/88  Pulse: 100 94  Resp: (!) 22 20  Temp:    SpO2: 97% 100%    Last Pain:  Vitals:   04/19/24 1023  TempSrc:   PainSc: Asleep                 Fairy POUR Meleny Tregoning

## 2024-04-19 NOTE — Anesthesia Preprocedure Evaluation (Signed)
 Anesthesia Evaluation  Patient identified by MRN, date of birth, ID band Patient awake    Reviewed: Allergy & Precautions, NPO status , Patient's Chart, lab work & pertinent test results  History of Anesthesia Complications Negative for: history of anesthetic complications  Airway Mallampati: III  TM Distance: <3 FB Neck ROM: full    Dental  (+) Chipped, Poor Dentition, Missing   Pulmonary neg shortness of breath, COPD, Current Smoker and Patient abstained from smoking.   + rhonchi        Cardiovascular hypertension, (-) angina + CAD (s/p MI and stents) and + Past MI  Normal cardiovascular exam  ECG 04/15/24:  Normal sinus rhythm Septal infarct (cited on or before 10-Oct-2022) ST & T wave abnormality, consider anterolateral ischemia   Neuro/Psych Alcohol use disorder negative neurological ROS     GI/Hepatic negative GI ROS, Neg liver ROS,GERD  Controlled,,Cachexia    Endo/Other  negative endocrine ROS    Renal/GU Renal disease (AKI)  negative genitourinary   Musculoskeletal   Abdominal   Peds  Hematology negative hematology ROS (+) Blood dyscrasia (acute on chronic with melena this admission), anemia   Anesthesia Other Findings Past Medical History: No date: COPD (chronic obstructive pulmonary disease) (HCC) No date: Coronary artery disease No date: Current every day smoker No date: Dyspnea     Comment:  with exertion No date: GERD (gastroesophageal reflux disease) No date: Heart attack (HCC)     Comment:  X 2 No date: Hyperlipidemia No date: Hypertension   Cardiology note 10/02/23:  63 y.o. male with  1. Primary hypertension  2. Non-ST elevation myocardial infarction (NSTEMI) (CMS/HHS-HCC)  3. Status post placement of bare metal coronary artery stent  4. Pure hypercholesterolemia  5. ETOH abuse  6. Tobacco abuse   63 year old gentleman with known coronary artery disease, currently without chest  pain. Previous stress echocardiogram revealed mildly reduced left ventricular function, mild exercise-induced hypokinesis consistent with patient's prior non-STEMI and coronary anatomy. The patient has mild chronic exertional dyspnea due to underlying COPD and ongoing tobacco abuse. The patient has essential hypertension, blood pressure well controlled on current BP medications. The patient has hyperlipidemia with adequately controlled LDL cholesterol on simvastatin.  Plan   1. Continue current medications 2. Continue low-sodium diet 3. DASH diet printed instructions given to the patient 4. Continue simvastatin for hyperlipidemia management  5. Strongly encouraged patient to stop smoking 6. Advised patient to limit alcohol intake 7. Return to clinic for follow-up in 6 months  No orders of the defined types were placed in this encounter.  Return in about 6 months (around 03/31/2024).    Reproductive/Obstetrics negative OB ROS                              Anesthesia Physical Anesthesia Plan  ASA: 4  Anesthesia Plan: General   Post-op Pain Management:    Induction: Intravenous  PONV Risk Score and Plan: Propofol  infusion and TIVA  Airway Management Planned: Natural Airway  Additional Equipment:   Intra-op Plan:   Post-operative Plan:   Informed Consent: I have reviewed the patients History and Physical, chart, labs and discussed the procedure including the risks, benefits and alternatives for the proposed anesthesia with the patient or authorized representative who has indicated his/her understanding and acceptance.     Dental Advisory Given  Plan Discussed with: Anesthesiologist, CRNA and Surgeon  Anesthesia Plan Comments: (Patient was consented for both his I&D and  EGD before the anesthetic for I&D was initiated   Patient consented for risks of anesthesia including but not limited to:  - adverse reactions to medications - risk of airway  placement if required - damage to eyes, teeth, lips or other oral mucosa - nerve damage due to positioning  - sore throat or hoarseness - Damage to heart, brain, nerves, lungs, other parts of body or loss of life  Patient voiced understanding and assent.)         Anesthesia Quick Evaluation

## 2024-04-19 NOTE — Op Note (Signed)
 [facility] Gastroenterology Patient Name: Bernard Harris Procedure Date: 04/19/2024 11:39 AM MRN: [id number] Account #: [id number] Date of Birth: 01-23-1961 Admit Type: Inpatient Age: 63 Room: [facility] ENDO ROOM 4 Gender: Male Note Status: Finalized Instrument Name: Upper GI Scope [id number] Procedure:             Upper GI endoscopy Indications:           Acute post hemorrhagic anemia Providers:             Rogelia Copping MD, MD Referring MD:          Alm HERO. Glover, MD (Referring MD) Medicines:             Propofol  per Anesthesia Complications:         No immediate complications. Procedure:             Pre-Anesthesia Assessment:                        - Prior to the procedure, a History and Physical was                         performed, and patient medications and allergies were                         reviewed. The patient's tolerance of previous                         anesthesia was also reviewed. The risks and benefits                         of the procedure and the sedation options and risks                         were discussed with the patient. All questions were                         answered, and informed consent was obtained. Prior                         Anticoagulants: The patient has taken no anticoagulant                         or antiplatelet agents. ASA Grade Assessment: II - A                         patient with mild systemic disease. After reviewing                         the risks and benefits, the patient was deemed in                         satisfactory condition to undergo the procedure.                        After obtaining informed consent, the endoscope was                         passed under direct vision. Throughout the procedure,  the patient's blood pressure, pulse, and oxygen                         saturations were monitored continuously. The Endoscope                         was introduced through the  mouth, and advanced to the                         second part of duodenum. The upper GI endoscopy was                         accomplished without difficulty. The patient tolerated                         the procedure well. Findings:      The examined esophagus was normal.      A large amount of food (residue) was found in the entire examined       stomach.      The examined duodenum was normal.      No sign of any old blood or new blood. Impression:            - Normal esophagus.                        - A large amount of food (residue) in the stomach.                        - Normal examined duodenum.                        - No sign of any old blood or new blood.                        - No specimens collected. Recommendation:        - Return patient to hospital ward for ongoing care.                        - Resume regular diet.                        - Continue present medications.                        - If Hb stays stable then floow up as an outpatient                         with Dr. Aundria (primary GI doc) for an outpatient EGD. Procedure Code(s):     --- Professional ---                        [phone], Esophagogastroduodenoscopy, flexible,                         transoral; diagnostic, including collection of                         specimen(s) by brushing or washing, when performed                         (  separate procedure) Diagnosis Code(s):     --- Professional ---                        D62, Acute posthemorrhagic anemia CPT copyright 2022 American Medical Association. All rights reserved. The codes documented in this report are preliminary and upon coder review may  be revised to meet current compliance requirements. Rogelia Copping MD, MD 04/19/2024 11:53:17 AM This report has been signed electronically. Number of Addenda: 0 Note Initiated On: 04/19/2024 11:39 AM Estimated Blood Loss:  Estimated blood loss: none.      [facility]

## 2024-04-19 NOTE — Plan of Care (Signed)
 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 Followed by upper endoscopy.  Having some difficulty swallowing after procedures to still taking ice chips.

## 2024-04-19 NOTE — Anesthesia Postprocedure Evaluation (Signed)
 Anesthesia Post Note  Patient: Bernard Harris [facility]  Procedure(s) Performed: EGD (ESOPHAGOGASTRODUODENOSCOPY)  Patient location during evaluation: Endoscopy Anesthesia Type: General Level of consciousness: awake and alert Pain management: pain level controlled Vital Signs Assessment: post-procedure vital signs reviewed and stable Respiratory status: spontaneous breathing, nonlabored ventilation and respiratory function stable Cardiovascular status: blood pressure returned to baseline and stable Postop Assessment: no apparent nausea or vomiting Anesthetic complications: no   No notable events documented.   Last Vitals:  Vitals:   04/19/24 1206 04/19/24 1225  BP: 93/72 98/72  Pulse: 91 95  Resp: (!) 21 16  Temp:    SpO2: 97% 100%    Last Pain:  Vitals:   04/19/24 1225  TempSrc:   PainSc: Asleep                 Camellia Merilee Louder

## 2024-04-20 ENCOUNTER — Encounter: Payer: Self-pay | Admitting: Podiatry

## 2024-04-20 DIAGNOSIS — E43 Unspecified severe protein-calorie malnutrition: Secondary | ICD-10-CM | POA: Insufficient documentation

## 2024-04-20 DIAGNOSIS — K921 Melena: Secondary | ICD-10-CM | POA: Diagnosis not present

## 2024-04-20 LAB — CBC WITH DIFFERENTIAL/PLATELET
Abs Immature Granulocytes: 0.07 K/uL (ref 0.00–0.07)
Basophils Absolute: 0.1 K/uL (ref 0.0–0.1)
Basophils Relative: 1 %
Eosinophils Absolute: 0 K/uL (ref 0.0–0.5)
Eosinophils Relative: 0 %
HCT: 35.2 % — ABNORMAL LOW (ref 39.0–52.0)
Hemoglobin: 11.6 g/dL — ABNORMAL LOW (ref 13.0–17.0)
Immature Granulocytes: 0 %
Lymphocytes Relative: 10 %
Lymphs Abs: 1.7 K/uL (ref 0.7–4.0)
MCH: 28.6 pg (ref 26.0–34.0)
MCHC: 33 g/dL (ref 30.0–36.0)
MCV: 86.7 fL (ref 80.0–100.0)
Monocytes Absolute: 1.1 K/uL — ABNORMAL HIGH (ref 0.1–1.0)
Monocytes Relative: 7 %
Neutro Abs: 13.3 K/uL — ABNORMAL HIGH (ref 1.7–7.7)
Neutrophils Relative %: 82 %
Platelets: 207 K/uL (ref 150–400)
RBC: 4.06 MIL/uL — ABNORMAL LOW (ref 4.22–5.81)
RDW: 18.4 % — ABNORMAL HIGH (ref 11.5–15.5)
WBC: 16.3 K/uL — ABNORMAL HIGH (ref 4.0–10.5)
nRBC: 0 % (ref 0.0–0.2)

## 2024-04-20 LAB — BASIC METABOLIC PANEL WITH GFR
Anion gap: 11 (ref 5–15)
BUN: 22 mg/dL (ref 8–23)
CO2: 16 mmol/L — ABNORMAL LOW (ref 22–32)
Calcium: 6 mg/dL — CL (ref 8.9–10.3)
Chloride: 109 mmol/L (ref 98–111)
Creatinine, Ser: 0.85 mg/dL (ref 0.61–1.24)
GFR, Estimated: >60 mL/min (ref >60)
Glucose, Bld: 166 mg/dL — ABNORMAL HIGH (ref 70–99)
Potassium: 4 mmol/L (ref 3.5–5.1)
Sodium: 136 mmol/L (ref 135–145)

## 2024-04-20 LAB — HEPATIC FUNCTION PANEL
ALT: 25 U/L (ref 0–44)
AST: 34 U/L (ref 15–41)
Albumin: 2.7 g/dL — ABNORMAL LOW (ref 3.5–5.0)
Alkaline Phosphatase: 58 U/L (ref 38–126)
Bilirubin, Direct: 0.7 mg/dL — ABNORMAL HIGH (ref 0.0–0.2)
Indirect Bilirubin: 1 mg/dL — ABNORMAL HIGH (ref 0.3–0.9)
Total Bilirubin: 1.7 mg/dL — ABNORMAL HIGH (ref 0.0–1.2)
Total Protein: 6.5 g/dL (ref 6.5–8.1)

## 2024-04-20 MED ORDER — BOOST / RESOURCE BREEZE PO LIQD CUSTOM
1.0000 | Freq: Three times a day (TID) | ORAL | Status: DC
  Administered 2024-04-20 – 2024-04-29 (×12): 1 via ORAL

## 2024-04-20 MED ORDER — PROSOURCE PLUS PO LIQD
30.0000 mL | Freq: Two times a day (BID) | ORAL | Status: DC
  Administered 2024-04-20 – 2024-04-26 (×8): 30 mL via ORAL

## 2024-04-20 MED ORDER — ZINC SULFATE 220 (50 ZN) MG PO CAPS
220.0000 mg | ORAL_CAPSULE | Freq: Every day | ORAL | Status: DC
Start: 2024-04-20 — End: 2024-04-30
  Administered 2024-04-20 – 2024-04-30 (×11): 220 mg via ORAL
  Filled 2024-04-20 (×11): qty 1

## 2024-04-20 MED ORDER — VITAMIN C 500 MG PO TABS
500.0000 mg | ORAL_TABLET | Freq: Two times a day (BID) | ORAL | Status: DC
  Administered 2024-04-20 – 2024-04-30 (×21): 500 mg via ORAL
  Filled 2024-04-20 (×22): qty 1

## 2024-04-20 MED ORDER — OYSTER SHELL CALCIUM/D3 500-5 MG-MCG PO TABS
2.0000 | ORAL_TABLET | Freq: Two times a day (BID) | ORAL | Status: DC
  Administered 2024-04-20 – 2024-04-30 (×20): 2 via ORAL
  Filled 2024-04-20 (×22): qty 2

## 2024-04-20 MED ORDER — AMOXICILLIN-POT CLAVULANATE 875-125 MG PO TABS
1.0000 | ORAL_TABLET | Freq: Two times a day (BID) | ORAL | Status: AC
  Administered 2024-04-20 – 2024-04-24 (×10): 1 via ORAL
  Filled 2024-04-20 (×10): qty 1

## 2024-04-20 MED ORDER — OXYCODONE-ACETAMINOPHEN 5-325 MG PO TABS
1.0000 | ORAL_TABLET | ORAL | Status: DC | PRN
  Administered 2024-04-20 – 2024-04-21 (×3): 1 via ORAL
  Filled 2024-04-20 (×4): qty 1

## 2024-04-20 MED ORDER — CALCIUM GLUCONATE-NACL 1-0.675 GM/50ML-% IV SOLN
1.0000 g | Freq: Once | INTRAVENOUS | Status: AC
  Administered 2024-04-20: 1000 mg via INTRAVENOUS
  Filled 2024-04-20: qty 50

## 2024-04-20 NOTE — Progress Notes (Signed)
 Heart Failure Navigator Progress Note  Assessed for Heart & Vascular TOC clinic readiness.  Patient does not meet criteria due to current [facility] Cardiology patient.   Navigator will sign off at this time.  Roxy Horseman, RN, BSN [facility] Heart Failure Navigator Secure Chat Only

## 2024-04-20 NOTE — Progress Notes (Signed)
 Progress Note   Patient: Bernard Harris FMW:[id number] DOB: Nov 08, 1960 DOA: 04/15/2024     4 DOS: the patient was seen and examined on 04/20/2024      Brief hospital course: From HPI Bernard Harris is a 63 y.o. male with medical history significant for COPD, tobacco use disorder, COPD, HTN, CAD with history of stent 2011, being admitted with melena and acute blood loss anemia(Hb 5.4) as well as severe AKI(creatinine 6.44) believe NSAID related.  Patient presented with 2 weeks of fatigue and worsening dyspnea on exertion.  He has noted black stool for the past week.  Reports he has been having knee pain from a fall 3 weeks ago and has been using ibuprofen up to 7 tablets twice a day based on what a friend told him his doctor prescribed for similar problem. Endorses drinking anywhere from 1-5 drinks of vodka daily but has not done so recently due to the weakness(states half gallon of vodka lasts 2 to 3 weeks) In the ED hemoglobin of 5.4, with most recent of 7.8 back in April 2025. Other hospital course as noted below     Assessment and Plan: Melena NSAID induced gastropathy/alcoholic gastritis Acute on likely chronic blood loss anemia  Hypotension secondary to hypovolemia/acute blood loss On presentation patient had hemoglobin of 5.4, with most recent of 7.8 back in April 2025. Status post 3 units of blood transfusion Continue Protonix   Octreotide  discontinued Patient underwent EGD by GI which did not show acute bleeding, EGD was also limited as patient had lot of food intake at. Gastroenterologist planning outpatient follow-up with repeat EGD    AKI (acute kidney injury) (HCC) Anion gap metabolic acidosis Suspect NSAID induced nephropathy Suspect ATN/(?interstitial nephritis) related to NSAIDs in combination with hypovolemia/hypotension from blood loss CT chest abdomen and pelvis with no concerning findings and kidneys/urinary system Received a 2 L fluid bolus in the  ED Monitor renal function and avoid nephrotoxins I discussed the plan of care with nephrology Renal function currently back to baseline and nephrologist have signed off   Electrolyte abnormality Hypokalemia, hypomagnesemia and hyponatremia Likely related to hypovolemia, hypotension, alcohol use Electrolytes improved   CAD S/P percutaneous coronary angioplasty Elevated troponin Troponin elevated in the 200s but downtrending, likely supply/demand mismatch Will hold aspirin due to bleeding Hold metoprolol, lisinopril, nitro due to hypotension Echocardiogram showing EF 40 to 45% with normal diastolic parameter I discussed with Dr. Darron and patient follows up with Dr. Nolen team and they recommend outpatient follow-up   Cachexia [facility]) Suspect severe protein calorie malnutrition related to alcohol use disorder BMI 15 Dietitian consulted     Tobacco use disorder Nicotine  patch   Alcohol use disorder Given thiamine  folate and multivitamin in the ED CIWA withdrawal protocol   Bilateral foot gangrene I discussed the case with podiatry Patient underwent transmetatarsal amputation of bilateral foot by podiatrist on 04/19/2024 Podiatrist recommended nonweightbearing TOC consulted  PT OT has recommended skilled nursing facility placement     DVT prophylaxis: SCD   Consults: GI, nephrology   Advance Care Planning:   Code Status: Full Code    Family Communication: Discussed with patient's sister   Disposition Plan: PT recommends skilled nursing facility     Subjective:  Denies nausea vomiting abdominal pain or chest pain Feeling much better TOC working on placement   Physical Exam: Vitals and nursing note reviewed.  Constitutional:      General: He is awake. He is not in acute distress.    Appearance:  He is cachectic.  HENT:     Head: Normocephalic and atraumatic.  Cardiovascular:     Rate and Rhythm: Normal rate and regular rhythm.     Heart sounds: Normal heart  sounds.  Pulmonary:     Effort: Pulmonary effort is normal.     Breath sounds: Normal breath sounds.  Abdominal:     Palpations: Abdomen is soft.     Tenderness: There is no abdominal tenderness.  Neurological:     Mental Status: He is alert. Mental status is at baseline.  Musculoskeletal: Dressing noted to bilateral foot    Data Reviewed:  Vitals:   04/20/24 0500 04/20/24 0853 04/20/24 1150 04/20/24 1625  BP:  134/85 (!) 133/94 105/83  Pulse:  90 (!) 103 (!) 105  Resp:  18 16 17   Temp:  98.2 F (36.8 C) 98 F (36.7 C) 97.8 F (36.6 C)  TempSrc:  Oral Oral Oral  SpO2:  95% 100% 100%  Weight: 41.2 kg     Height:        Data Reviewed:    Latest Ref Rng & Units 04/20/2024    5:04 AM 04/19/2024    5:18 AM 04/18/2024   11:52 AM  CBC  WBC 4.0 - 10.5 K/uL 16.3  11.1    Hemoglobin 13.0 - 17.0 g/dL 88.3  87.6  87.4   Hematocrit 39.0 - 52.0 % 35.2  38.1    Platelets 150 - 400 K/uL 207  250         Latest Ref Rng & Units 04/20/2024    5:04 AM 04/19/2024    5:18 AM 04/18/2024    5:35 AM  BMP  Glucose 70 - 99 mg/dL 833  812  842    842   BUN 8 - 23 mg/dL 22  25  40    41   Creatinine 0.61 - 1.24 mg/dL 9.14  8.58  8.30    8.36   Sodium 135 - 145 mmol/L 136  137  138    137   Potassium 3.5 - 5.1 mmol/L 4.0  4.8  3.2    3.2   Chloride 98 - 111 mmol/L 109  108  108    107   CO2 22 - 32 mmol/L 16  21  20    20    Calcium  8.9 - 10.3 mg/dL 6.0  6.7  7.6    7.5       Author: Drue ONEIDA Potter, MD 04/20/2024 4:26 PM  For on call review [email or web address].

## 2024-04-20 NOTE — Evaluation (Signed)
 Physical Therapy Evaluation Patient Details Name: Bernard Harris MRN: [id number] DOB: 1961/05/30 Today's Date: 04/20/2024  History of Present Illness  Pt is a 63 y.o. male presenting to hospital 04/15/24 with c/o weakness; c/o knee pain from fall 3 weeks prior.  Pt admitted with melena, NSAID induced gastropathy/alcoholic gastritis, acute on likely chronic blood loss anemia, hypotension, AKI, anion gap metabolic acidosis, electrolyte abnormality, cachexia.  S/p B TMA 04/19/24.  PMH includes F femoral head AVN, alcohol use disorder, CAD s/p stent, COPD (no home O2), htn, HLD, dyspnea with exertion, B foot surgery in 1980's.  Clinical Impression  PT/OT co-evaluation performed.  Prior to recent medical concerns and fall about 3 weeks ago (injuring R knee), pt reports being ambulatory (pt using SW vs cane vs manual w/c depending on situation).  5/10 R foot pain reported during session (pt with recent pain medication); pt reporting no L foot pain during session.  Currently pt is max assist with bed mobility; min assist progressing to CGA with sitting balance (d/t posterior lean); and max assist x2 to laterally scoot to R along bed (towards [facility]).  Pt would currently benefit from skilled PT to address noted impairments and functional limitations (see below for any additional details).  Upon hospital discharge, pt would benefit from ongoing therapy.     If plan is discharge home, recommend the following: Two people to help with walking and/or transfers;A lot of help with bathing/dressing/bathroom;Assistance with cooking/housework;Assist for transportation;Help with stairs or ramp for entrance   Can travel by private vehicle   No    Equipment Recommendations Other (comment) (TBD at next facility)  Recommendations for Other Services       Functional Status Assessment Patient has had a recent decline in their functional status and demonstrates the ability to make significant improvements in function in  a reasonable and predictable amount of time.     Precautions / Restrictions Precautions Precautions: Fall Recall of Precautions/Restrictions: Impaired Restrictions Weight Bearing Restrictions Per Provider Order: Yes RLE Weight Bearing Per Provider Order: Non weight bearing LLE Weight Bearing Per Provider Order: Non weight bearing      Mobility  Bed Mobility Overal bed mobility: Needs Assistance Bed Mobility: Supine to Sit, Sit to Supine     Supine to sit: Max assist, HOB elevated Sit to supine: Max assist, HOB elevated   General bed mobility comments: assist for trunk and LE's; vc's for technique    Transfers Overall transfer level: Needs assistance Equipment used: None Transfers: Bed to chair/wheelchair/BSC             General transfer comment: lateral scoot up the bed to the R approx 3x with MAX A +2; vc's for technique    Ambulation/Gait               General Gait Details: Not appropriate at this time (pt NWB'ing B LE's)  Stairs            Wheelchair Mobility     Tilt Bed    Modified Rankin (Stroke Patients Only)       Balance Overall balance assessment: Needs assistance Sitting-balance support: Bilateral upper extremity supported, Feet unsupported Sitting balance-Leahy Scale: Fair Sitting balance - Comments: min assist (posterior lean) progressing to CGA Postural control: Posterior lean     Standing balance comment: Deferred (pt NWB'ing B LE's)  Pertinent Vitals/Pain Pain Assessment Pain Assessment: 0-10 Pain Score: 5  Pain Location: R foot Pain Descriptors / Indicators: Discomfort, Sharp, Aching Pain Intervention(s): Limited activity within patient's tolerance, Monitored during session, Premedicated before session, Repositioned, Other (comment) (RN notified)    Home Living Family/patient expects to be discharged to:: Private residence Living Arrangements: Alone Available Help at  Discharge: Family             Home Equipment: Standard Walker;Cane - single point;Wheelchair - manual      Prior Function Prior Level of Function : Needs assist             Mobility Comments: Pt reports falling on porch about 1 month ago sustaining R knee pain limiting walking; pt was using standard walker or SPC (or manual w/c--would push with B UE's) for mobility ADLs Comments: MOD I for feeding/grooming/dressing/toileting tasks per pt report; unable to get into shower recently so sponge bathing,  pt reports increasing weakness over the last month so he required assist for IADLs but was managing meds on his own     Extremity/Trunk Assessment   Upper Extremity Assessment Upper Extremity Assessment: Generalized weakness    Lower Extremity Assessment Lower Extremity Assessment: Generalized weakness (able to wiggle ankles a little bit)    Cervical / Trunk Assessment Cervical / Trunk Assessment: Normal  Communication   Communication Communication: Impaired Factors Affecting Communication: Reduced clarity of speech    Cognition Arousal: Alert Behavior During Therapy: Flat affect, WFL for tasks assessed/performed                             Following commands: Impaired Following commands impaired: Follows one step commands with increased time     Cueing Cueing Techniques: Verbal cues, Tactile cues, Visual cues     General Comments General comments (skin integrity, edema, etc.): VSS; B LE wrapped in ace wraps.  Nursing cleared pt for participation in physical therapy.  Pt agreeable to PT/OT session.    Exercises     Assessment/Plan    PT Assessment Patient needs continued PT services  PT Problem List Decreased strength;Decreased range of motion;Decreased activity tolerance;Decreased balance;Decreased mobility;Decreased knowledge of use of DME;Decreased knowledge of precautions;Decreased skin integrity;Pain       PT Treatment Interventions DME  instruction;Functional mobility training;Therapeutic activities;Therapeutic exercise;Balance training;Patient/family education;Wheelchair mobility training    PT Goals (Current goals can be found in the Care Plan section)  Acute Rehab PT Goals Patient Stated Goal: to improve pain and mobility PT Goal Formulation: With patient Time For Goal Achievement: 05/04/24 Potential to Achieve Goals: Fair    Frequency Min 2X/week     Co-evaluation PT/OT/SLP Co-Evaluation/Treatment: Yes Reason for Co-Treatment: To address functional/ADL transfers;Complexity of the patient's impairments (multi-system involvement);For patient/therapist safety   OT goals addressed during session: ADL's and self-care       AM-PAC PT 6 Clicks Mobility  Outcome Measure Help needed turning from your back to your side while in a flat bed without using bedrails?: A Little Help needed moving from lying on your back to sitting on the side of a flat bed without using bedrails?: A Lot Help needed moving to and from a bed to a chair (including a wheelchair)?: Total Help needed standing up from a chair using your arms (e.g., wheelchair or bedside chair)?: Total Help needed to walk in hospital room?: Total Help needed climbing 3-5 steps with a railing? : Total 6 Click Score: 9  End of Session   Activity Tolerance: Patient limited by pain Patient left: in bed;with call bell/phone within reach;with bed alarm set;with SCD's reapplied;Other (comment) (B heels floating via pillow support) Nurse Communication: Mobility status;Precautions;Weight bearing status;Other (comment) (Pt's pain status) PT Visit Diagnosis: Other abnormalities of gait and mobility (R26.89);Muscle weakness (generalized) (M62.81);History of falling (Z91.81);Pain Pain - Right/Left: Right Pain - part of body: Ankle and joints of foot    Time: 8890-8872 PT Time Calculation (min) (ACUTE ONLY): 18 min   Charges:   PT Evaluation $PT Eval Low Complexity:  1 Low   PT General Charges $$ ACUTE PT VISIT: 1 Visit        Damien Caulk, PT 04/20/24, 2:03 PM
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
## 2024-04-20 NOTE — Evaluation (Signed)
 Occupational Therapy Evaluation Patient Details Name: Bernard Harris MRN: [id number] DOB: [birth year] Today's Date: 04/20/2024   History of Present Illness   Pt is a 63 year old male admitted with melena and acute blood loss anemia(Hb 5.4) as well as severe AKI(creatinine 6.44) believe NSAID related; pt also admitted with Bilateral foot gangrene. Patient underwent transmetatarsal amputation of bilateral foot by podiatrist on 04/19/2024      PMH significant for  tobacco use disorder, COPD, HTN, CAD with history of stent 2011     Clinical Impressions Chart reviewed to date, pt greeted semi supine in bed, sleeping but agreeable to OT evaluation. PTA pt reports he has become increasingly weak over the last month, requiring either mwc, standard walker or SPC for mobility. He has been unable to step over his tub/shower so has been sponge bathing. Prior to that he reports he is MOD I for ADL/IADL. Pt presents with deficits in strength, endurance, activity tolerance, balance, cognition, affecting safe an optimal ADL completion. He requires MAX A for bed mobility, MAX A +2 for lateral scoots up the bed. BLE NWBing adhered to throughout. Frequent cueing and increased time for task completion required. Supervision required for grooming tasks in sitting. Pt is performing ADL/functional mobility below PLOF, will benefit from acute OT to address functional deficits and to facilitate optimal ADL/functional mobility performance. Pt is left as received, all needs met. OT will follow.     If plan is discharge home, recommend the following:   A lot of help with walking and/or transfers;A lot of help with bathing/dressing/bathroom;Direct supervision/assist for medications management;Direct supervision/assist for financial management;Assistance with cooking/housework;Help with stairs or ramp for entrance     Functional Status Assessment   Patient has had a recent decline in their functional status and  demonstrates the ability to make significant improvements in function in a reasonable and predictable amount of time.     Equipment Recommendations   Other (comment) (defer to next venue of care)     Recommendations for Other Services         Precautions/Restrictions   Precautions Precautions: Fall Recall of Precautions/Restrictions: Impaired Restrictions Weight Bearing Restrictions Per Provider Order: Yes RLE Weight Bearing Per Provider Order: Non weight bearing LLE Weight Bearing Per Provider Order: Non weight bearing     Mobility Bed Mobility Overal bed mobility: Needs Assistance Bed Mobility: Supine to Sit, Sit to Supine     Supine to sit: Max assist, HOB elevated Sit to supine: Max assist, HOB elevated        Transfers Overall transfer level: Needs assistance   Transfers: Bed to chair/wheelchair/BSC             General transfer comment: lateral scoot up the bed to the R approx 3x with MAX A +2      Balance Overall balance assessment: Needs assistance Sitting-balance support: Feet unsupported Sitting balance-Leahy Scale: Fair Sitting balance - Comments: MIN A, progress to CGA Postural control: Posterior lean     Standing balance comment: NT pt is NWBing BLE                           ADL either performed or assessed with clinical judgement   ADL Overall ADL's : Needs assistance/impaired     Grooming: Supervision/safety;Sitting;Wash/dry face               Lower Body Dressing: Maximal assistance Lower Body Dressing Details (indicate cue type and reason): anticipate  Vision Patient Visual Report: No change from baseline       Perception         Praxis         Pertinent Vitals/Pain Pain Assessment Pain Assessment: 0-10 Pain Score: 5  Pain Location: R foot Pain Descriptors / Indicators: Discomfort, Sharp Pain Intervention(s): Limited activity within patient's tolerance, Monitored  during session     Extremity/Trunk Assessment Upper Extremity Assessment Upper Extremity Assessment: Generalized weakness   Lower Extremity Assessment Lower Extremity Assessment: Defer to PT evaluation       Communication Communication Communication: Impaired Factors Affecting Communication: Reduced clarity of speech (pt with slurred words when he initially woke up to participat ein therapy, improved after mobility attempts)   Cognition Arousal: Alert Behavior During Therapy: Flat affect, WFL for tasks assessed/performed Cognition: No family/caregiver present to determine baseline, Cognition impaired     Awareness: Online awareness impaired Memory impairment (select all impairments): Non-declarative long-term memory Attention impairment (select first level of impairment): Sustained attention Executive functioning impairment (select all impairments): Reasoning, Problem solving                   Following commands: Impaired Following commands impaired: Follows one step commands with increased time     Cueing  General Comments   Cueing Techniques: Verbal cues;Tactile cues;Visual cues  vss, BLE wrapped in ace wraps   Exercises Other Exercises Other Exercises: edu re role of OT, role of rehab, discharge recommendations   Shoulder Instructions      Home Living Family/patient expects to be discharged to:: Private residence   Available Help at Discharge: Family               Bathroom Shower/Tub: Tub/shower unit;Sponge bathes at baseline         Home Equipment: Standard Walker;Cane - single point;Wheelchair - manual          Prior Functioning/Environment Prior Level of Function : Needs assist             Mobility Comments: over the last month, decrease in overall ability to amb, was using standard walker, spc or manual wheelchair (would push with BUE) ADLs Comments: MOD I for feeding/grooming/dressing/toileting tasks per pt report; unable to get  into shower recently so sponge bathing,  pt reports increasing weakness over the last month so he required assist for IADLs but was managing meds on his own    OT Problem List: Decreased strength;Decreased activity tolerance;Decreased cognition;Decreased knowledge of use of DME or AE;Impaired balance (sitting and/or standing);Decreased safety awareness   OT Treatment/Interventions: Self-care/ADL training;Energy conservation;Balance training;Therapeutic exercise;DME and/or AE instruction;Therapeutic activities;Patient/family education      OT Goals(Current goals can be found in the care plan section)   Acute Rehab OT Goals Patient Stated Goal: improve function OT Goal Formulation: With patient Time For Goal Achievement: 05/04/24 Potential to Achieve Goals: Good ADL Goals Pt Will Perform Grooming: with set-up;sitting Pt Will Perform Lower Body Dressing: with min assist;sitting/lateral leans Pt Will Transfer to Toilet: with mod assist;with transfer board Pt Will Perform Toileting - Clothing Manipulation and hygiene: with min assist;sitting/lateral leans   OT Frequency:  Min 2X/week    Co-evaluation PT/OT/SLP Co-Evaluation/Treatment: Yes Reason for Co-Treatment: To address functional/ADL transfers;Complexity of the patient's impairments (multi-system involvement);For patient/therapist safety   OT goals addressed during session: ADL's and self-care      AM-PAC OT 6 Clicks Daily Activity     Outcome Measure Help from another person eating meals?: None Help from another person  taking care of personal grooming?: None Help from another person toileting, which includes using toliet, bedpan, or urinal?: A Lot Help from another person bathing (including washing, rinsing, drying)?: A Lot Help from another person to put on and taking off regular upper body clothing?: A Little Help from another person to put on and taking off regular lower body clothing?: A Lot 6 Click Score: 17   End of  Session Nurse Communication: Mobility status  Activity Tolerance: Patient tolerated treatment well Patient left: in bed;with call bell/phone within reach;with bed alarm set  OT Visit Diagnosis: Other abnormalities of gait and mobility (R26.89);Muscle weakness (generalized) (M62.81)                Time: 8891-8872 OT Time Calculation (min): 19 min Charges:  OT General Charges $OT Visit: 1 Visit OT Evaluation $OT Eval Moderate Complexity: 1 Mod Therisa Sheffield, OTD OTR/L  04/20/24, 12:23 PM

## 2024-04-20 NOTE — Progress Notes (Addendum)
 [facility]  ROUNDING NOTE   Subjective:   Patient drowsy during visit Received IV pain medication at 0223    Creatinine 0.9  Objective:  Vital signs in last 24 hours:  Temp:  [97.5 F (36.4 C)-99.4 F (37.4 C)] 98 F (36.7 C) (09/16 1150) Pulse Rate:  [90-121] 103 (09/16 1150) Resp:  [14-18] 16 (09/16 1150) BP: (112-134)/(84-95) 133/94 (09/16 1150) SpO2:  [95 %-100 %] 100 % (09/16 1150) Weight:  [41.2 kg] 41.2 kg (09/16 0500)  Weight change: -1 kg Filed Weights   04/18/24 0500 04/19/24 0500 04/20/24 0500  Weight: 42.4 kg 42.2 kg 41.2 kg    Intake/Output: I/O last 3 completed shifts: In: 1590 [P.O.:240; I.V.:1300; IV Piggyback:50] Out: 1250 [Urine:950; Blood:300]   Intake/Output this shift:  Total I/O In: 1336.2 [P.O.:240; I.V.:1046.2; IV Piggyback:50] Out: -   Physical Exam: General: NAD  Head: Normocephalic, atraumatic. Moist oral mucosal membranes  Eyes: Anicteric  Lungs:  Clear to auscultation  Heart: Regular rate and rhythm  Abdomen:  Soft, nontender  Extremities:  No peripheral edema.  Neurologic: Awake, alert, conversant  Skin: Warm,dry, no rash       Basic Metabolic Panel: Recent Labs  Lab 04/15/24 2033 04/16/24 1258 04/17/24 0728 04/18/24 0535 04/19/24 0518 04/20/24 0504  NA 130* 134* 137 138  137 137 136  K 3.2* 4.6 3.7 3.2*  3.2* 4.8 4.0  CL 97* 104 107 108  107 108 109  CO2 16* 14* 17* 20*  20* 21* 16*  GLUCOSE 133* 110* 185* 157*  157* 187* 166*  BUN 79* 73* 54* 40*  41* 25* 22  CREATININE 6.44* 5.63* 3.24* 1.69*  1.63* 1.41* 0.85  CALCIUM  7.9* 7.3* 7.5* 7.6*  7.5* 6.7* 6.0*  MG 1.4*  --   --   --   --   --   PHOS  --   --  1.2* 1.0*  --   --     Liver Function Tests: Recent Labs  Lab 04/15/24 2033 04/17/24 0728 04/18/24 0535 04/20/24 0504  AST 29  --   --  34  ALT 15  --   --  25  ALKPHOS 69  --   --  58  BILITOT 1.0  --   --  1.7*  PROT 7.9  --   --  6.5  ALBUMIN 3.5 2.9* 3.0* 2.7*   No results  for input(s): LIPASE, AMYLASE in the last 168 hours. No results for input(s): AMMONIA in the last 168 hours.  CBC: Recent Labs  Lab 04/15/24 2033 04/16/24 1258 04/16/24 1626 04/18/24 0535 04/18/24 0833 04/18/24 1152 04/19/24 0518 04/20/24 0504  WBC 9.0 9.3  --  10.8*  --   --  11.1* 16.3*  NEUTROABS  --  7.0  --  8.0*  --   --  8.1* 13.3*  HGB 5.4* 11.6*   < > 12.3* 12.6* 12.5* 12.3* 11.6*  HCT 17.3* 35.4*  --  36.2*  --   --  38.1* 35.2*  MCV 79.0* 85.1  --  83.2  --   --  86.8 86.7  PLT 465* 289  --  275  --   --  250 207   < > = values in this interval not displayed.    Cardiac Enzymes: No results for input(s): CKTOTAL, CKMB, CKMBINDEX, TROPONINI in the last 168 hours.  BNP: Invalid input(s): POCBNP  CBG: Recent Labs  Lab 04/16/24 1209 04/17/24 0425 04/18/24 0605 04/19/24 1025  GLUCAP 81 184*  166* 148*    Microbiology: Results for orders placed or performed during the hospital encounter of 04/15/24  Aerobic/Anaerobic Culture w Gram Stain (surgical/deep wound)     Status: None (Preliminary result)   Collection Time: 04/19/24  9:28 AM   Specimen: Bone; Tissue  Result Value Ref Range Status   Specimen Description   Final    BONE Performed at [facility], [address] [place], [place] [address]    Special Requests   Final    BONE CULTURE RIGHT GREAT TOE Performed at [facility], [address] [place], [place] [address]    Gram Stain NO WBC SEEN NO ORGANISMS SEEN   Final   Culture   Final    NO GROWTH < 24 HOURS Performed at [facility], [address], [place], [place] [address]    Report Status PENDING  Incomplete  Aerobic/Anaerobic Culture w Gram Stain (surgical/deep wound)     Status: None (Preliminary result)   Collection Time: 04/19/24  9:37 AM   Specimen: Wound; Tissue  Result Value Ref Range Status   Specimen Description TISSUE  Final   Special Requests ULCERATION LEFT FOOT  Final   Gram Stain  NO WBC SEEN RARE GRAM POSITIVE COCCI   Final   Culture   Final    NO GROWTH < 24 HOURS Performed at [facility], [address], [place], [place] [address]    Report Status PENDING  Incomplete  Aerobic/Anaerobic Culture w Gram Stain (surgical/deep wound)     Status: None (Preliminary result)   Collection Time: 04/19/24  9:53 AM   Specimen: Bone; Tissue  Result Value Ref Range Status   Specimen Description   Final    BONE Performed at [facility], [address], [place], [place] [address]    Special Requests LEFT 4TH PROXIMAL PHALANX  Final   Gram Stain NO WBC SEEN NO ORGANISMS SEEN   Final   Culture   Final    NO GROWTH < 24 HOURS Performed at [facility], [address] [place], [place] [address]    Report Status PENDING  Incomplete    Coagulation Studies: No results for input(s): LABPROT, INR in the last 72 hours.   Urinalysis: No results for input(s): COLORURINE, LABSPEC, PHURINE, GLUCOSEU, HGBUR, BILIRUBINUR, KETONESUR, PROTEINUR, UROBILINOGEN, NITRITE, LEUKOCYTESUR in the last 72 hours.  Invalid input(s): APPERANCEUR     Imaging: DG Foot 2 Views Left Result Date: 04/19/2024 CLINICAL DATA:  [id number] Post-operative state [id number] EXAM: LEFT FOOT - 2 VIEW COMPARISON:  04/18/2024 FINDINGS: Diffuse osteopenia. Postsurgical changes consistent with transmetatarsal amputation of the foot.Subtle fragmentation of the distal aspects of the fourth and fifth digits. Peripheral vascular atherosclerosis. No subcutaneous gas. Surgical skin staples. IMPRESSION: Expected postsurgical changes related to recent transmetatarsal amputation of the left foot. No radiopaque foreign body or retained surgical instrument. Electronically Signed   By: Rogelia Myers M.D.   On: 04/19/2024 13:35   DG Foot 2 Views Right Result Date: 04/19/2024 CLINICAL DATA:  Bilateral transmetatarsal amputation EXAM: RIGHT FOOT - 2 VIEW COMPARISON:  Right foot  radiograph dated 04/17/2024 FINDINGS: Status post transmetatarsal amputation of the right foot. Surgical staples and a few foci of subcutaneous emphysema along the amputation site. Diffuse osteopenia and vascular calcifications. IMPRESSION: Status post transmetatarsal amputation of the right foot. Electronically Signed   By: Limin  Xu M.D.   On: 04/19/2024 13:33   CT FOOT RIGHT WO CONTRAST Result Date: 04/19/2024  CLINICAL DATA:  Chronic foot changes and fungal dystrophy of the nails of the bilateral feet. EXAM: CT OF THE RIGHT FOOT WITHOUT CONTRAST TECHNIQUE: Multidetector CT imaging of the right foot was performed according to the standard protocol. Multiplanar CT image reconstructions were also generated. RADIATION DOSE REDUCTION: This exam was performed according to the departmental dose-optimization program which includes automated exposure control, adjustment of the mA and/or kV according to patient size and/or use of iterative reconstruction technique. COMPARISON:  Right foot radiographs dated 04/17/2024. FINDINGS: Bones/Joint/Cartilage Severely enlarged and thickened toenails. Soft tissue defect/ulceration along the medial plantar mid great toe extending to the underlying interphalangeal joint of the great toe with associated erosive changes at the base of the distal phalanx and the head of the proximal phalanx of the great toe, concerning for osteomyelitis. Chronic appearing erosive changes at the level of the second dorsal interphalangeal joint, likely secondary to severe chronic overlying nail thickening. The fourth middle phalanx is not clearly visualized with evidence of severe nail thickening and enlargement at this level contributing to chronic remodeling/erosive changes of the fourth distal phalanx. Similarly the fifth distal and middle phalanges are not clearly visualized with severe nail thickening and enlargement at this level and erosive changes at the underlying distal fifth proximal phalanx.  Plantar soft tissue ulceration overlying the base of the fifth metatarsal without evidence of acute osteolysis or erosive changes of the fifth metatarsal base. Diffusely decreased osseous mineralization. No evidence of acute fracture. Ligaments Suboptimally assessed by CT. Muscles and Tendons No appreciable acute abnormality. Soft tissues Soft tissue changes as described above. No loculated fluid collection. IMPRESSION: 1. Severely enlarged and thickened toenails. Soft tissue defect/ulceration along the medial plantar mid great toe extending to the underlying interphalangeal joint with erosive changes of the base of the distal phalanx and the head of the proximal phalanx of the great toe, concerning for osteomyelitis. 2. Chronic appearing erosive changes at the level of the second dorsal interphalangeal joint, likely secondary to severe chronic overlying nail thickening. The fourth middle phalanx is not clearly visualized with evidence of severe nail thickening and enlargement at this level contributing to chronic remodeling/erosive changes of the fourth distal phalanx. Similarly the fifth distal and middle phalanges are not clearly visualized with severe nail thickening and enlargement at this level and erosive changes at the underlying distal fifth proximal phalanx. 3. Plantar soft tissue ulceration overlying the base of the fifth metatarsal without evidence of acute osteolysis or erosive changes of the fifth metatarsal base. Electronically Signed   By: Harrietta Sherry M.D.   On: 04/19/2024 09:17   CT FOOT LEFT WO CONTRAST Result Date: 04/19/2024 CLINICAL DATA:  Chronic foot changes and fungal dystrophy of the nails of the bilateral feet. EXAM: CT OF THE LEFT FOOT WITHOUT CONTRAST TECHNIQUE: Multidetector CT imaging of the left foot was performed according to the standard protocol. Multiplanar CT image reconstructions were also generated. RADIATION DOSE REDUCTION: This exam was performed according to the  departmental dose-optimization program which includes automated exposure control, adjustment of the mA and/or kV according to patient size and/or use of iterative reconstruction technique. COMPARISON:  Left foot radiographs dated 04/17/2024. FINDINGS: Bones/Joint/Cartilage Severely enlarged and thickened toenails. Soft tissue defect/ulceration along the plantar aspect of the mid great toe tracts to the underlying interphalangeal joint with extensive erosive changes of the base of the distal phalanx of the great toe with areas of osteolysis, suspicious for osteomyelitis. Cortical irregularity of the head of the distal phalanx of the  great toe may reflect sequela of osteomyelitis. Erosive changes of the distal phalanx of the great toe are also noted. Erosive changes of the tip of the fourth distal phalanx. Plantar soft tissue ulceration overlying the fourth metatarsal head without evidence of acute osteolysis or erosive changes of the fourth metatarsal head. Diffusely decreased osseous mineralization. No evidence of acute fracture. Ligaments Suboptimally assessed by CT. Muscles and Tendons No appreciable acute abnormality. Soft tissues Soft tissue changes as described above. No loculated fluid collection. IMPRESSION: 1. Severely enlarged and thickened toenails. Soft tissue defect/ulceration along the plantar aspect of the mid great toe tracts to the underlying interphalangeal joint with extensive erosive changes of the base of the distal phalanx of the great toe, suspicious for osteomyelitis. Cortical irregularity of the head of the distal phalanx of the great toe may reflect sequela of osteomyelitis. 2. Erosive changes of the tip of the fourth distal phalanx, osteomyelitis is not excluded. 3. Plantar soft tissue ulceration overlying the fourth metatarsal head without evidence of acute osteolysis or erosive changes of the fourth metatarsal head. Electronically Signed   By: Harrietta Sherry M.D.   On: 04/19/2024 09:03      Medications:    sodium chloride  50 mL/hr at 04/20/24 1200    (feeding supplement) PROSource Plus  30 mL Oral BID BM   amoxicillin -clavulanate  1 tablet Oral Q12H   vitamin C   500 mg Oral BID   blood pressure control book   Does not apply Once   feeding supplement  1 Container Oral TID BM   folic acid   1 mg Oral Daily   multivitamin with minerals  1 tablet Oral Daily   nicotine   21 mg Transdermal Daily   pantoprazole  (PROTONIX ) IV  40 mg Intravenous Q12H   sodium chloride  flush  3 mL Intravenous Q12H   thiamine   100 mg Oral Daily   Or   thiamine   100 mg Intravenous Daily   zinc  sulfate (50mg  elemental zinc )  220 mg Oral Daily   acetaminophen  **OR** acetaminophen , morphine  injection, ondansetron  **OR** ondansetron  (ZOFRAN ) IV, mouth rinse, oxyCODONE -acetaminophen   Assessment/ Plan:  Mr. Bernard Harris is a 63 y.o.  male  with past medical conditions including tobacco and nicotine  abuse, CAD, hypertension, COPD, who was admitted to [facility] on 04/15/2024 for Hypokalemia [E87.6] Hypomagnesemia [E83.42] Acute blood loss anemia [D62] Cachectic (HCC) [R64] Diverticulosis [K57.90] Gallstones [K80.20] Upper GI bleed [K92.2] Elevated troponin [R79.89] Symptomatic anemia [D64.9] Acute renal failure, unspecified acute renal failure type (HCC) [N17.9] Alcohol use disorder [F10.90]   Acute kidney injury secondary to excessive NSAID use. Normal function noted in April of this year. Creatinine 6.4 on ED arrival. Patient reports 1400 mg of ibuprofen twice daily for at least 3 weeks. CT abdomen chest pelvis negative for obstruction. Renal US  unremarkable.  Creatinine has returned to baseline. Will defer further IVF to primary team.   Lab Results  Component Value Date   CREATININE 0.85 04/20/2024   CREATININE 1.41 (H) 04/19/2024   CREATININE 1.69 (H) 04/18/2024   CREATININE 1.63 (H) 04/18/2024    Intake/Output Summary (Last 24 hours) at 04/20/2024 1340 Last data filed at  04/20/2024 1200 Gross per 24 hour  Intake 1336.21 ml  Output 300 ml  Net 1036.21 ml   2.  Hypokalemia/hyponatremia/hypomagnesia likely secondary to kidney injury. Electrolytes have corrected with supplementation and kidney injury.   3. Hypocalcemia, S calcium  6.0. Corrected calcium  7.0. Primary team has ordered IV calcium  gluconate 1g.    4. Anemia due  to acute blood loss, recent NSAID use.  Hemoglobin 5.4 on admission.  GI performed EGD today, no intervention required. Hgb stable, 11.6   5.  Chronic systolic heart failure, echo completed today shows EF 40 to 45% with a mild to moderate MVR and mild AVR.  Due to renal recovery, we will sign off at this time.    LOS: 4 Bernard Harris 9/16/20251:40 PM

## 2024-04-20 NOTE — Care Management Important Message (Signed)
 Important Message  Patient Details  Name: Bernard Harris MRN: [id number] Date of Birth: 12-01-1960   Important Message Given:  Yes - Medicare IM     [name] 04/20/2024, 4:50 PM

## 2024-04-21 DIAGNOSIS — K921 Melena: Secondary | ICD-10-CM | POA: Diagnosis not present

## 2024-04-21 LAB — BASIC METABOLIC PANEL WITH GFR
Anion gap: 10 (ref 5–15)
Anion gap: 14 (ref 5–15)
BUN: 16 mg/dL (ref 8–23)
BUN: 12 mg/dL (ref 8–23)
CO2: 18 mmol/L — ABNORMAL LOW (ref 22–32)
CO2: 16 mmol/L — ABNORMAL LOW (ref 22–32)
Calcium: 5.9 mg/dL — CL (ref 8.9–10.3)
Calcium: 6.2 mg/dL — CL (ref 8.9–10.3)
Chloride: 110 mmol/L (ref 98–111)
Chloride: 108 mmol/L (ref 98–111)
Creatinine, Ser: 0.87 mg/dL (ref 0.61–1.24)
Creatinine, Ser: 0.87 mg/dL (ref 0.61–1.24)
GFR, Estimated: >60 mL/min (ref >60)
GFR, Estimated: >60 mL/min (ref >60)
Glucose, Bld: 124 mg/dL — ABNORMAL HIGH (ref 70–99)
Glucose, Bld: 122 mg/dL — ABNORMAL HIGH (ref 70–99)
Potassium: 3.2 mmol/L — ABNORMAL LOW (ref 3.5–5.1)
Potassium: 3.6 mmol/L (ref 3.5–5.1)
Sodium: 138 mmol/L (ref 135–145)
Sodium: 138 mmol/L (ref 135–145)

## 2024-04-21 LAB — CBC WITH DIFFERENTIAL/PLATELET
Abs Immature Granulocytes: 0.1 K/uL — ABNORMAL HIGH (ref 0.00–0.07)
Basophils Absolute: 0.1 K/uL (ref 0.0–0.1)
Basophils Relative: 1 %
Eosinophils Absolute: 0.1 K/uL (ref 0.0–0.5)
Eosinophils Relative: 1 %
HCT: 31.7 % — ABNORMAL LOW (ref 39.0–52.0)
Hemoglobin: 10.5 g/dL — ABNORMAL LOW (ref 13.0–17.0)
Immature Granulocytes: 1 %
Lymphocytes Relative: 10 %
Lymphs Abs: 1.5 K/uL (ref 0.7–4.0)
MCH: 28.5 pg (ref 26.0–34.0)
MCHC: 33.1 g/dL (ref 30.0–36.0)
MCV: 85.9 fL (ref 80.0–100.0)
Monocytes Absolute: 1.1 K/uL — ABNORMAL HIGH (ref 0.1–1.0)
Monocytes Relative: 8 %
Neutro Abs: 11.7 K/uL — ABNORMAL HIGH (ref 1.7–7.7)
Neutrophils Relative %: 79 %
Platelets: 151 K/uL (ref 150–400)
RBC: 3.69 MIL/uL — ABNORMAL LOW (ref 4.22–5.81)
RDW: 18.6 % — ABNORMAL HIGH (ref 11.5–15.5)
WBC: 14.5 K/uL — ABNORMAL HIGH (ref 4.0–10.5)
nRBC: 0 % (ref 0.0–0.2)

## 2024-04-21 LAB — PHOSPHORUS
Phosphorus: 1.8 mg/dL — ABNORMAL LOW (ref 2.5–4.6)
Phosphorus: 4.1 mg/dL (ref 2.5–4.6)

## 2024-04-21 LAB — MAGNESIUM
Magnesium: 0.8 mg/dL — CL (ref 1.7–2.4)
Magnesium: 2.1 mg/dL (ref 1.7–2.4)

## 2024-04-21 LAB — GLUCOSE, CAPILLARY: Glucose-Capillary: 126 mg/dL — ABNORMAL HIGH (ref 70–99)

## 2024-04-21 MED ORDER — MAGNESIUM SULFATE 4 GM/100ML IV SOLN
4.0000 g | Freq: Once | INTRAVENOUS | Status: AC
  Administered 2024-04-21: 4 g via INTRAVENOUS
  Filled 2024-04-21: qty 100

## 2024-04-21 MED ORDER — POTASSIUM PHOSPHATES 15 MMOLE/5ML IV SOLN
30.0000 mmol | Freq: Once | INTRAVENOUS | Status: AC
  Administered 2024-04-21: 30 mmol via INTRAVENOUS
  Filled 2024-04-21: qty 10

## 2024-04-21 MED ORDER — CALCIUM GLUCONATE-NACL 1-0.675 GM/50ML-% IV SOLN
1.0000 g | Freq: Once | INTRAVENOUS | Status: AC
  Administered 2024-04-21: 1000 mg via INTRAVENOUS
  Filled 2024-04-21: qty 50

## 2024-04-21 MED ORDER — MAGNESIUM SULFATE 2 GM/50ML IV SOLN
2.0000 g | Freq: Once | INTRAVENOUS | Status: DC
  Filled 2024-04-21: qty 50

## 2024-04-21 MED ORDER — CALCIUM GLUCONATE-NACL 2-0.675 GM/100ML-% IV SOLN
2.0000 g | Freq: Once | INTRAVENOUS | Status: AC
  Administered 2024-04-21: 2000 mg via INTRAVENOUS
  Filled 2024-04-21: qty 100

## 2024-04-21 NOTE — TOC Progression Note (Signed)
 Transition of Care [facility]) - Progression Note    Patient Details  Name: Bernard Harris MRN: [id number] Date of Birth: [birth year]  Transition of Care [facility]) CM/SW Contact  Lauraine JAYSON Carpen, LCSW Phone Number: 04/21/2024, 2:10 PM  Clinical Narrative:   Patient is agreeable to SNF placement. No facility preference. Will follow up with bed offers once available.  Expected Discharge Plan and Services                                                Social Drivers of Health (SDOH) Interventions SDOH Screenings   Food Insecurity: Food Insecurity Present (04/17/2024)  Housing: Low Risk  (04/17/2024)  Transportation Needs: No Transportation Needs (04/17/2024)  Utilities: Not At Risk (04/17/2024)  Financial Resource Strain: Patient Declined (11/04/2023)   Received from [facility]  Social Connections: Moderately Integrated (04/17/2024)  Tobacco Use: High Risk (04/19/2024)    Readmission Risk Interventions     No data to display

## 2024-04-21 NOTE — Progress Notes (Signed)
  Subjective:  Patient ID: Bernard Harris, male    DOB: Jan 29, 1961,  MRN: [id number]  Chief Complaint  Patient presents with   Weakness    DOS: 04/19/24 Procedure: Bilateral trans metatarsal amputation   63 y.o. male seen for post op check. He reports no pain in left foot, right foot is tender to touch. Aware of plans for rehab, no weight for a few weeks on either foot. Discussed follow up.   Review of Systems: Negative except as noted in the HPI. Denies N/V/F/Ch.   Objective:   Constitutional Well developed. Well nourished.  Vascular Foot warm and well perfused. Capillary refill normal to all digits.   No calf pain with palpation  Neurologic Normal speech. Oriented to person, place, and time. Epicritic sensation intact  Dermatologic Bilateral foot TMA sites well coapted no drainage or dehiscence no evidence necrosis or infection      Orthopedic: S/p TMA bilateral foot   Radiographs: Expected postsurgical changes related to recent transmetatarsal amputation of the left foot. No radiopaque foreign body or retained surgical instrument.  Status post transmetatarsal amputation of the right foot.   Pathology: pending  Micro: ulceration left foot  cx - few gpc, pending  Assessment:   1. Symptomatic anemia   2. Upper GI bleed   3. Acute renal failure, unspecified acute renal failure type (HCC)   4. Alcohol use disorder   5. Hypokalemia   6. Hypomagnesemia   7. Cachectic (HCC)   8. Elevated troponin   9. Gallstones   10. Diverticulosis    Osteomyelitis of multiple toes, bilateral foot S/p Bilateral foot TMA  Plan:  Patient was evaluated and treated and all questions answered.  POD # 2 s/p Bilateral foot TMA -Progressing as expected post op, amputation sites healing well at this time. No necrosis, expect clean margin bilaterally - Appreciate RD recs, supplement diet with high protein as much as able to tolerate -XR: expected post op changes -WB Status: NWB  in soft dressing bilaterally. Strict NWB x 2-3 weeks, will determine if he can be advanced to WB in post op shoe at first follow up apt -Sutures: remain intact 2-3 weeks. -Medications/ABX: augmentin  x 5 days post op then no further abx indicated  -Dressing: changed today, will add orders for every 3 days dsg change bilateral foot for her and continue at rehab - F/u Plan: follow up in office in 2 weeks, office to call to arrange. Stable for dc from my standpoint to SNF when arranged, will sign off.         Bernard Harris, DPM [facility] / [facility]

## 2024-04-21 NOTE — Consult Note (Signed)
 PHARMACY CONSULT NOTE - ELECTROLYTES  Pharmacy Consult for Electrolyte Monitoring and Replacement   Recent Labs: Height: 5' 9 (175.3 cm) Weight: 40.5 kg (89 lb 4.6 oz) IBW/kg (Calculated) : 70.7 Estimated Creatinine Clearance: 48.5 mL/min (by C-G formula based on SCr of 0.87 mg/dL). Potassium (mmol/L)  Date Value  04/21/2024 3.2 (L)   Magnesium  (mg/dL)  Date Value  90/82/7974 0.8 (LL)   Calcium  (mg/dL)  Date Value  90/82/7974 5.9 (LL)   Albumin (g/dL)  Date Value  90/83/7974 2.7 (L)   Phosphorus (mg/dL)  Date Value  90/82/7974 1.8 (L)   Sodium (mmol/L)  Date Value  04/21/2024 138   Corrected Ca: 7.26 mg/dL  Assessment  Bernard Harris is a 63 y.o. male presenting with melena and acute blood loss. PMH significant for COPD, tobacco use disorder, COPD, HTN, CAD with history of stent 2011. Noted patient with cachexia and severe protein caloric malnutrition. Pharmacy has been consulted to monitor and replace electrolytes.  Diet: regula diet MIVF: NS @ 50 mL/hr  Goal of Therapy: Electrolytes WNL  Plan:  Mg critical low at 0.8, will replace with MgSulfate 4gm IV x 1 dose now, then MgSulfate 4gm in PM. Corrected calcium  = 7.26, will order Calcium  gluconate 1gm IV x 1 today K=3.2 with Phos 1.8, will replace with Kphos 30 mmol today as well. Check renal function panel and Mg with AM labs  Thank you for allowing pharmacy to be a part of this patient's care.  Diana Armijo Rodriguez-Guzman PharmD, BCPS 04/21/2024 7:43 AM

## 2024-04-21 NOTE — NC FL2 (Signed)
 [place]  MEDICAID FL2 LEVEL OF CARE FORM     IDENTIFICATION  Patient Name: Bernard Harris Birthdate: Feb 21, 1961 Sex: male Admission Date (Current Location): 04/15/2024  [facility] and [place] Number:  Chiropodist and Address:  [facility], [address] [place], [place] [address]      Provider Number: [id number]  Attending Physician Name and Address:  Jerelene Critchley, MD  Relative Name and Phone Number:       Current Level of Care: Hospital Recommended Level of Care: Skilled Nursing Facility Prior Approval Number:    Date Approved/Denied:   PASRR Number: [id number]  Discharge Plan: SNF    Current Diagnoses: Patient Active Problem List   Diagnosis Date Noted   Protein-calorie malnutrition, severe 04/20/2024   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) 04/19/2024   Acute posthemorrhagic anemia 04/19/2024   Neglect of personal hygiene 04/17/2024   Gangrene of right foot (HCC) 04/17/2024   Acute blood loss anemia 04/16/2024   Melena 04/16/2024   CAD S/P percutaneous coronary angioplasty 04/16/2024   AKI (acute kidney injury) (HCC) 04/16/2024   Cachexia (HCC) 04/16/2024   NSAID induced gastritis 04/16/2024   Electrolyte abnormality 04/16/2024   Elevated troponin 04/16/2024   Hyperlipidemia 09/30/2017   Cyst of skin 08/20/2017   Alcohol use disorder 10/20/2013   HTN (hypertension) 10/20/2013   Tobacco use disorder 10/20/2013   Status post placement of bare metal coronary artery stent 10/16/2009   Heart attack (HCC) 10/15/2009    Orientation RESPIRATION BLADDER Height & Weight     Self, Time, Situation, Place  Normal Continent, External catheter Weight: 89 lb 4.6 oz (40.5 kg) Height:  5' 9 (175.3 cm)  BEHAVIORAL SYMPTOMS/MOOD NEUROLOGICAL BOWEL NUTRITION STATUS   (None)  (None) Incontinent Diet (Regular)  AMBULATORY STATUS COMMUNICATION OF NEEDS Skin   Extensive Assist Verbally Other (Comment), Surgical  wounds (Erythema/redness. Incisions on both feet: gauze, compression wrap, impregnated gauze (bismuth).)                       Personal Care Assistance Level of Assistance  Bathing, Feeding, Dressing Bathing Assistance: Maximum assistance Feeding assistance: Limited assistance Dressing Assistance: Maximum assistance     Functional Limitations Info  Sight, Hearing, Speech Sight Info: Adequate Hearing Info: Adequate Speech Info: Adequate    SPECIAL CARE FACTORS FREQUENCY  PT (By licensed PT), OT (By licensed OT)     PT Frequency: 5 x week OT Frequency: 5 x week            Contractures Contractures Info: Not present    Additional Factors Info  Code Status, Allergies Code Status Info: Full code Allergies Info: NKDA           Current Medications (04/21/2024):  This is the current hospital active medication list Current Facility-Administered Medications  Medication Dose Route Frequency Provider Last Rate Last Admin   (feeding supplement) PROSource Plus liquid 30 mL  30 mL Oral BID BM Djan, Prince T, MD   30 mL at 04/21/24 1011   0.9 %  sodium chloride  infusion   Intravenous Continuous Jinny Carmine, MD 50 mL/hr at 04/21/24 1334 Infusion Verify at 04/21/24 1334   acetaminophen  (TYLENOL ) tablet 650 mg  650 mg Oral Q6H PRN Jinny Carmine, MD   650 mg at 04/19/24 1826   Or   acetaminophen  (TYLENOL ) suppository 650 mg  650 mg Rectal Q6H PRN Jinny Carmine, MD       amoxicillin -clavulanate (AUGMENTIN ) 875-125 MG per  tablet 1 tablet  1 tablet Oral Q12H Malvin Marsa FALCON, DPM   1 tablet at 04/21/24 1011   ascorbic acid  (VITAMIN C ) tablet 500 mg  500 mg Oral BID Dorinda Homans T, MD   500 mg at 04/21/24 1010   blood pressure control book   Does not apply Once Jinny Carmine, MD       calcium -vitamin D (OSCAL WITH D) 500-5 MG-MCG per tablet 2 tablet  2 tablet Oral BID Dennise Capri, MD   2 tablet at 04/21/24 1011   feeding supplement (BOOST / RESOURCE BREEZE) liquid 1 Container  1  Container Oral TID BM Dorinda Homans DASEN, MD   1 Container at 04/21/24 1012   folic acid  (FOLVITE ) tablet 1 mg  1 mg Oral Daily Wohl, Darren, MD   1 mg at 04/21/24 1010   magnesium  sulfate IVPB 4 g 100 mL  4 g Intravenous Once Ponnala, Shruthi, MD       morphine  (PF) 2 MG/ML injection 2 mg  2 mg Intravenous Q4H PRN Dorinda Homans DASEN, MD   2 mg at 04/20/24 0223   multivitamin with minerals tablet 1 tablet  1 tablet Oral Daily Wohl, Darren, MD   1 tablet at 04/21/24 1010   nicotine  (NICODERM CQ  - dosed in mg/24 hours) patch 21 mg  21 mg Transdermal Daily Jinny Carmine, MD   21 mg at 04/21/24 1011   ondansetron  (ZOFRAN ) tablet 4 mg  4 mg Oral Q6H PRN Jinny Carmine, MD       Or   ondansetron  (ZOFRAN ) injection 4 mg  4 mg Intravenous Q6H PRN Jinny Carmine, MD       Oral care mouth rinse  15 mL Mouth Rinse PRN Jinny Carmine, MD       oxyCODONE -acetaminophen  (PERCOCET/ROXICET) 5-325 MG per tablet 1 tablet  1 tablet Oral Q4H PRN Dorinda Homans DASEN, MD   1 tablet at 04/20/24 1446   pantoprazole  (PROTONIX ) injection 40 mg  40 mg Intravenous Q12H Jinny Carmine, MD   40 mg at 04/21/24 1012   potassium PHOSPHATE  30 mmol in dextrose  5 % 500 mL infusion  30 mmol Intravenous Once Ponnala, Shruthi, MD 85 mL/hr at 04/21/24 1334 Infusion Verify at 04/21/24 1334   sodium chloride  flush (NS) 0.9 % injection 3 mL  3 mL Intravenous Q12H Jinny Carmine, MD   3 mL at 04/20/24 2203   thiamine  (VITAMIN B1) tablet 100 mg  100 mg Oral Daily Jinny Carmine, MD   100 mg at 04/21/24 1011   Or   thiamine  (VITAMIN B1) injection 100 mg  100 mg Intravenous Daily Jinny Carmine, MD   100 mg at 04/20/24 9070   zinc  sulfate (50mg  elemental zinc ) capsule 220 mg  220 mg Oral Daily Djan, Prince T, MD   220 mg at 04/21/24 1010     Discharge Medications: Please see discharge summary for a list of discharge medications.  Relevant Imaging Results:  Relevant Lab Results:   Additional Information SS#: [id number]  Lauraine JAYSON Carpen, LCSW

## 2024-04-21 NOTE — Plan of Care (Signed)
 Dressing on feet done today.  Electrolyte replacement and encouraging to eat more.  Pain minimal over shift, only wanted PRN pain meds once today.

## 2024-04-21 NOTE — Progress Notes (Signed)
 Critical labs this AM:  Relayed to Dr. Cleatus  Critical low Calcium : 5.9 (LL)  Critically low Magnesium : 0.8 (LL)   Mag ordered.

## 2024-04-21 NOTE — Progress Notes (Signed)
 Progress Note   Patient: Bernard Harris FMW:[id number] DOB: [birth year] DOA: 04/15/2024     5 DOS: the patient was seen and examined on 04/21/2024      Brief hospital course: From HPI Bernard Harris is a 63 y.o. male with medical history significant for COPD, tobacco use disorder, COPD, HTN, CAD with history of stent 2011, being admitted with melena and acute blood loss anemia(Hb 5.4) as well as severe AKI(creatinine 6.44) believe NSAID related.  Patient presented with 2 weeks of fatigue and worsening dyspnea on exertion.  He has noted black stool for the past week.  Reports he has been having knee pain from a fall 3 weeks ago and has been using ibuprofen up to 7 tablets twice a day based on what a friend told him his doctor prescribed for similar problem. Endorses drinking anywhere from 1-5 drinks of vodka daily but has not done so recently due to the weakness(states half gallon of vodka lasts 2 to 3 weeks) In the ED hemoglobin of 5.4, with most recent of 7.8 back in April 2025. Other hospital course as noted below     Assessment and Plan: Melena NSAID induced gastropathy/alcoholic gastritis Acute on likely chronic blood loss anemia  Hypotension secondary to hypovolemia/acute blood loss On presentation patient had hemoglobin of 5.4, with most recent of 7.8 back in April 2025. S/p 3 units of blood transfusion Continue Protonix  , Octreotide  discontinued Patient underwent EGD by GI which did not show acute bleeding, EGD was also limited as patient had lot of food intake at. Gastroenterologist planning outpatient follow-up with repeat EGD    AKI (acute kidney injury) Anion gap metabolic acidosis Suspect NSAID induced nephropathy Suspect ATN/(?interstitial nephritis) related to NSAIDs in combination with hypovolemia/hypotension from blood loss CT chest abdomen and pelvis with no concerning findings and kidneys/urinary system S/p Iv fluids Renal function currently back to  baseline and nephrologist have signed off   Electrolyte abnormality Hypokalemia, Severe hypomagnesemia Hypophosphatemia Likely related to hypovolemia, hypotension, alcohol use Monitor and replete as needed  Hypocalcemia - Corrected calcium  6.9, give 1gm IV Calcium  gluconate - asymptomatic   CAD S/P percutaneous coronary angioplasty Elevated troponin Troponin elevated in the 200s but downtrending, likely supply/demand mismatch Will hold aspirin due to bleeding Hold metoprolol, lisinopril, nitro due to hypotension Echocardiogram showing EF 40 to 45% with normal diastolic parameter Previous hospitalist discussed with Dr. Darron and patient follows up with Dr. 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  Constitutional:      General: He is awake. He is not in acute distress.    Appearance: He is cachectic.  HENT:  Head: Normocephalic and atraumatic.  Cardiovascular:     Rate and Rhythm: Normal rate and regular rhythm.     Heart sounds: Normal heart sounds.  Pulmonary:      Effort: Pulmonary effort is normal.     Breath sounds: Normal breath sounds.  Abdominal:     Palpations: Abdomen is soft.     Tenderness: There is no abdominal tenderness.  Neurological:     Mental Status: He is alert. Mental status is at baseline.  Musculoskeletal: Dressing noted to bilateral foot    Data Reviewed:  Vitals:   04/20/24 2044 04/21/24 0023 04/21/24 0357 04/21/24 0521  BP: 120/80 118/78 128/88   Pulse: 80 80 96   Resp: 18 18 18    Temp: 98.6 F (37 C) 98.2 F (36.8 C) 98.1 F (36.7 C)   TempSrc:      SpO2: 100% 100% 100%   Weight:    40.5 kg  Height:        Data Reviewed:    Latest Ref Rng & Units 04/21/2024    4:54 AM 04/20/2024    5:04 AM 04/19/2024    5:18 AM  CBC  WBC 4.0 - 10.5 K/uL 14.5  16.3  11.1   Hemoglobin 13.0 - 17.0 g/dL 89.4  88.3  87.6   Hematocrit 39.0 - 52.0 % 31.7  35.2  38.1   Platelets 150 - 400 K/uL 151  207  250        Latest Ref Rng & Units 04/21/2024    4:54 AM 04/20/2024    5:04 AM 04/19/2024    5:18 AM  BMP  Glucose 70 - 99 mg/dL 875  833  812   BUN 8 - 23 mg/dL 16  22  25    Creatinine 0.61 - 1.24 mg/dL 9.12  9.14  8.58   Sodium 135 - 145 mmol/L 138  136  137   Potassium 3.5 - 5.1 mmol/L 3.2  4.0  4.8   Chloride 98 - 111 mmol/L 110  109  108   CO2 22 - 32 mmol/L 18  16  21    Calcium  8.9 - 10.3 mg/dL 5.9  6.0  6.7       Author: Laree Lock, MD 04/21/2024 8:49 AM  For on call review [email or web address].

## 2024-04-22 DIAGNOSIS — K921 Melena: Secondary | ICD-10-CM | POA: Diagnosis not present

## 2024-04-22 LAB — CBC
HCT: 29.4 % — ABNORMAL LOW (ref 39.0–52.0)
Hemoglobin: 9.8 g/dL — ABNORMAL LOW (ref 13.0–17.0)
MCH: 28.5 pg (ref 26.0–34.0)
MCHC: 33.3 g/dL (ref 30.0–36.0)
MCV: 85.5 fL (ref 80.0–100.0)
Platelets: 150 K/uL (ref 150–400)
RBC: 3.44 MIL/uL — ABNORMAL LOW (ref 4.22–5.81)
RDW: 18.8 % — ABNORMAL HIGH (ref 11.5–15.5)
WBC: 12.3 K/uL — ABNORMAL HIGH (ref 4.0–10.5)
nRBC: 0 % (ref 0.0–0.2)

## 2024-04-22 LAB — RENAL FUNCTION PANEL
Albumin: 2.3 g/dL — ABNORMAL LOW (ref 3.5–5.0)
Anion gap: 6 (ref 5–15)
BUN: 10 mg/dL (ref 8–23)
CO2: 17 mmol/L — ABNORMAL LOW (ref 22–32)
Calcium: 6.4 mg/dL — CL (ref 8.9–10.3)
Chloride: 112 mmol/L — ABNORMAL HIGH (ref 98–111)
Creatinine, Ser: 0.78 mg/dL (ref 0.61–1.24)
GFR, Estimated: >60 mL/min (ref >60)
Glucose, Bld: 161 mg/dL — ABNORMAL HIGH (ref 70–99)
Phosphorus: 2.1 mg/dL — ABNORMAL LOW (ref 2.5–4.6)
Potassium: 3 mmol/L — ABNORMAL LOW (ref 3.5–5.1)
Sodium: 135 mmol/L (ref 135–145)

## 2024-04-22 LAB — SURGICAL PATHOLOGY

## 2024-04-22 LAB — GLUCOSE, CAPILLARY: Glucose-Capillary: 174 mg/dL — ABNORMAL HIGH (ref 70–99)

## 2024-04-22 LAB — MAGNESIUM: Magnesium: 2.4 mg/dL (ref 1.7–2.4)

## 2024-04-22 MED ORDER — CALCIUM GLUCONATE-NACL 2-0.675 GM/100ML-% IV SOLN
2.0000 g | Freq: Once | INTRAVENOUS | Status: DC
  Filled 2024-04-22: qty 100

## 2024-04-22 MED ORDER — BENZONATATE 100 MG PO CAPS
100.0000 mg | ORAL_CAPSULE | Freq: Three times a day (TID) | ORAL | Status: DC | PRN
  Administered 2024-04-24 – 2024-04-25 (×2): 100 mg via ORAL
  Filled 2024-04-22 (×2): qty 1

## 2024-04-22 MED ORDER — POTASSIUM CHLORIDE CRYS ER 20 MEQ PO TBCR
40.0000 meq | EXTENDED_RELEASE_TABLET | ORAL | Status: AC
  Administered 2024-04-22 (×2): 40 meq via ORAL
  Filled 2024-04-22 (×2): qty 2

## 2024-04-22 MED ORDER — POTASSIUM PHOSPHATES 15 MMOLE/5ML IV SOLN
15.0000 mmol | Freq: Once | INTRAVENOUS | Status: AC
  Administered 2024-04-22: 15 mmol via INTRAVENOUS
  Filled 2024-04-22: qty 5

## 2024-04-22 MED ORDER — GUAIFENESIN ER 600 MG PO TB12
600.0000 mg | ORAL_TABLET | Freq: Two times a day (BID) | ORAL | Status: DC
Start: 2024-04-22 — End: 2024-04-22
  Administered 2024-04-22: 600 mg via ORAL
  Filled 2024-04-22: qty 1

## 2024-04-22 MED ORDER — GUAIFENESIN ER 600 MG PO TB12: 600.0000 mg | ORAL_TABLET | Freq: Two times a day (BID) | ORAL | Status: DC

## 2024-04-22 MED ORDER — GUAIFENESIN 100 MG/5ML PO LIQD: 5.0000 mL | ORAL | Status: DC | PRN

## 2024-04-22 MED ORDER — POTASSIUM PHOSPHATES 15 MMOLE/5ML IV SOLN: 30.0000 mmol | Freq: Once | INTRAVENOUS | Status: DC

## 2024-04-22 MED ORDER — GUAIFENESIN-DM 100-10 MG/5ML PO SYRP: 5.0000 mL | ORAL_SOLUTION | ORAL | Status: DC | PRN

## 2024-04-22 MED ORDER — POTASSIUM CHLORIDE CRYS ER 20 MEQ PO TBCR: 40.0000 meq | EXTENDED_RELEASE_TABLET | Freq: Once | ORAL | Status: DC

## 2024-04-22 MED ORDER — PANTOPRAZOLE SODIUM 40 MG PO TBEC
40.0000 mg | DELAYED_RELEASE_TABLET | Freq: Two times a day (BID) | ORAL | Status: DC
  Administered 2024-04-22 – 2024-04-30 (×16): 40 mg via ORAL
  Filled 2024-04-22 (×17): qty 1

## 2024-04-22 NOTE — TOC Progression Note (Addendum)
 Transition of Care [facility]) - Progression Note    Patient Details  Name: Bernard Harris MRN: [id number] Date of Birth: [birth year]  [facility] CM/SW Contact  Lauraine JAYSON Carpen, LCSW Phone Number: 04/22/2024, 10:58 AM  Clinical Narrative: No bed offers this morning. [facility] is considering but does not have a bed. [facility] and [facility] have not responded. Left messages for their admissions coordinators asking them to review. All other local facilities declined.    3:53 pm: Gave bed offers. Patient will review with his sister.  Expected Discharge Plan and Services                                               Social Drivers of Health (SDOH) Interventions SDOH Screenings   Food Insecurity: Food Insecurity Present (04/17/2024)  Housing: Low Risk  (04/17/2024)  Transportation Needs: No Transportation Needs (04/17/2024)  Utilities: Not At Risk (04/17/2024)  Financial Resource Strain: Patient Declined (11/04/2023)   Received from [facility]  Social Connections: Moderately Integrated (04/17/2024)  Tobacco Use: High Risk (04/19/2024)    Readmission Risk Interventions     No data to display

## 2024-04-22 NOTE — Anesthesia Postprocedure Evaluation (Signed)
 Anesthesia Post Note  Patient: Bernard Harris [name]  Procedure(s) Performed: EGD (ESOPHAGOGASTRODUODENOSCOPY)  Patient location during evaluation: Endoscopy Anesthesia Type: General Level of consciousness: awake and alert Pain management: pain level controlled Vital Signs Assessment: post-procedure vital signs reviewed and stable Respiratory status: spontaneous breathing, nonlabored ventilation, respiratory function stable and patient connected to nasal cannula oxygen Cardiovascular status: blood pressure returned to baseline and stable Postop Assessment: no apparent nausea or vomiting Anesthetic complications: no   No notable events documented.   Last Vitals:  Vitals:   04/22/24 0840 04/22/24 1047  BP: 124/77 127/78  Pulse: 85 79  Resp: 18 18  Temp: 37 C 37 C  SpO2: 100% 100%    Last Pain:  Vitals:   04/21/24 2233  TempSrc: Oral  PainSc:                  Prentice Murphy

## 2024-04-22 NOTE — Plan of Care (Signed)
  Problem: Health Behavior/Discharge Planning: Goal: Ability to manage health-related needs will improve Outcome: Progressing   Problem: Clinical Measurements: Goal: Respiratory complications will improve Outcome: Adequate for Discharge Goal: Cardiovascular complication will be avoided Outcome: Adequate for Discharge   Problem: Activity: Goal: Risk for activity intolerance will decrease Outcome: Progressing   Problem: Nutrition: Goal: Adequate nutrition will be maintained Outcome: Progressing

## 2024-04-22 NOTE — Consult Note (Signed)
 PHARMACY CONSULT NOTE - ELECTROLYTES  Pharmacy Consult for Electrolyte Monitoring and Replacement   Recent Labs: Height: 5' 9 (175.3 cm) Weight: 48.3 kg (106 lb 7.7 oz) IBW/kg (Calculated) : 70.7 Estimated Creatinine Clearance: 62.9 mL/min (by C-G formula based on SCr of 0.78 mg/dL). Potassium (mmol/L)  Date Value  04/22/2024 3.0 (L)   Magnesium  (mg/dL)  Date Value  90/81/7974 2.4   Calcium  (mg/dL)  Date Value  90/81/7974 6.4 (LL)   Albumin (g/dL)  Date Value  90/81/7974 2.3 (L)   Phosphorus (mg/dL)  Date Value  90/81/7974 2.1 (L)   Sodium (mmol/L)  Date Value  04/22/2024 135   Corrected Ca: 8.08 mg/dL  Assessment  Bernard Harris is a 63 y.o. male presenting with melena and acute blood loss. PMH significant for COPD, tobacco use disorder, COPD, HTN, CAD with history of stent 2011. Noted patient with cachexia and severe protein caloric malnutrition. Pharmacy has been consulted to monitor and replace electrolytes.  Diet: regular diet MIVF: NS @ 50 mL/hr  Goal of Therapy: Electrolytes WNL  Plan:  K=3.0 today with Phos 2.1. Will order Kphos 15 mmol IV x 1 and KCL po 40meq q 4 hrs x 2 doses (provides 15 mmol of phos and 100 mEq of K total today). Corrected calcium  at 8.1; patient already on schedule oral calcium  (1000mg  BID). No additional replacement at this time. Check renal function panel and Mg with AM labs  Thank you for allowing pharmacy to be a part of this patient's care.  Whitaker Holderman Rodriguez-Guzman PharmD, BCPS 04/22/2024 7:25 AM

## 2024-04-22 NOTE — Progress Notes (Addendum)
 Progress Note   Patient: Bernard Harris FMW:[id number] DOB: 1961/07/22 DOA: 04/15/2024     6 DOS: the patient was seen and examined on 04/22/2024      Brief hospital course: From HPI Bernard Harris is a 63 y.o. male with medical history significant for COPD, tobacco use disorder, COPD, HTN, CAD with history of stent 2011, being admitted with melena and acute blood loss anemia(Hb 5.4) as well as severe AKI(creatinine 6.44) believe NSAID related.  Patient presented with 2 weeks of fatigue and worsening dyspnea on exertion.  He has noted black stool for the past week.  Reports he has been having knee pain from a fall 3 weeks ago and has been using ibuprofen up to 7 tablets twice a day based on what a friend told him his doctor prescribed for similar problem. Endorses drinking anywhere from 1-5 drinks of vodka daily but has not done so recently due to the weakness(states half gallon of vodka lasts 2 to 3 weeks) In the ED hemoglobin of 5.4, with most recent of 7.8 back in April 2025. Other hospital course as noted below     Assessment and Plan: Melena NSAID induced gastropathy/alcoholic gastritis Acute on likely chronic blood loss anemia  Hypotension secondary to hypovolemia/acute blood loss On presentation patient had hemoglobin of 5.4, with most recent of 7.8 back in April 2025. S/p 3 units of blood transfusion Continue Protonix  , Octreotide  discontinued Patient underwent EGD by GI which did not show acute bleeding, EGD was also limited as patient had lot of food intake at. Gastroenterologist planning outpatient follow-up with repeat EGD  Monitor Hb, 9.8 today    AKI (acute kidney injury) Anion gap metabolic acidosis Suspect NSAID induced nephropathy Suspect ATN/(?interstitial nephritis) related to NSAIDs in combination with hypovolemia/hypotension from blood loss CT chest abdomen and pelvis with no concerning findings and kidneys/urinary system S/p Iv fluids Renal  function currently back to baseline and nephrologist have signed off   Electrolyte abnormality Hypokalemia, Severe hypomagnesemia Hypophosphatemia Likely related to hypovolemia, hypotension, alcohol use, possible refeeding syndrome Monitor and replete as needed  Hypocalcemia - Corrected calcium  7.8, on p.o. calcium  supplements - asymptomatic   CAD S/P percutaneous coronary angioplasty Elevated troponin Troponin elevated in the 200s but downtrending, likely supply/demand mismatch Will hold aspirin due to bleeding Hold metoprolol, lisinopril, nitro due to hypotension Echocardiogram showing EF 40 to 45% with normal diastolic parameter Previous hospitalist discussed with Bernard Harris and patient follows up with Bernard Harris team and they recommend outpatient follow-up   Cachexia [facility]) Suspect severe protein calorie malnutrition related to alcohol use disorder BMI 15 Dietitian consulted   Tobacco use disorder Nicotine  patch   Alcohol use disorder Given thiamine  folate and multivitamin in the ED CIWA withdrawal protocol   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  Constitutional:      General: He is awake. He is not  in acute distress.    Appearance: He is cachectic.  HENT:     Head: Normocephalic and atraumatic.  Cardiovascular:     Rate and Rhythm:  Normal rate and regular rhythm.     Heart sounds: Normal heart sounds.  Pulmonary:     Effort: Pulmonary effort is normal.     Breath sounds: Normal breath sounds.  Abdominal:     Palpations: Abdomen is soft.     Tenderness: There is no abdominal tenderness.  Neurological:     Mental Status: He is alert. Mental status is at baseline.  Musculoskeletal: Dressing noted to bilateral foot    Data Reviewed:  Vitals:   04/22/24 0500 04/22/24 0840 04/22/24 1047 04/22/24 1457  BP:  124/77 127/78 118/76  Pulse:  85 79 86  Resp:  18 18 17   Temp:  98.6 F (37 C) 98.6 F (37 C) 99 F (37.2 C)  TempSrc:      SpO2:  100% 100% 100%  Weight: 48.3 kg     Height:        Data Reviewed:    Latest Ref Rng & Units 04/22/2024    4:35 AM 04/21/2024    4:54 AM 04/20/2024    5:04 AM  CBC  WBC 4.0 - 10.5 K/uL 12.3  14.5  16.3   Hemoglobin 13.0 - 17.0 g/dL 9.8  89.4  88.3   Hematocrit 39.0 - 52.0 % 29.4  31.7  35.2   Platelets 150 - 400 K/uL 150  151  207        Latest Ref Rng & Units 04/22/2024    4:38 AM 04/21/2024    5:40 PM 04/21/2024    4:54 AM  BMP  Glucose 70 - 99 mg/dL 838  877  875   BUN 8 - 23 mg/dL 10  12  16    Creatinine 0.61 - 1.24 mg/dL 9.21  9.12  9.12   Sodium 135 - 145 mmol/L 135  138  138   Potassium 3.5 - 5.1 mmol/L 3.0  3.6  3.2   Chloride 98 - 111 mmol/L 112  108  110   CO2 22 - 32 mmol/L 17  16  18    Calcium  8.9 - 10.3 mg/dL 6.4  6.2  5.9       Author: Laree Lock, MD 04/22/2024 4:13 PM  For on call review [email or web address].

## 2024-04-22 NOTE — Progress Notes (Signed)
 PHARMACIST - PHYSICIAN COMMUNICATION  DR:   Jerelene  CONCERNING: IV to Oral Route Change Policy  RECOMMENDATION: This patient is receiving Pantoprazole  by the intravenous route.  Based on criteria approved by the Pharmacy and Therapeutics Committee, the intravenous medication(s) is/are being converted to the equivalent oral dose form(s).   DESCRIPTION: These criteria include: The patient is eating (either orally or via tube) and/or has been taking other orally administered medications for a least 24 hours The patient has no evidence of active gastrointestinal bleeding or impaired GI absorption (gastrectomy, short bowel, patient on TNA or NPO).  Maven Varelas Rodriguez-Guzman PharmD, BCPS 04/22/2024 10:22 AM

## 2024-04-23 DIAGNOSIS — K921 Melena: Secondary | ICD-10-CM | POA: Diagnosis not present

## 2024-04-23 LAB — BASIC METABOLIC PANEL WITH GFR
Anion gap: 5 (ref 5–15)
BUN: 8 mg/dL (ref 8–23)
CO2: 16 mmol/L — ABNORMAL LOW (ref 22–32)
Calcium: 6.7 mg/dL — ABNORMAL LOW (ref 8.9–10.3)
Chloride: 112 mmol/L — ABNORMAL HIGH (ref 98–111)
Creatinine, Ser: 0.85 mg/dL (ref 0.61–1.24)
GFR, Estimated: >60 mL/min (ref >60)
Glucose, Bld: 115 mg/dL — ABNORMAL HIGH (ref 70–99)
Potassium: 4.4 mmol/L (ref 3.5–5.1)
Sodium: 133 mmol/L — ABNORMAL LOW (ref 135–145)

## 2024-04-23 LAB — MAGNESIUM: Magnesium: 1.3 mg/dL — ABNORMAL LOW (ref 1.7–2.4)

## 2024-04-23 LAB — ALBUMIN: Albumin: 2.3 g/dL — ABNORMAL LOW (ref 3.5–5.0)

## 2024-04-23 LAB — PHOSPHORUS: Phosphorus: 1.3 mg/dL — ABNORMAL LOW (ref 2.5–4.6)

## 2024-04-23 LAB — HEMOGLOBIN: Hemoglobin: 9.1 g/dL — ABNORMAL LOW (ref 13.0–17.0)

## 2024-04-23 MED ORDER — GUAIFENESIN-DM 100-10 MG/5ML PO SYRP
5.0000 mL | ORAL_SOLUTION | ORAL | Status: DC | PRN
  Administered 2024-04-23 – 2024-04-28 (×4): 5 mL via ORAL
  Filled 2024-04-23 (×4): qty 10

## 2024-04-23 MED ORDER — SODIUM PHOSPHATES 45 MMOLE/15ML IV SOLN
30.0000 mmol | Freq: Once | INTRAVENOUS | Status: AC
  Administered 2024-04-23: 30 mmol via INTRAVENOUS
  Filled 2024-04-23: qty 10

## 2024-04-23 MED ORDER — MAGNESIUM SULFATE 4 GM/100ML IV SOLN
4.0000 g | Freq: Once | INTRAVENOUS | Status: AC
Start: 2024-04-23 — End: 2024-04-23
  Administered 2024-04-23: 4 g via INTRAVENOUS
  Filled 2024-04-23: qty 100

## 2024-04-23 MED ORDER — POTASSIUM & SODIUM PHOSPHATES 280-160-250 MG PO PACK
1.0000 | PACK | Freq: Two times a day (BID) | ORAL | Status: AC
  Administered 2024-04-23 (×2): 1 via ORAL
  Filled 2024-04-23 (×2): qty 1

## 2024-04-23 NOTE — Progress Notes (Signed)
 Progress Note   Patient: Bernard Harris FMW:[id number] DOB: [birth year] DOA: 04/15/2024     7 DOS: the patient was seen and examined on 04/23/2024      Brief hospital course: From HPI Bernard Harris is a 63 y.o. male with medical history significant for COPD, tobacco use disorder, COPD, HTN, CAD with history of stent 2011, being admitted with melena and acute blood loss anemia(Hb 5.4) as well as severe AKI(creatinine 6.44) believe NSAID related.  Patient presented with 2 weeks of fatigue and worsening dyspnea on exertion.  He has noted black stool for the past week.  Reports he has been having knee pain from a fall 3 weeks ago and has been using ibuprofen up to 7 tablets twice a day based on what a friend told him his doctor prescribed for similar problem. Endorses drinking anywhere from 1-5 drinks of vodka daily but has not done so recently due to the weakness(states half gallon of vodka lasts 2 to 3 weeks) In the ED hemoglobin of 5.4, with most recent of 7.8 back in April 2025. Other hospital course as noted below     Assessment and Plan: Melena NSAID induced gastropathy/alcoholic gastritis Acute on likely chronic blood loss anemia  Hypotension secondary to hypovolemia/acute blood loss On presentation patient had hemoglobin of 5.4, with most recent of 7.8 back in April 2025. S/p 3 units of blood transfusion Continue Protonix  , Octreotide  discontinued Patient underwent EGD by GI which did not show acute bleeding, EGD was also limited as patient had lot of food intake at. Gastroenterologist planning outpatient follow-up with repeat EGD  Monitor Hb, 9.1, slowly down trending, post op. No evidence of melena or active bleeding, Monitor Hb    AKI (acute kidney injury) - resolved Anion gap metabolic acidosis Suspect NSAID induced nephropathy Suspect ATN/(?interstitial nephritis) related to NSAIDs in combination with hypovolemia/hypotension from blood loss CT chest abdomen and  pelvis with no concerning findings and kidneys/urinary system S/p Iv fluids Renal function currently back to baseline and nephrologist have signed off   Electrolyte abnormality Hypokalemia, Severe hypomagnesemia Hypophosphatemia Likely related to hypovolemia, hypotension, alcohol use, possible refeeding syndrome Monitor and replete as needed  Hypocalcemia - Corrected calcium  7.8, on p.o. calcium  supplements - asymptomatic   CAD S/P percutaneous coronary angioplasty Elevated troponin Troponin elevated in the 200s but downtrending, likely supply/demand mismatch Will hold aspirin due to bleeding Hold metoprolol, lisinopril, nitro due to hypotension Echocardiogram showing EF 40 to 45% with normal diastolic parameter Previous hospitalist discussed with Dr. Darron and patient follows up with Dr. 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  Constitutional:      General: He is awake. He is not in acute distress.    Appearance: He is cachectic.   HENT:     Head: Normocephalic and atraumatic.  Cardiovascular:     Rate and Rhythm: Normal rate and regular rhythm.     Heart sounds: Normal heart sounds.  Pulmonary:     Effort: Pulmonary effort is normal.     Breath sounds: Normal breath sounds.  Abdominal:     Palpations: Abdomen is soft.     Tenderness: There is no abdominal tenderness.  Neurological:     Mental Status: He is alert. Mental status is at baseline.  Musculoskeletal: Dressing noted to bilateral foot    Data Reviewed:  Vitals:   04/23/24 0444 04/23/24 0809 04/23/24 1101 04/23/24 1535  BP:  132/81 113/79 132/80  Pulse:  90 84 83  Resp:  17 18 18   Temp:  98.6 F (37 C) 98.6 F (37 C) 98.4 F (36.9 C)  TempSrc:      SpO2:  100% 100% 100%  Weight: 48.2 kg     Height:        Data Reviewed:    Latest Ref Rng & Units 04/23/2024    3:59 AM 04/22/2024    4:35 AM 04/21/2024    4:54 AM  CBC  WBC 4.0 - 10.5 K/uL  12.3  14.5   Hemoglobin 13.0 - 17.0 g/dL 9.1  9.8  89.4   Hematocrit 39.0 - 52.0 %  29.4  31.7   Platelets 150 - 400 K/uL  150  151        Latest Ref Rng & Units 04/23/2024    3:59 AM 04/22/2024    4:38 AM 04/21/2024    5:40 PM  BMP  Glucose 70 - 99 mg/dL 884  838  877   BUN 8 - 23 mg/dL 8  10  12    Creatinine 0.61 - 1.24 mg/dL 9.14  9.21  9.12   Sodium 135 - 145 mmol/L 133  135  138   Potassium 3.5 - 5.1 mmol/L 4.4  3.0  3.6   Chloride 98 - 111 mmol/L 112  112  108   CO2 22 - 32 mmol/L 16  17  16    Calcium  8.9 - 10.3 mg/dL 6.7  6.4  6.2       Author: Laree Lock, MD 04/23/2024 4:39 PM  For on call review [email or web address].

## 2024-04-23 NOTE — Consult Note (Signed)
 PHARMACY CONSULT NOTE - ELECTROLYTES  Pharmacy Consult for Electrolyte Monitoring and Replacement   Recent Labs: Height: 5' 9 (175.3 cm) Weight: 48.2 kg (106 lb 4.2 oz) IBW/kg (Calculated) : 70.7 Estimated Creatinine Clearance: 59.1 mL/min (by C-G formula based on SCr of 0.85 mg/dL). Potassium (mmol/L)  Date Value  04/23/2024 4.4   Magnesium  (mg/dL)  Date Value  90/80/7974 1.3 (L)   Calcium  (mg/dL)  Date Value  90/80/7974 6.7 (L)   Albumin (g/dL)  Date Value  90/80/7974 2.3 (L)   Phosphorus (mg/dL)  Date Value  90/80/7974 1.3 (L)   Sodium (mmol/L)  Date Value  04/23/2024 133 (L)   Corrected Ca: 8.08 mg/dL  Assessment  Bernard Harris is a 63 y.o. male presenting with melena and acute blood loss. PMH significant for COPD, tobacco use disorder, COPD, HTN, CAD with history of stent 2011. Noted patient with cachexia and severe protein caloric malnutrition. Pharmacy has been consulted to monitor and replace electrolytes.  Diet: regular diet MIVF: NS @ 50 mL/hr  Goal of Therapy: Electrolytes WNL  Plan:  K at goal today, Phos dropped to 1.3, will replace with NaPhos 30 mmol IV x 1 dose today plus PhosNaK 1 pack po BID x 2 doses. Magnesium  dropped as well to 1.3, will replace with MgSufate 4gm IV x 1 dose. Corrected calcium  at 8.4; patient already on schedule oral calcium  (1000mg  BID). No additional replacement at this time. Check renal function panel and Mg with AM labs  Thank you for allowing pharmacy to be a part of this patient's care.  Mackenzie Lia Rodriguez-Guzman PharmD, BCPS 04/23/2024 7:10 AM

## 2024-04-23 NOTE — Progress Notes (Signed)
 Occupational Therapy Treatment Patient Details Name: Bernard Harris MRN: [id number] DOB: 05/08/1961 Today's Date: 04/23/2024   History of present illness Pt is a 63 y.o. male presenting to hospital 04/15/24 with c/o weakness; c/o knee pain from fall 3 weeks prior.  Pt admitted with melena, NSAID induced gastropathy/alcoholic gastritis, acute on likely chronic blood loss anemia, hypotension, AKI, anion gap metabolic acidosis, electrolyte abnormality, cachexia.  S/p B TMA 04/19/24.  PMH includes F femoral head AVN, alcohol use disorder, CAD s/p stent, COPD (no home O2), htn, HLD, dyspnea with exertion, B foot surgery in 1980's.   OT comments  Chart reviewed to date, pt greeted semi supine in bed, agreeable to OT tx session targeting improving functional activity tolerance in prep for ADL tasks. Improved overall tolerance for activity on this date, pt performs bed mobility with CGA. Good static sitting balance. MOD-MAX A +1-2 for lateral scoot transfer to the chair, adhering to NWBing BLE. MAX A for washing build up from under nails, supervision for oral care/washing face. Pt is left in chair, all needs met. NT in room with hand off provided, sling in room for transfer back to bed. OT will continue to follow.       If plan is discharge home, recommend the following:  A lot of help with walking and/or transfers;A lot of help with bathing/dressing/bathroom;Direct supervision/assist for medications management;Direct supervision/assist for financial management;Assistance with cooking/housework;Help with stairs or ramp for entrance   Equipment Recommendations  Other (comment) (defer)    Recommendations for Other Services      Precautions / Restrictions Precautions Precautions: Fall Recall of Precautions/Restrictions: Impaired Restrictions Weight Bearing Restrictions Per Provider Order: Yes RLE Weight Bearing Per Provider Order: Non weight bearing LLE Weight Bearing Per Provider Order: Non  weight bearing       Mobility Bed Mobility Overal bed mobility: Needs Assistance Bed Mobility: Supine to Sit     Supine to sit: Min assist, HOB elevated          Transfers Overall transfer level: Needs assistance Equipment used: None Transfers: Bed to chair/wheelchair/BSC            Lateral/Scoot Transfers: Mod assist, Max assist, +2 physical assistance, +2 safety/equipment       Balance Overall balance assessment: Needs assistance Sitting-balance support: Bilateral upper extremity supported, Feet unsupported Sitting balance-Leahy Scale: Fair         Standing balance comment: Deferred (pt NWB'ing B LE's)                           ADL either performed or assessed with clinical judgement   ADL Overall ADL's : Needs assistance/impaired     Grooming: Maximal assistance Grooming Details (indicate cue type and reason): to remove build up from under finger nails, supervision for washing face                 Toilet Transfer: Moderate assistance;Maximal assistance Toilet Transfer Details (indicate cue type and reason): simulated to bedside chair                Extremity/Trunk Assessment              Vision       Perception     Praxis     Communication Communication Communication: No apparent difficulties   Cognition Arousal: Alert Behavior During Therapy: Flat affect, WFL for tasks assessed/performed Cognition: No family/caregiver present to determine baseline, Cognition impaired  Attention impairment (select first level of impairment): Selective attention Executive functioning impairment (select all impairments): Reasoning, Problem solving                   Following commands: Intact        Cueing   Cueing Techniques: Verbal cues, Tactile cues, Visual cues  Exercises Other Exercises Other Exercises: edu re role of OT, role of rehab, discharge recommendations    Shoulder Instructions        General Comments vss; B feet wrapped in ace wraps    Pertinent Vitals/ Pain       Pain Assessment Pain Assessment: No/denies pain  Home Living                                          Prior Functioning/Environment              Frequency  Min 2X/week        Progress Toward Goals  OT Goals(current goals can now be found in the care plan section)  Progress towards OT goals: Progressing toward goals  Acute Rehab OT Goals Time For Goal Achievement: 05/04/24  Plan      Co-evaluation    PT/OT/SLP Co-Evaluation/Treatment: Yes Reason for Co-Treatment: To address functional/ADL transfers;Complexity of the patient's impairments (multi-system involvement);For patient/therapist safety          AM-PAC OT 6 Clicks Daily Activity     Outcome Measure   Help from another person eating meals?: None Help from another person taking care of personal grooming?: A Little Help from another person toileting, which includes using toliet, bedpan, or urinal?: A Lot Help from another person bathing (including washing, rinsing, drying)?: A Lot Help from another person to put on and taking off regular upper body clothing?: A Little Help from another person to put on and taking off regular lower body clothing?: A Lot 6 Click Score: 16    End of Session    OT Visit Diagnosis: Other abnormalities of gait and mobility (R26.89);Muscle weakness (generalized) (M62.81)   Activity Tolerance Patient tolerated treatment well   Patient Left in chair;with call bell/phone within reach;with chair alarm set   Nurse Communication Mobility status        Time: 8583-8556 OT Time Calculation (min): 27 min  Charges: OT General Charges $OT Visit: 1 Visit OT Treatments $Therapeutic Activity: 8-22 mins  Therisa Sheffield, OTD OTR/L  04/23/24, 3:54 PM

## 2024-04-23 NOTE — TOC Progression Note (Signed)
 Transition of Care [facility]) - Progression Note    Patient Details  Name: Shahzaib Azevedo MRN: [id number] Date of Birth: [birth year]  Transition of [facility]) CM/SW Contact  Racheal LITTIE Schimke, RN Phone Number: 04/23/2024, 4:24 PM  Clinical Narrative: Patient preferred [facility], however when I called I was informed that they did not have a bed and may not have one until Wed. Or Thurs next week. Sister [name] called per patient's request, WOM selected, spoke with Kayla, who will start the Auth. Process Monday.                      Expected Discharge Plan and Services                                               Social Drivers of Health (SDOH) Interventions SDOH Screenings   Food Insecurity: Food Insecurity Present (04/17/2024)  Housing: Low Risk  (04/17/2024)  Transportation Needs: No Transportation Needs (04/17/2024)  Utilities: Not At Risk (04/17/2024)  Financial Resource Strain: Patient Declined (11/04/2023)   Received from [facility]  Social Connections: Moderately Integrated (04/17/2024)  Tobacco Use: High Risk (04/19/2024)    Readmission Risk Interventions     No data to display

## 2024-04-23 NOTE — Progress Notes (Signed)
 Physical Therapy Treatment Patient Details Name: Muhannad Bignell MRN: [id number] DOB: 04/11/1961 Today's Date: 04/23/2024   History of Present Illness Pt is a 63 y.o. male presenting to hospital 04/15/24 with c/o weakness; c/o knee pain from fall 3 weeks prior.  Pt admitted with melena, NSAID induced gastropathy/alcoholic gastritis, acute on likely chronic blood loss anemia, hypotension, AKI, anion gap metabolic acidosis, electrolyte abnormality, cachexia.  S/p B TMA 04/19/24.  PMH includes F femoral head AVN, alcohol use disorder, CAD s/p stent, COPD (no home O2), htn, HLD, dyspnea with exertion, B foot surgery in 1980's.    PT Comments  Seen with OT for assisted transfer, bed/chair.  Able to complete bed mobility, sitting balance and bed/chair transfer with less physical assist this date.  Demonstrates ability to initiate lateral scooting and partial lift off from seating surface with bilat UEs, still maintaining NWB bilat LEs (though does require frequent cuing/reminders for functional application).    Continues to require consistent physical assist/cuing for optimal safety; discharge recs remain appropriate at this time.  May benefit from transfer training to/from [facility] next date with initiation of [facility] parts/management training as appropriate.   If plan is discharge home, recommend the following: Two people to help with walking and/or transfers;A lot of help with bathing/dressing/bathroom;Assistance with cooking/housework;Assist for transportation;Help with stairs or ramp for entrance   Can travel by private vehicle     No  Equipment Recommendations       Recommendations for Other Services       Precautions / Restrictions Precautions Precautions: Fall Restrictions Weight Bearing Restrictions Per Provider Order: Yes RLE Weight Bearing Per Provider Order: Non weight bearing LLE Weight Bearing Per Provider Order: Non weight bearing     Mobility  Bed Mobility Overal bed mobility:  Needs Assistance Bed Mobility: Supine to Sit     Supine to sit: Min assist, HOB elevated, Contact guard     General bed mobility comments: improved ability to initiate/complete movement transition this date    Transfers Overall transfer level: Needs assistance   Transfers: Bed to chair/wheelchair/BSC            Lateral/Scoot Transfers: Min assist, Mod assist, +2 safety/equipment General transfer comment: improved sitting balance and bilat UE strength; initiates lift off and lateral scooting along edge of bed with fair control this date.  Slightly impulsive, but does maintain bilat LEs in NWB throughout transfer (consistent cuing for awareness)    Ambulation/Gait               General Gait Details: Not appropriate at this time (pt NWB'ing B LE's)   Stairs             Wheelchair Mobility     Tilt Bed    Modified Rankin (Stroke Patients Only)       Balance Overall balance assessment: Needs assistance Sitting-balance support: No upper extremity supported, Feet supported Sitting balance-Leahy Scale: Fair                                      Hotel manager: No apparent difficulties  Cognition Arousal: Alert Behavior During Therapy: WFL for tasks assessed/performed                             Following commands: Intact Following commands impaired: Follows one step commands with increased time    Cueing  Cueing Techniques: Verbal cues, Tactile cues, Visual cues  Exercises      General Comments General comments (skin integrity, edema, etc.): vss; B feet wrapped in ace wraps      Pertinent Vitals/Pain Pain Assessment Pain Assessment: No/denies pain    Home Living                          Prior Function            PT Goals (current goals can now be found in the care plan section) Acute Rehab PT Goals Patient Stated Goal: to improve pain and mobility PT Goal Formulation: With  patient Time For Goal Achievement: 05/04/24 Potential to Achieve Goals: Fair Progress towards PT goals: Progressing toward goals    Frequency    Min 2X/week      PT Plan      Co-evaluation PT/OT/SLP Co-Evaluation/Treatment: Yes Reason for Co-Treatment: To address functional/ADL transfers;Complexity of the patient's impairments (multi-system involvement);For patient/therapist safety   OT goals addressed during session: ADL's and self-care      AM-PAC PT 6 Clicks Mobility   Outcome Measure  Help needed turning from your back to your side while in a flat bed without using bedrails?: A Little Help needed moving from lying on your back to sitting on the side of a flat bed without using bedrails?: A Little Help needed moving to and from a bed to a chair (including a wheelchair)?: A Lot Help needed standing up from a chair using your arms (e.g., wheelchair or bedside chair)?: Total Help needed to walk in hospital room?: Total Help needed climbing 3-5 steps with a railing? : Total 6 Click Score: 11    End of Session   Activity Tolerance: Patient tolerated treatment well Patient left: in chair;with call bell/phone within reach;with chair alarm set (OT at bedside)   PT Visit Diagnosis: Other abnormalities of gait and mobility (R26.89);Muscle weakness (generalized) (M62.81);History of falling (Z91.81);Pain Pain - Right/Left: Right Pain - part of body: Ankle and joints of foot     Time: 1416-1430 PT Time Calculation (min) (ACUTE ONLY): 14 min  Charges:    $Therapeutic Activity: 8-22 mins PT General Charges $$ ACUTE PT VISIT: 1 Visit                     Dorsie Burich H. Delores, PT, DPT, NCS 04/23/24, 4:09 PM [phone]

## 2024-04-24 DIAGNOSIS — K921 Melena: Secondary | ICD-10-CM | POA: Diagnosis not present

## 2024-04-24 LAB — BASIC METABOLIC PANEL WITH GFR
Anion gap: 9 (ref 5–15)
BUN: 8 mg/dL (ref 8–23)
CO2: 18 mmol/L — ABNORMAL LOW (ref 22–32)
Calcium: 7.2 mg/dL — ABNORMAL LOW (ref 8.9–10.3)
Chloride: 106 mmol/L (ref 98–111)
Creatinine, Ser: 0.66 mg/dL (ref 0.61–1.24)
GFR, Estimated: >60 mL/min (ref >60)
Glucose, Bld: 139 mg/dL — ABNORMAL HIGH (ref 70–99)
Potassium: 4.3 mmol/L (ref 3.5–5.1)
Sodium: 133 mmol/L — ABNORMAL LOW (ref 135–145)

## 2024-04-24 LAB — AEROBIC/ANAEROBIC CULTURE W GRAM STAIN (SURGICAL/DEEP WOUND)
Culture: NO GROWTH
Gram Stain: NONE SEEN
Gram Stain: NONE SEEN

## 2024-04-24 LAB — HEMOGLOBIN: Hemoglobin: 9.1 g/dL — ABNORMAL LOW (ref 13.0–17.0)

## 2024-04-24 LAB — PHOSPHORUS: Phosphorus: 1.6 mg/dL — ABNORMAL LOW (ref 2.5–4.6)

## 2024-04-24 LAB — GLUCOSE, CAPILLARY: Glucose-Capillary: 166 mg/dL — ABNORMAL HIGH (ref 70–99)

## 2024-04-24 LAB — MAGNESIUM: Magnesium: 1.5 mg/dL — ABNORMAL LOW (ref 1.7–2.4)

## 2024-04-24 MED ORDER — MAGNESIUM SULFATE 2 GM/50ML IV SOLN
2.0000 g | Freq: Once | INTRAVENOUS | Status: AC
  Administered 2024-04-24: 2 g via INTRAVENOUS
  Filled 2024-04-24: qty 50

## 2024-04-24 MED ORDER — SODIUM CHLORIDE 0.9 % IV SOLN
30.0000 mmol | Freq: Once | INTRAVENOUS | Status: AC
  Administered 2024-04-24: 30 mmol via INTRAVENOUS
  Filled 2024-04-24: qty 10

## 2024-04-24 NOTE — Progress Notes (Signed)
 MD and charge RN updated re:  Pt is refusing respiratory panel swab stating that he does not want anything all the way up his nose. Explained to pt x3 importance of respiratory testing, but he states that he feels well and does not want anything up his nose. Will try asking again later.

## 2024-04-24 NOTE — Consult Note (Signed)
 PHARMACY CONSULT NOTE - ELECTROLYTES  Pharmacy Consult for Electrolyte Monitoring and Replacement   Recent Labs: Height: 5' 9 (175.3 cm) Weight: 46.1 kg (101 lb 10.1 oz) IBW/kg (Calculated) : 70.7 Estimated Creatinine Clearance: 60 mL/min (by C-G formula based on SCr of 0.66 mg/dL). Potassium (mmol/L)  Date Value  04/24/2024 4.3   Magnesium  (mg/dL)  Date Value  90/79/7974 1.5 (L)   Calcium  (mg/dL)  Date Value  90/79/7974 7.2 (L)   Albumin (g/dL)  Date Value  90/80/7974 2.3 (L)   Phosphorus (mg/dL)  Date Value  90/79/7974 1.6 (L)   Sodium (mmol/L)  Date Value  04/24/2024 133 (L)   Corrected Ca: 8.08 mg/dL  Assessment  Bernard Harris is a 63 y.o. male presenting with melena and acute blood loss. PMH significant for COPD, tobacco use disorder, COPD, HTN, CAD with history of stent 2011. Noted patient with cachexia and severe protein caloric malnutrition. Pharmacy has been consulted to monitor and replace electrolytes.  Diet: regular diet MIVF: NS @ 50 mL/hr  Goal of Therapy: Electrolytes WNL  Plan:  NaPhos 30 mmol IV x 1  Mg 2 g IV x 1 F/u with AM labs.   Thank you for allowing pharmacy to be a part of this patient's care.  Cathaleen Blanch PharmD, BCPS 04/24/2024 7:20 AM

## 2024-04-24 NOTE — Progress Notes (Signed)
 Progress Note   Patient: Bernard Harris FMW:[id number] DOB: [birth year] DOA: 04/15/2024     8 DOS: the patient was seen and examined on 04/24/2024      Brief hospital course: From HPI Bernard Harris is a 63 y.o. male with medical history significant for COPD, tobacco use disorder, COPD, HTN, CAD with history of stent 2011, being admitted with melena and acute blood loss anemia(Hb 5.4) as well as severe AKI(creatinine 6.44) believe NSAID related.  Patient presented with 2 weeks of fatigue and worsening dyspnea on exertion.  He has noted black stool for the past week.  Reports he has been having knee pain from a fall 3 weeks ago and has been using ibuprofen up to 7 tablets twice a day based on what a friend told him his doctor prescribed for similar problem. Endorses drinking anywhere from 1-5 drinks of vodka daily but has not done so recently due to the weakness(states half gallon of vodka lasts 2 to 3 weeks) In the ED hemoglobin of 5.4, with most recent of 7.8 back in April 2025. Other hospital course as noted below     Assessment and Plan: Melena NSAID induced gastropathy/alcoholic gastritis Acute on likely chronic blood loss anemia  Hypotension secondary to hypovolemia/acute blood loss On presentation patient had hemoglobin of 5.4, with most recent of 7.8 back in April 2025. S/p 3 units of blood transfusion Continue Protonix  , Octreotide  discontinued Patient underwent EGD by GI which did not show acute bleeding, EGD was also limited as patient had lot of food intake at. Gastroenterologist planning outpatient follow-up with repeat EGD  Monitor Hb, 9.1, stable, post op. No evidence of melena or active bleeding, Monitor Hb    AKI (acute kidney injury) - resolved Anion gap metabolic acidosis Suspect NSAID induced nephropathy Suspect ATN/(?interstitial nephritis) related to NSAIDs in combination with hypovolemia/hypotension from blood loss CT chest abdomen and pelvis with no  concerning findings and kidneys/urinary system S/p Iv fluids Renal function currently back to baseline and nephrologist have signed off   Electrolyte abnormality Hypokalemia, Severe hypomagnesemia Hypophosphatemia Likely related to hypovolemia, hypotension, alcohol use, possible refeeding syndrome Monitor and replete as needed, as per pharmacy  Hypocalcemia - Corrected calcium  7.8, on p.o. calcium  supplements - asymptomatic   CAD S/P percutaneous coronary angioplasty Elevated troponin Troponin elevated in the 200s but downtrending, likely supply/demand mismatch Will hold aspirin due to bleeding Hold metoprolol, lisinopril, nitro due to hypotension Echocardiogram showing EF 40 to 45% with normal diastolic parameter Previous hospitalist discussed with Dr. Darron and patient follows up with Dr. 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  Constitutional:      General: He is awake. He is not in acute distress.    Appearance: He is  cachectic.  HENT:     Head: Normocephalic and atraumatic.  Cardiovascular:     Rate and Rhythm: Normal rate and regular rhythm.     Heart sounds: Normal heart sounds.  Pulmonary:     Effort: Pulmonary effort is normal.     Breath sounds: Normal breath sounds.  Abdominal:     Palpations: Abdomen is soft.     Tenderness: There is no abdominal tenderness.  Neurological:     Mental Status: He is alert. Mental status is at baseline.  Musculoskeletal: Dressing noted to bilateral foot    Data Reviewed:  Vitals:   04/24/24 0500 04/24/24 0810 04/24/24 1145 04/24/24 1519  BP:  125/80 119/81 117/73  Pulse:  95 84 79  Resp:  16 16 17   Temp:  98.3 F (36.8 C) 97.7 F (36.5 C) 97.7 F (36.5 C)  TempSrc:      SpO2:  100% 100% 100%  Weight: 46.1 kg     Height:        Data Reviewed:    Latest Ref Rng & Units 04/24/2024    4:33 AM 04/23/2024    3:59 AM 04/22/2024    4:35 AM  CBC  WBC 4.0 - 10.5 K/uL   12.3   Hemoglobin 13.0 - 17.0 g/dL 9.1  9.1  9.8   Hematocrit 39.0 - 52.0 %   29.4   Platelets 150 - 400 K/uL   150        Latest Ref Rng & Units 04/24/2024    4:33 AM 04/23/2024    3:59 AM 04/22/2024    4:38 AM  BMP  Glucose 70 - 99 mg/dL 860  884  838   BUN 8 - 23 mg/dL 8  8  10    Creatinine 0.61 - 1.24 mg/dL 9.33  9.14  9.21   Sodium 135 - 145 mmol/L 133  133  135   Potassium 3.5 - 5.1 mmol/L 4.3  4.4  3.0   Chloride 98 - 111 mmol/L 106  112  112   CO2 22 - 32 mmol/L 18  16  17    Calcium  8.9 - 10.3 mg/dL 7.2  6.7  6.4       Author: Laree Lock, MD 04/24/2024 3:33 PM  For on call review [email or web address].

## 2024-04-25 DIAGNOSIS — K921 Melena: Secondary | ICD-10-CM | POA: Diagnosis not present

## 2024-04-25 LAB — PHOSPHORUS: Phosphorus: 2.2 mg/dL — ABNORMAL LOW (ref 2.5–4.6)

## 2024-04-25 LAB — BASIC METABOLIC PANEL WITH GFR
Anion gap: 11 (ref 5–15)
BUN: 8 mg/dL (ref 8–23)
CO2: 18 mmol/L — ABNORMAL LOW (ref 22–32)
Calcium: 7.7 mg/dL — ABNORMAL LOW (ref 8.9–10.3)
Chloride: 106 mmol/L (ref 98–111)
Creatinine, Ser: 0.74 mg/dL (ref 0.61–1.24)
GFR, Estimated: >60 mL/min (ref >60)
Glucose, Bld: 136 mg/dL — ABNORMAL HIGH (ref 70–99)
Potassium: 4.7 mmol/L (ref 3.5–5.1)
Sodium: 135 mmol/L (ref 135–145)

## 2024-04-25 LAB — CBC
HCT: 28.8 % — ABNORMAL LOW (ref 39.0–52.0)
Hemoglobin: 9.4 g/dL — ABNORMAL LOW (ref 13.0–17.0)
MCH: 27.9 pg (ref 26.0–34.0)
MCHC: 32.6 g/dL (ref 30.0–36.0)
MCV: 85.5 fL (ref 80.0–100.0)
Platelets: 187 K/uL (ref 150–400)
RBC: 3.37 MIL/uL — ABNORMAL LOW (ref 4.22–5.81)
RDW: 18.1 % — ABNORMAL HIGH (ref 11.5–15.5)
WBC: 11.4 K/uL — ABNORMAL HIGH (ref 4.0–10.5)
nRBC: 0 % (ref 0.0–0.2)

## 2024-04-25 LAB — AEROBIC/ANAEROBIC CULTURE W GRAM STAIN (SURGICAL/DEEP WOUND): Gram Stain: NONE SEEN

## 2024-04-25 LAB — GLUCOSE, CAPILLARY: Glucose-Capillary: 137 mg/dL — ABNORMAL HIGH (ref 70–99)

## 2024-04-25 LAB — MAGNESIUM: Magnesium: 1.3 mg/dL — ABNORMAL LOW (ref 1.7–2.4)

## 2024-04-25 MED ORDER — SODIUM CHLORIDE 0.9 % IV SOLN
30.0000 mmol | Freq: Once | INTRAVENOUS | Status: AC
  Administered 2024-04-25: 30 mmol via INTRAVENOUS
  Filled 2024-04-25: qty 10

## 2024-04-25 MED ORDER — MAGNESIUM SULFATE 4 GM/100ML IV SOLN
4.0000 g | Freq: Once | INTRAVENOUS | Status: AC
  Administered 2024-04-25: 4 g via INTRAVENOUS
  Filled 2024-04-25: qty 100

## 2024-04-25 NOTE — Plan of Care (Signed)
  Problem: Education: Goal: Knowledge of condition and prescribed therapy will improve Outcome: Progressing   Problem: Cardiac: Goal: Will achieve and/or maintain adequate cardiac output Outcome: Progressing   Problem: Physical Regulation: Goal: Complications related to the disease process, condition or treatment will be avoided or minimized Outcome: Progressing   Problem: Education: Goal: Knowledge of General Education information will improve Description: Including pain rating scale, medication(s)/side effects and non-pharmacologic comfort measures Outcome: Progressing   Problem: Health Behavior/Discharge Planning: Goal: Ability to manage health-related needs will improve Outcome: Progressing   

## 2024-04-25 NOTE — Consult Note (Signed)
 PHARMACY CONSULT NOTE - ELECTROLYTES  Pharmacy Consult for Electrolyte Monitoring and Replacement   Recent Labs: Height: 5' 9 (175.3 cm) Weight: 44.2 kg (97 lb 7.1 oz) IBW/kg (Calculated) : 70.7 Estimated Creatinine Clearance: 57.6 mL/min (by C-G formula based on SCr of 0.74 mg/dL). Potassium (mmol/L)  Date Value  04/25/2024 4.7   Magnesium  (mg/dL)  Date Value  90/78/7974 1.3 (L)   Calcium  (mg/dL)  Date Value  90/78/7974 7.7 (L)   Albumin (g/dL)  Date Value  90/80/7974 2.3 (L)   Phosphorus (mg/dL)  Date Value  90/78/7974 2.2 (L)   Sodium (mmol/L)  Date Value  04/25/2024 135   Assessment  Bernard Harris is a 63 y.o. male presenting with melena and acute blood loss. PMH significant for COPD, tobacco use disorder, COPD, HTN, CAD with history of stent 2011. Noted patient with cachexia and severe protein caloric malnutrition. Pharmacy has been consulted to monitor and replace electrolytes.  Diet: regular diet MIVF: NS @ 50 mL/hr  Goal of Therapy: Electrolytes WNL  Plan:  --Phos 2.2, sodium phosphate  30 mmol IV x 1 --Mg 1.3, magnesium  sulfate 4 g IV x 1  Thank you for allowing pharmacy to be a part of this patient's care.  Marolyn KATHEE Mare 04/25/2024 9:02 AM

## 2024-04-25 NOTE — Progress Notes (Addendum)
 Progress Note   Patient: Bernard Harris FMW:[id number] DOB: Mar 05, 1961 DOA: 04/15/2024     9 DOS: the patient was seen and examined on 04/25/2024      Brief hospital course: From HPI Bernard Harris is a 63 y.o. male with medical history significant for COPD, tobacco use disorder, COPD, HTN, CAD with history of stent 2011, being admitted with melena and acute blood loss anemia(Hb 5.4) as well as severe AKI(creatinine 6.44) believe NSAID related.  Patient presented with 2 weeks of fatigue and worsening dyspnea on exertion.  He has noted black stool for the past week.  Reports he has been having knee pain from a fall 3 weeks ago and has been using ibuprofen up to 7 tablets twice a day based on what a friend told him his doctor prescribed for similar problem. Endorses drinking anywhere from 1-5 drinks of vodka daily but has not done so recently due to the weakness(states half gallon of vodka lasts 2 to 3 weeks) In the ED hemoglobin of 5.4, with most recent of 7.8 back in April 2025. Other hospital course as noted below     Assessment and Plan: Melena NSAID induced gastropathy/alcoholic gastritis Acute on likely chronic blood loss anemia  Hypotension secondary to hypovolemia/acute blood loss On presentation patient had hemoglobin of 5.4, with most recent of 7.8 back in April 2025. S/p 3 units of blood transfusion Continue Protonix  , Octreotide  discontinued Patient underwent EGD by GI which did not show acute bleeding, EGD was also limited as patient had lot of food intake at. Gastroenterologist planning outpatient follow-up with repeat EGD  Monitor Hb, stable. No evidence of melena or active bleeding    AKI (acute kidney injury) - resolved Anion gap metabolic acidosis Suspect NSAID induced nephropathy Suspect ATN/(?interstitial nephritis) related to NSAIDs in combination with hypovolemia/hypotension from blood loss CT chest abdomen and pelvis with no concerning findings and  kidneys/urinary system S/p Iv fluids Renal function currently back to baseline and nephrologist have signed off   Electrolyte abnormality Hypokalemia, Severe hypomagnesemia Hypophosphatemia Likely related to hypovolemia, hypotension, alcohol use, possible refeeding syndrome Monitor and replete as needed, as per pharmacy  Hypocalcemia - Corrected calcium  7.8, on p.o. calcium  supplements - asymptomatic   CAD S/P percutaneous coronary angioplasty Elevated troponin Troponin elevated in the 200s but downtrending, likely supply/demand mismatch Will hold aspirin due to bleeding Hold metoprolol, lisinopril, nitro due to hypotension Echocardiogram showing EF 40 to 45% with normal diastolic parameter Previous hospitalist discussed with Dr. Darron and patient follows up with Dr. 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 by podiatrist on 04/19/2024 Strict NWB x 3 weeks Leukocytosis improving, Augmentin  5 days postop, follow-up with podiatry outpatient in 2 weeks TOC consulted  PT OT has recommended skilled nursing facility placement     DVT prophylaxis: SCD   Consults: GI, nephrology, Podiatry   Advance Care Planning:   Code Status: Full Code    Family Communication: Discussed with patient's sister [name] over phone   Disposition Plan: PT recommends skilled nursing facility     Subjective:  Patient reports sore throat and cough, refused RVP TOC working on placement   Physical Exam: Vitals and nursing note reviewed.  Constitutional:      General: He is awake. He is not in acute distress.    Appearance: He is cachectic.   HENT:     Head: Normocephalic and atraumatic.  Cardiovascular:     Rate and Rhythm: Normal rate and regular rhythm.     Heart sounds: Normal heart sounds.  Pulmonary:     Effort: Pulmonary effort is normal.     Breath sounds: Normal breath sounds.  Abdominal:     Palpations: Abdomen is soft.     Tenderness: There is no abdominal tenderness.  Neurological:     Mental Status: He is alert. Mental status is at baseline.  Musculoskeletal: Dressing noted to bilateral foot    Data Reviewed:  Vitals:   04/25/24 0500 04/25/24 0853 04/25/24 1247 04/25/24 1726  BP:  132/83 130/77 129/78  Pulse:  88 79 84  Resp:  18 18 18   Temp:  98.6 F (37 C) 98.8 F (37.1 C) 98.6 F (37 C)  TempSrc:  Oral Oral Oral  SpO2:  100% 100% 100%  Weight: 44.2 kg     Height:        Data Reviewed:    Latest Ref Rng & Units 04/25/2024    4:52 AM 04/24/2024    4:33 AM 04/23/2024    3:59 AM  CBC  WBC 4.0 - 10.5 K/uL 11.4     Hemoglobin 13.0 - 17.0 g/dL 9.4  9.1  9.1   Hematocrit 39.0 - 52.0 % 28.8     Platelets 150 - 400 K/uL 187          Latest Ref Rng & Units 04/25/2024    4:52 AM 04/24/2024    4:33 AM 04/23/2024    3:59 AM  BMP  Glucose 70 - 99 mg/dL 863  860  884   BUN 8 - 23 mg/dL 8  8  8    Creatinine 0.61 - 1.24 mg/dL 9.25  9.33  9.14   Sodium 135 - 145 mmol/L 135  133  133   Potassium 3.5 - 5.1 mmol/L 4.7  4.3  4.4   Chloride 98 - 111 mmol/L 106  106  112   CO2 22 - 32 mmol/L 18  18  16    Calcium  8.9 - 10.3 mg/dL 7.7  7.2  6.7       Author: Laree Lock, MD 04/25/2024 6:19 PM  For on call review [email or web address].

## 2024-04-26 DIAGNOSIS — K921 Melena: Secondary | ICD-10-CM | POA: Diagnosis not present

## 2024-04-26 LAB — MAGNESIUM: Magnesium: 1.3 mg/dL — ABNORMAL LOW (ref 1.7–2.4)

## 2024-04-26 LAB — BASIC METABOLIC PANEL WITH GFR
Anion gap: 7 (ref 5–15)
BUN: 8 mg/dL (ref 8–23)
CO2: 22 mmol/L (ref 22–32)
Calcium: 8 mg/dL — ABNORMAL LOW (ref 8.9–10.3)
Chloride: 106 mmol/L (ref 98–111)
Creatinine, Ser: 0.57 mg/dL — ABNORMAL LOW (ref 0.61–1.24)
GFR, Estimated: >60 mL/min (ref >60)
Glucose, Bld: 127 mg/dL — ABNORMAL HIGH (ref 70–99)
Potassium: 4.3 mmol/L (ref 3.5–5.1)
Sodium: 135 mmol/L (ref 135–145)

## 2024-04-26 LAB — PHOSPHORUS: Phosphorus: 2.8 mg/dL (ref 2.5–4.6)

## 2024-04-26 LAB — GLUCOSE, CAPILLARY: Glucose-Capillary: 132 mg/dL — ABNORMAL HIGH (ref 70–99)

## 2024-04-26 MED ORDER — FLUTICASONE PROPIONATE 50 MCG/ACT NA SUSP
1.0000 | Freq: Every day | NASAL | Status: DC
  Administered 2024-04-26 – 2024-04-30 (×5): 1 via NASAL
  Filled 2024-04-26 (×2): qty 16

## 2024-04-26 MED ORDER — MAGNESIUM SULFATE 4 GM/100ML IV SOLN
4.0000 g | Freq: Once | INTRAVENOUS | Status: AC
  Administered 2024-04-26: 4 g via INTRAVENOUS
  Filled 2024-04-26: qty 100

## 2024-04-26 MED ORDER — MAGNESIUM SULFATE 4 GM/100ML IV SOLN: 4.0000 g | Freq: Once | INTRAVENOUS | Status: DC

## 2024-04-26 NOTE — Consult Note (Signed)
 PHARMACY CONSULT NOTE - ELECTROLYTES  Pharmacy Consult for Electrolyte Monitoring and Replacement   Recent Labs: Height: 5' 9 (175.3 cm) Weight: 43.8 kg (96 lb 9 oz) IBW/kg (Calculated) : 70.7 Estimated Creatinine Clearance: 57 mL/min (A) (by C-G formula based on SCr of 0.57 mg/dL (L)). Potassium (mmol/L)  Date Value  04/26/2024 4.3   Magnesium  (mg/dL)  Date Value  90/77/7974 1.3 (L)   Calcium  (mg/dL)  Date Value  90/77/7974 8.0 (L)   Albumin (g/dL)  Date Value  90/80/7974 2.3 (L)   Phosphorus (mg/dL)  Date Value  90/77/7974 2.8   Sodium (mmol/L)  Date Value  04/26/2024 135   Assessment  Bernard Harris is a 63 y.o. male presenting with melena and acute blood loss. PMH significant for COPD, tobacco use disorder, COPD, HTN, CAD with history of stent 2011. Noted patient with cachexia and severe protein caloric malnutrition. Pharmacy has been consulted to monitor and replace electrolytes.  Diet: regular diet MIVF: NS @ 50 mL/hr  Goal of Therapy: Electrolytes WNL  Plan:  --Mg 1.3 >> magnesium  sulfate 4 g IV ordered already --No other electrolyte replacement currently warranted --Check BMP, Mg, Phos with tomorrow AM labs  Thank you for allowing pharmacy to be a part of this patient's care.  Will M. Lenon, PharmD, BCPS Clinical Pharmacist 04/26/2024 8:20 AM

## 2024-04-26 NOTE — Plan of Care (Signed)
  Problem: Nutrition: Goal: Adequate nutrition will be maintained Outcome: Progressing   Problem: Pain Managment: Goal: General experience of comfort will improve and/or be controlled Outcome: Progressing   Problem: Skin Integrity: Goal: Risk for impaired skin integrity will decrease Outcome: Progressing

## 2024-04-26 NOTE — Progress Notes (Addendum)
 Progress Note   Patient: Bernard Harris FMW:[id number] DOB: 1961-05-03 DOA: 04/15/2024     10 DOS: the patient was seen and examined on 04/26/2024      Brief hospital course: From HPI Bernard Harris is a 63 y.o. male with medical history significant for COPD, tobacco use disorder, COPD, HTN, CAD with history of stent 2011, being admitted with melena and acute blood loss anemia(Hb 5.4) as well as severe AKI(creatinine 6.44) believe NSAID related.  Patient presented with 2 weeks of fatigue and worsening dyspnea on exertion.  He has noted black stool for the past week.  Reports he has been having knee pain from a fall 3 weeks ago and has been using ibuprofen up to 7 tablets twice a day based on what a friend told him his doctor prescribed for similar problem. Endorses drinking anywhere from 1-5 drinks of vodka daily but has not done so recently due to the weakness(states half gallon of vodka lasts 2 to 3 weeks) In the ED hemoglobin of 5.4, with most recent of 7.8 back in April 2025 Other hospital course as noted below     Assessment and Plan: Melena NSAID induced gastropathy/alcoholic gastritis Acute on likely chronic blood loss anemia  Hypotension secondary to hypovolemia/acute blood loss On presentation patient had hemoglobin of 5.4, with most recent of 7.8 back in April 2025. S/p 3 units of blood transfusion Continue Protonix  , Octreotide  discontinued Patient underwent EGD by GI which did not show acute bleeding, EGD was also limited as patient had lot of food intake at. Gastroenterologist planning outpatient follow-up with repeat EGD  Monitor Hb, stable. No evidence of melena or active bleeding    AKI (acute kidney injury) - resolved Anion gap metabolic acidosis Suspect NSAID induced nephropathy Suspect ATN/(?interstitial nephritis) related to NSAIDs in combination with hypovolemia/hypotension from blood loss CT chest abdomen and pelvis with no concerning findings and  kidneys/urinary system S/p Iv fluids Renal function currently back to baseline and nephrologist have signed off   Electrolyte abnormality Hypokalemia, Severe hypomagnesemia Hypophosphatemia Likely related to hypovolemia, hypotension, alcohol use, possible refeeding syndrome Monitor and replete as needed, as per pharmacy  Hypocalcemia - on p.o. calcium  supplements - asymptomatic   CAD S/P percutaneous coronary angioplasty Elevated troponin Troponin elevated in the 200s but downtrending, likely supply/demand mismatch Will hold aspirin due to bleeding Hold metoprolol, lisinopril, nitro due to hypotension Echocardiogram showing EF 40 to 45% with normal diastolic parameter Previous hospitalist discussed with Dr. Darron and patient follows up with Dr. 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 Started Flonase  trial Updated sister [name] working on placement   Physical Exam: Vitals  and nursing note reviewed.  Constitutional:      General: He is awake. He is not in acute distress.     Appearance: He is cachectic.  HENT:     Head: Normocephalic and atraumatic.  Cardiovascular:     Rate and Rhythm: Normal rate and regular rhythm.     Heart sounds: Normal heart sounds.  Pulmonary:     Effort: Pulmonary effort is normal.     Breath sounds: Normal breath sounds.  Abdominal:     Palpations: Abdomen is soft.     Tenderness: There is no abdominal tenderness.  Neurological:     Mental Status: He is alert. Mental status is at baseline.  Musculoskeletal: Dressing noted to bilateral foot    Data Reviewed:  Vitals:   04/26/24 0500 04/26/24 0529 04/26/24 0818 04/26/24 1523  BP:  139/87 126/82 119/78  Pulse:  77 80 89  Resp:  15 16 16   Temp:  98.1 F (36.7 C) 97.7 F (36.5 C) 98.2 F (36.8 C)  TempSrc:   Oral Oral  SpO2:  100% 100% 100%  Weight: 43.8 kg     Height:        Data Reviewed:    Latest Ref Rng & Units 04/25/2024    4:52 AM 04/24/2024    4:33 AM 04/23/2024    3:59 AM  CBC  WBC 4.0 - 10.5 K/uL 11.4     Hemoglobin 13.0 - 17.0 g/dL 9.4  9.1  9.1   Hematocrit 39.0 - 52.0 % 28.8     Platelets 150 - 400 K/uL 187          Latest Ref Rng & Units 04/26/2024    3:50 AM 04/25/2024    4:52 AM 04/24/2024    4:33 AM  BMP  Glucose 70 - 99 mg/dL 872  863  860   BUN 8 - 23 mg/dL 8  8  8    Creatinine 0.61 - 1.24 mg/dL 9.42  9.25  9.33   Sodium 135 - 145 mmol/L 135  135  133   Potassium 3.5 - 5.1 mmol/L 4.3  4.7  4.3   Chloride 98 - 111 mmol/L 106  106  106   CO2 22 - 32 mmol/L 22  18  18    Calcium  8.9 - 10.3 mg/dL 8.0  7.7  7.2       Author: Laree Lock, MD 04/26/2024 3:28 PM  For on call review [email or web address].

## 2024-04-26 NOTE — Plan of Care (Signed)
  Problem: Education: Goal: Knowledge of condition and prescribed therapy will improve Outcome: Progressing   Problem: Nutrition: Goal: Adequate nutrition will be maintained Outcome: Progressing   Problem: Coping: Goal: Level of anxiety will decrease Outcome: Progressing   Problem: Pain Managment: Goal: General experience of comfort will improve and/or be controlled Outcome: Progressing   Problem: Safety: Goal: Ability to remain free from injury will improve Outcome: Progressing

## 2024-04-27 LAB — GLUCOSE, CAPILLARY: Glucose-Capillary: 118 mg/dL — ABNORMAL HIGH (ref 70–99)

## 2024-04-27 LAB — BASIC METABOLIC PANEL WITH GFR
Anion gap: 8 (ref 5–15)
BUN: 11 mg/dL (ref 8–23)
CO2: 23 mmol/L (ref 22–32)
Calcium: 8.5 mg/dL — ABNORMAL LOW (ref 8.9–10.3)
Chloride: 103 mmol/L (ref 98–111)
Creatinine, Ser: 0.69 mg/dL (ref 0.61–1.24)
GFR, Estimated: >60 mL/min (ref >60)
Glucose, Bld: 128 mg/dL — ABNORMAL HIGH (ref 70–99)
Potassium: 4.1 mmol/L (ref 3.5–5.1)
Sodium: 134 mmol/L — ABNORMAL LOW (ref 135–145)

## 2024-04-27 LAB — MAGNESIUM: Magnesium: 1.4 mg/dL — ABNORMAL LOW (ref 1.7–2.4)

## 2024-04-27 LAB — PHOSPHORUS: Phosphorus: 2.1 mg/dL — ABNORMAL LOW (ref 2.5–4.6)

## 2024-04-27 MED ORDER — SIMVASTATIN 20 MG PO TABS
20.0000 mg | ORAL_TABLET | Freq: Every day | ORAL | Status: DC
  Administered 2024-04-27 – 2024-04-29 (×3): 20 mg via ORAL
  Filled 2024-04-27 (×3): qty 1

## 2024-04-27 MED ORDER — K PHOS MONO-SOD PHOS DI & MONO 155-852-130 MG PO TABS
500.0000 mg | ORAL_TABLET | Freq: Two times a day (BID) | ORAL | Status: AC
  Administered 2024-04-27 (×2): 500 mg via ORAL
  Filled 2024-04-27 (×2): qty 2

## 2024-04-27 MED ORDER — MAGNESIUM SULFATE 4 GM/100ML IV SOLN
4.0000 g | Freq: Once | INTRAVENOUS | Status: AC
  Administered 2024-04-27: 4 g via INTRAVENOUS
  Filled 2024-04-27: qty 100

## 2024-04-27 NOTE — Progress Notes (Signed)
 Nutrition Follow-up  DOCUMENTATION CODES:   Underweight, Severe malnutrition in context of chronic illness  INTERVENTION:   -Continue regular diet -Continue Boost Breeze po TID, each supplement provides 250 kcal and 9 grams of protein  -D/c 30 ml Prosource Plus BID, each supplement provides 100 kcals and 15 grams protein -Continue Magic cup BID with meals, each supplement provides 290 kcal and 9 grams of protein  -Continue MVI with minerals daily -Continue 1 mg folic acid  daily -Continue 100 mg thiamine  daily -500 mg vitamin C  BID -Continue 220 mg zinc  sulfate daily -RD will draw labs to assess for potential micronutrient deficiencies which may impede wound healing: vitamin A   NUTRITION DIAGNOSIS:   Severe Malnutrition related to chronic illness (COPD, ETOH abuse) as evidenced by moderate fat depletion, severe fat depletion, severe muscle depletion, percent weight loss.  Ongoing  GOAL:   Patient will meet greater than or equal to 90% of their needs  Progressing  MONITOR:   PO intake, Supplement acceptance  REASON FOR ASSESSMENT:   Consult Assessment of nutrition requirement/status  ASSESSMENT:   Pt with a history of alcohol abuse, COPD, hypertension, CAD who had a fall about 3 weeks back and since then has been taking ibuprofen twice a day presented with shortness of breath, fatigue and a history of tarry black stools.  9/13- s/p EGD- revealed normal esophagus and duodenum, large amount of food residue in stomach 9/15- s/p Procedure:  1.  Transmetatarsal amputation, right foot 2.  Transmetatarsal amputation, left foot  Reviewed I/O's: -635 ml x 24 hours and +2.3 L since admission  UOP: 875 ml x 24 hours  Pt sitting up in recliner chair, watching TV and eating lunch. Pt pleasant and in good spirits today. Pt looks better and is more interactive with this RD in comparison to prior visit. Pt shares that his appetite is good, he is working well with therapy, and I am  ready to get out of here.   Per TOC notes, plan for SNF placement at discharge; awaiting bed.   Wt has been stable over the past week.   Discussed importance of good meal and supplement intake to promote healing. Pt amenable to continue supplements. Reviewed plan of care with pt and encouraged continued good intake to help support healing and rehab. Pt expressed appreciation for visit.   Medications reviewed and include vitamin C , calcium -vitamin D, folic acid , protonix , thiamine , and zinc  sulfate.   Labs reviewed: Na: 134, Phos: 2.1 (on PO supplementation), Mg: 1.4 (on IV supplementation). CBGS: 118-174.  Diet Order:   Diet Order             Diet regular Fluid consistency: Thin  Diet effective now                   EDUCATION NEEDS:   Education needs have been addressed  Skin:  Skin Assessment: Skin Integrity Issues: Skin Integrity Issues:: Stage I, Incisions Stage I: coccyx Incisions: closed lt foot, closed rt foot  Last BM:  04/20/24 (type 3)  Height:   Ht Readings from Last 1 Encounters:  04/19/24 5' 9 (1.753 m)    Weight:   Wt Readings from Last 1 Encounters:  04/27/24 43.8 kg    Ideal Body Weight:  72.7 kg  BMI:  Body mass index is 14.26 kg/m.  Estimated Nutritional Needs:   Kcal:  1650-1850  Protein:  85-100 grams  Fluid:  1.6-1.8 L    Margery ORN, RD, LDN, CDCES Registered Dietitian III  Certified Diabetes Care and Education Specialist If unable to reach this RD, please use RD Inpatient group chat on secure chat between hours of 8am-4 pm daily

## 2024-04-27 NOTE — Progress Notes (Signed)
 Physical Therapy Treatment Patient Details Name: Bernard Harris MRN: [id number] DOB: October 14, 1960 Today's Date: 04/27/2024   History of Present Illness Pt is a 63 y.o. male presenting to hospital 04/15/24 with c/o weakness; c/o knee pain from fall 3 weeks prior.  Pt admitted with melena, NSAID induced gastropathy/alcoholic gastritis, acute on likely chronic blood loss anemia, hypotension, AKI, anion gap metabolic acidosis, electrolyte abnormality, cachexia.  S/p B TMA 04/19/24.  PMH includes F femoral head AVN, alcohol use disorder, CAD s/p stent, COPD (no home O2), htn, HLD, dyspnea with exertion, B foot surgery in 1980's.    PT Comments  Pt received in bed for overlapping session with OT to focus on individual goals and progress safe bed to chair transfer with slide board. Pt very compliant with B LE NWB restrictions throughout session. Good teach back demonstration of slide board transfer to bedside chair with CGA and cues for technique. Pt completed B LE strengthening exercises in sitting upon completion of PT session. Great tolerance for progressive mobility. Initial recs for STR once medically cleared for d/c remain appropriate.    If plan is discharge home, recommend the following: Two people to help with walking and/or transfers;A lot of help with bathing/dressing/bathroom;Assistance with cooking/housework;Assist for transportation;Help with stairs or ramp for entrance   Can travel by private vehicle     No  Equipment Recommendations  Other (comment) (YBD)    Recommendations for Other Services       Precautions / Restrictions Precautions Precautions: Fall Recall of Precautions/Restrictions: Impaired Restrictions Weight Bearing Restrictions Per Provider Order: Yes RLE Weight Bearing Per Provider Order: Non weight bearing LLE Weight Bearing Per Provider Order: Non weight bearing     Mobility  Bed Mobility Overal bed mobility: Needs Assistance Bed Mobility: Supine to Sit,  Rolling Rolling: Supervision, Used rails   Supine to sit: Supervision, Used rails     General bed mobility comments: improved ability to initiate/complete movement transition this date    Transfers Overall transfer level: Needs assistance Equipment used: Sliding board Transfers: Bed to chair/wheelchair/BSC            Lateral/Scoot Transfers: Contact guard assist General transfer comment:  (First attempt to slide board transfer to bedside chair with good understanding of technique and safety awareness. Pt also very cognisant of wt bearing restrictions during transfer)    Ambulation/Gait               General Gait Details: Not appropriate at this time (pt NWB'ing B LE's)   Stairs             Wheelchair Mobility     Tilt Bed    Modified Rankin (Stroke Patients Only)       Balance Overall balance assessment: Needs assistance Sitting-balance support: No upper extremity supported, Feet unsupported Sitting balance-Leahy Scale: Fair                                      Hotel manager: No apparent difficulties  Cognition Arousal: Alert Behavior During Therapy: WFL for tasks assessed/performed   PT - Cognitive impairments: No apparent impairments                         Following commands: Intact      Cueing Cueing Techniques: Verbal cues, Tactile cues, Visual cues  Exercises General Exercises - Lower Extremity Ankle Circles/Pumps: AROM, Both,  10 reps, Seated Long Arc Quad: AROM, Both, 10 reps, Seated Hip Flexion/Marching: AROM, Both, 10 reps, Seated Other Exercises Other Exercises:  (Pt educated on role of acute PT with good understanding, also good teach back from pt on wt bearing recautions)    General Comments General comments (skin integrity, edema, etc.):  (B feet with ace wraps intact, no drainage noted.)      Pertinent Vitals/Pain Pain Assessment Pain Assessment: No/denies pain     Home Living                          Prior Function            PT Goals (current goals can now be found in the care plan section) Acute Rehab PT Goals Patient Stated Goal: to improve pain and mobility Progress towards PT goals: Progressing toward goals    Frequency    Min 2X/week      PT Plan      Co-evaluation              AM-PAC PT 6 Clicks Mobility   Outcome Measure  Help needed turning from your back to your side while in a flat bed without using bedrails?: A Little Help needed moving from lying on your back to sitting on the side of a flat bed without using bedrails?: A Little Help needed moving to and from a bed to a chair (including a wheelchair)?: A Lot Help needed standing up from a chair using your arms (e.g., wheelchair or bedside chair)?: Total Help needed to walk in hospital room?: Total Help needed climbing 3-5 steps with a railing? : Total 6 Click Score: 11    End of Session   Activity Tolerance: Patient tolerated treatment well Patient left: in chair;with call bell/phone within reach;with chair alarm set Nurse Communication: Mobility status;Precautions;Weight bearing status;Other (comment) PT Visit Diagnosis: Other abnormalities of gait and mobility (R26.89);Muscle weakness (generalized) (M62.81);History of falling (Z91.81);Pain Pain - Right/Left: Right Pain - part of body: Ankle and joints of foot     Time: 0925-0950 PT Time Calculation (min) (ACUTE ONLY): 25 min  Charges:    $Therapeutic Activity: 8-22 mins PT General Charges $$ ACUTE PT VISIT: 1 Visit                    Darice Bohr, PTA  Darice JAYSON Bohr 04/27/2024, 12:10 PM

## 2024-04-27 NOTE — Plan of Care (Signed)
   Problem: Education: Goal: Knowledge of General Education information will improve Description: Including pain rating scale, medication(s)/side effects and non-pharmacologic comfort measures Outcome: Progressing   Problem: Clinical Measurements: Goal: Will remain free from infection Outcome: Progressing Goal: Diagnostic test results will improve Outcome: Progressing Goal: Respiratory complications will improve Outcome: Progressing

## 2024-04-27 NOTE — Consult Note (Addendum)
 PHARMACY CONSULT NOTE - ELECTROLYTES  Pharmacy Consult for Electrolyte Monitoring and Replacement   Recent Labs: Height: 5' 9 (175.3 cm) Weight: 43.8 kg (96 lb 9 oz) IBW/kg (Calculated) : 70.7 Estimated Creatinine Clearance: 57 mL/min (by C-G formula based on SCr of 0.69 mg/dL). Potassium (mmol/L)  Date Value  04/27/2024 4.1   Magnesium  (mg/dL)  Date Value  90/76/7974 1.4 (L)   Calcium  (mg/dL)  Date Value  90/76/7974 8.5 (L)   Albumin (g/dL)  Date Value  90/80/7974 2.3 (L)   Phosphorus (mg/dL)  Date Value  90/76/7974 2.1 (L)   Sodium (mmol/L)  Date Value  04/27/2024 134 (L)   Assessment  Bernard Harris is a 63 y.o. male presenting with melena and acute blood loss. PMH significant for COPD, tobacco use disorder, COPD, HTN, CAD with history of stent 2011. Noted patient with cachexia and severe protein caloric malnutrition. Pharmacy has been consulted to monitor and replace electrolytes.  Diet: regular diet MIVF: NS @ 50 mL/hr  Goal of Therapy: Electrolytes WNL  Plan:  --Mg 1.4, slow to improve >> magnesium  sulfate 4 g IV x 1 --Phos 2.1 >> K-Phos Neutral 500mg  PO with meals/supps for 2 doses --Check BMP, Mg, Phos with tomorrow AM labs  Thank you for allowing pharmacy to be a part of this patient's care.  Will M. Lenon, PharmD, BCPS Clinical Pharmacist 04/27/2024 8:13 AM

## 2024-04-27 NOTE — Progress Notes (Signed)
 Occupational Therapy Treatment Patient Details Name: Bernard Harris MRN: [id number] DOB: 1961-04-15 Today's Date: 04/27/2024   History of present illness Pt is a 63 y.o. male presenting to hospital 04/15/24 with c/o weakness; c/o knee pain from fall 3 weeks prior.  Pt admitted with melena, NSAID induced gastropathy/alcoholic gastritis, acute on likely chronic blood loss anemia, hypotension, AKI, anion gap metabolic acidosis, electrolyte abnormality, cachexia.  S/p B TMA 04/19/24.  PMH includes F femoral head AVN, alcohol use disorder, CAD s/p stent, COPD (no home O2), htn, HLD, dyspnea with exertion, B foot surgery in 1980's.   OT comments  Bernard Harris was seen for OT treatment on this date, overlapping for PT for safe mobility. Upon arrival to room pt in bed, agreeable to tx. Pt requires MOD A don pants at bed level rolling side to side to maintain B NWBing pcns, MIN A don shirt in sitting. SUP for bed mobility. CGA + MIN cues for bed>chair slideboard lateral scoot t/f. Left in chair with PT and RN in room. Pt making good progress toward goals, will continue to follow POC. Discharge recommendation remains appropriate.        If plan is discharge home, recommend the following:  A lot of help with walking and/or transfers;A lot of help with bathing/dressing/bathroom;Direct supervision/assist for medications management;Direct supervision/assist for financial management;Assistance with cooking/housework;Help with stairs or ramp for entrance   Equipment Recommendations  Other (comment) (defer)    Recommendations for Other Services      Precautions / Restrictions Precautions Precautions: Fall Recall of Precautions/Restrictions: Impaired Restrictions Weight Bearing Restrictions Per Provider Order: Yes RLE Weight Bearing Per Provider Order: Non weight bearing LLE Weight Bearing Per Provider Order: Non weight bearing       Mobility Bed Mobility Overal bed mobility: Needs Assistance Bed  Mobility: Supine to Sit, Rolling Rolling: Supervision   Supine to sit: Supervision          Transfers Overall transfer level: Needs assistance Equipment used: Sliding board Transfers: Bed to chair/wheelchair/BSC            Lateral/Scoot Transfers: Contact guard assist       Balance Overall balance assessment: Needs assistance Sitting-balance support: No upper extremity supported, Feet unsupported Sitting balance-Leahy Scale: Fair                                     ADL either performed or assessed with clinical judgement   ADL Overall ADL's : Needs assistance/impaired                                       General ADL Comments: MOD A don pants at bed level rolling side to side to maintain B NWBing pcns, MIN A don shirt in sitting. CGA + MIN cues for bed>chair slideboard lateral scoot t/f     Communication Communication Communication: No apparent difficulties   Cognition Arousal: Alert Behavior During Therapy: WFL for tasks assessed/performed Cognition: No family/caregiver present to determine baseline                               Following commands: Intact                      Pertinent Vitals/ Pain  Pain Assessment Pain Assessment: No/denies pain   Frequency  Min 2X/week        Progress Toward Goals  OT Goals(current goals can now be found in the care plan section)  Progress towards OT goals: Progressing toward goals  Acute Rehab OT Goals OT Goal Formulation: With patient Time For Goal Achievement: 05/04/24 Potential to Achieve Goals: Good ADL Goals Pt Will Perform Grooming: with set-up;sitting Pt Will Perform Lower Body Dressing: with min assist;sitting/lateral leans Pt Will Transfer to Toilet: with mod assist;with transfer board Pt Will Perform Toileting - Clothing Manipulation and hygiene: with min assist;sitting/lateral leans  Plan      Co-evaluation                  AM-PAC OT 6 Clicks Daily Activity     Outcome Measure   Help from another person eating meals?: None Help from another person taking care of personal grooming?: A Little Help from another person toileting, which includes using toliet, bedpan, or urinal?: A Lot Help from another person bathing (including washing, rinsing, drying)?: A Lot Help from another person to put on and taking off regular upper body clothing?: A Little Help from another person to put on and taking off regular lower body clothing?: A Lot 6 Click Score: 16    End of Session    OT Visit Diagnosis: Other abnormalities of gait and mobility (R26.89);Muscle weakness (generalized) (M62.81)   Activity Tolerance Patient tolerated treatment well   Patient Left in chair;with call bell/phone within reach;with nursing/sitter in room   Nurse Communication Mobility status        Time: 9074-9064 OT Time Calculation (min): 10 min  Charges: OT General Charges $OT Visit: 1 Visit OT Treatments $Self Care/Home Management : 8-22 mins  Elston Slot, M.S. OTR/L  04/27/24, 9:49 AM  ascom [phone]

## 2024-04-27 NOTE — Progress Notes (Signed)
 Progress Note   Patient: Bernard Harris FMW:[id number] DOB: 03/07/1961 DOA: 04/15/2024     11 DOS: the patient was seen and examined on 04/27/2024      Brief hospital course: From HPI Bernard Harris is a 63 y.o. male with medical history significant for COPD, tobacco use disorder, COPD, HTN, CAD with history of stent 2011, being admitted with melena and acute blood loss anemia(Hb 5.4) as well as severe AKI(creatinine 6.44) believe NSAID related.  Patient presented with 2 weeks of fatigue and worsening dyspnea on exertion.  He has noted black stool for the past week.  Reports he has been having knee pain from a fall 3 weeks ago and has been using ibuprofen up to 7 tablets twice a day based on what a friend told him his doctor prescribed for similar problem. Endorses drinking anywhere from 1-5 drinks of vodka daily but has not done so recently due to the weakness(states half gallon of vodka lasts 2 to 3 weeks) In the ED hemoglobin of 5.4, with most recent of 7.8 back in April 2025 Other hospital course as noted below     Assessment and Plan: Melena NSAID induced gastropathy/alcoholic gastritis Acute on likely chronic blood loss anemia  Hypotension secondary to hypovolemia/acute blood loss On presentation patient had hemoglobin of 5.4, with most recent of 7.8 back in April 2025. S/p 3 units of blood transfusion Continue Protonix  , Octreotide  discontinued Patient underwent EGD by GI which did not show acute bleeding, EGD was also limited as patient had lot of food intake at. Gastroenterologist planning outpatient follow-up with repeat EGD  Monitor Hb, stable. No evidence of melena or active bleeding    AKI (acute kidney injury) - resolved Anion gap metabolic acidosis Suspect NSAID induced nephropathy Suspect ATN/(?interstitial nephritis) related to NSAIDs in combination with hypovolemia/hypotension from blood loss CT chest abdomen and pelvis with no concerning findings and  kidneys/urinary system S/p Iv fluids Renal function currently back to baseline and nephrologist have signed off   Electrolyte abnormality Hypokalemia, Severe hypomagnesemia Hypophosphatemia Likely related to hypovolemia, hypotension, alcohol use, possible refeeding syndrome Monitor and replete as needed, as per pharmacy  Hypocalcemia - on p.o. calcium  supplements - asymptomatic   CAD S/P percutaneous coronary angioplasty Elevated troponin Troponin elevated in the 200s but downtrending, likely supply/demand mismatch Will hold aspirin due to bleeding Hold metoprolol, lisinopril, nitro due to hypotension Echocardiogram showing EF 40 to 45% with normal diastolic parameter Previous hospitalist discussed with Dr. Darron and patient follows up with Dr. 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  Medically ready TOC working on placement   Physical  Exam: Vitals and nursing note reviewed.  Constitutional:      General: He is awake. He is not in acute distress.     Appearance: He is cachectic.  HENT:     Head: Normocephalic and atraumatic.  Cardiovascular:     Rate and Rhythm: Normal rate and regular rhythm.     Heart sounds: Normal heart sounds.  Pulmonary:     Effort: Pulmonary effort is normal.     Breath sounds: Normal breath sounds.  Abdominal:     Palpations: Abdomen is soft.     Tenderness: There is no abdominal tenderness.  Neurological:     Mental Status: He is alert. Mental status is at baseline.  Musculoskeletal: Dressing noted to bilateral foot    Data Reviewed:  Vitals:   04/27/24 0422 04/27/24 0500 04/27/24 0817 04/27/24 1430  BP: 133/87  113/80 105/72  Pulse: 89  98 88  Resp: 18  16 16   Temp: 98.2 F (36.8 C)  98.4 F (36.9 C) 98.2 F (36.8 C)  TempSrc:    Oral  SpO2: 100%  100% 100%  Weight:  43.8 kg    Height:        Data Reviewed:    Latest Ref Rng & Units 04/25/2024    4:52 AM 04/24/2024    4:33 AM 04/23/2024    3:59 AM  CBC  WBC 4.0 - 10.5 K/uL 11.4     Hemoglobin 13.0 - 17.0 g/dL 9.4  9.1  9.1   Hematocrit 39.0 - 52.0 % 28.8     Platelets 150 - 400 K/uL 187          Latest Ref Rng & Units 04/27/2024    4:58 AM 04/26/2024    3:50 AM 04/25/2024    4:52 AM  BMP  Glucose 70 - 99 mg/dL 871  872  863   BUN 8 - 23 mg/dL 11  8  8    Creatinine 0.61 - 1.24 mg/dL 9.30  9.42  9.25   Sodium 135 - 145 mmol/L 134  135  135   Potassium 3.5 - 5.1 mmol/L 4.1  4.3  4.7   Chloride 98 - 111 mmol/L 103  106  106   CO2 22 - 32 mmol/L 23  22  18    Calcium  8.9 - 10.3 mg/dL 8.5  8.0  7.7       Author: Laree Lock, MD 04/27/2024 5:40 PM  For on call review [email or web address].

## 2024-04-27 NOTE — Plan of Care (Signed)

## 2024-04-27 NOTE — TOC Progression Note (Signed)
 Transition of Care [facility]) - Progression Note    Patient Details  Name: Bernard Harris MRN: [id number] Date of Birth: 01-07-1961  Transition of Care [facility]) CM/SW Contact  Alvaro Louder, [place] Phone Number: 04/27/2024, 4:15 PM  Clinical Narrative:   [facility] currently pending for patient to admit to [facility]. Patient is medically stable and has a bed available when [facility] is decided upon.   TOC to follow for discharge                      Expected Discharge Plan and Services                                               Social Drivers of Health (SDOH) Interventions SDOH Screenings   Food Insecurity: Food Insecurity Present (04/17/2024)  Housing: Low Risk  (04/17/2024)  Transportation Needs: No Transportation Needs (04/17/2024)  Utilities: Not At Risk (04/17/2024)  Financial Resource Strain: Patient Declined (11/04/2023)   Received from [facility]  Social Connections: Moderately Integrated (04/17/2024)  Tobacco Use: High Risk (04/19/2024)    Readmission Risk Interventions     No data to display

## 2024-04-28 LAB — HEMOGLOBIN: Hemoglobin: 9.5 g/dL — ABNORMAL LOW (ref 13.0–17.0)

## 2024-04-28 LAB — BASIC METABOLIC PANEL WITH GFR
Anion gap: 7 (ref 5–15)
BUN: 16 mg/dL (ref 8–23)
CO2: 24 mmol/L (ref 22–32)
Calcium: 8.4 mg/dL — ABNORMAL LOW (ref 8.9–10.3)
Chloride: 101 mmol/L (ref 98–111)
Creatinine, Ser: 0.73 mg/dL (ref 0.61–1.24)
GFR, Estimated: >60 mL/min (ref >60)
Glucose, Bld: 131 mg/dL — ABNORMAL HIGH (ref 70–99)
Potassium: 4.3 mmol/L (ref 3.5–5.1)
Sodium: 132 mmol/L — ABNORMAL LOW (ref 135–145)

## 2024-04-28 LAB — PHOSPHORUS: Phosphorus: 2.9 mg/dL (ref 2.5–4.6)

## 2024-04-28 LAB — GLUCOSE, CAPILLARY: Glucose-Capillary: 143 mg/dL — ABNORMAL HIGH (ref 70–99)

## 2024-04-28 LAB — MAGNESIUM: Magnesium: 1.9 mg/dL (ref 1.7–2.4)

## 2024-04-28 NOTE — Plan of Care (Signed)

## 2024-04-28 NOTE — Progress Notes (Signed)
 PROGRESS NOTE    Bernard Harris [facility]  FMW:[id number] DOB: 1961-03-01 DOA: 04/15/2024 PCP: Glover Lenis, MD  145A/145A-AA  LOS: 12 days   Brief hospital course:   Assessment & Plan: From HPI Eilam Shrewsbury is a 63 y.o. male with medical history significant for COPD, tobacco use disorder, COPD, HTN, CAD with history of stent 2011, being admitted with melena and acute blood loss anemia(Hb 5.4) as well as severe AKI(creatinine 6.44) believe NSAID related.  Patient presented with 2 weeks of fatigue and worsening dyspnea on exertion.  He has noted black stool for the past week.  Reports he has been having knee pain from a fall 3 weeks ago and has been using ibuprofen up to 7 tablets twice a day based on what a friend told him his doctor prescribed for similar problem. Endorses drinking anywhere from 1-5 drinks of vodka daily but has not done so recently due to the weakness(states half gallon of vodka lasts 2 to 3 weeks) In the ED hemoglobin of 5.4, with most recent of 7.8 back in April 2025 Other hospital course as noted below   Melena NSAID induced gastropathy/alcoholic gastritis Acute on likely chronic blood loss anemia  Hypotension secondary to hypovolemia/acute blood loss On presentation patient had hemoglobin of 5.4, with most recent of 7.8 back in April 2025. S/p 3 units of blood transfusion Octreotide  discontinued Patient underwent EGD by GI which did not show acute bleeding, EGD was also limited as patient had lot of food intake at. --Gastroenterologist planning outpatient follow-up with repeat EGD  --cont PPI   AKI (acute kidney injury) - resolved Anion gap metabolic acidosis Suspect NSAID induced nephropathy Suspect ATN/(?interstitial nephritis) related to NSAIDs in combination with hypovolemia/hypotension from blood loss CT chest abdomen and pelvis with no concerning findings and kidneys/urinary system S/p Iv fluids Renal function currently back to baseline and  nephrologist have signed off   Electrolyte abnormality Hypokalemia, Severe hypomagnesemia Hypophosphatemia Likely related to hypovolemia, hypotension, alcohol use, possible refeeding syndrome Monitor and supplement as needed, as per pharmacy   Hypocalcemia - asymptomatic --cont Ca supplement   Hx of CAD S/P percutaneous coronary angioplasty --aspirin held due to bleeding --cont statin --Previous hospitalist discussed with Dr. Darron and patient follows up with Dr. 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   Objective: Vitals:   04/27/24 1430 04/27/24 2012 04/28/24 0507 04/28/24 0839  BP: 105/72 122/75  96/66  Pulse: 88 76  87  Resp: 16 16  15   Temp: 98.2 F (36.8 C) 98.4 F (36.9 C)  98.3 F (36.8 C)  TempSrc: Oral Oral  Oral  SpO2: 100%  100%  100%  Weight:   42.6 kg   Height:        Intake/Output Summary (Last 24 hours) at 04/28/2024 1921 Last data filed at 04/28/2024 1300 Gross per 24 hour  Intake 957 ml  Output 650  ml  Net 307 ml   Filed Weights   04/26/24 0500 04/27/24 0500 04/28/24 0507  Weight: 43.8 kg 43.8 kg 42.6 kg    Examination:   Constitutional: NAD, AAOx3 HEENT: conjunctivae and lids normal, EOMI CV: No cyanosis.   RESP: normal respiratory effort, on RA Extremities: both feet wrapped Neuro: II - XII grossly intact.   Psych: Normal mood and affect.  Appropriate judgement and reason   Data Reviewed: I have personally reviewed labs and imaging studies  Time spent: 35 minutes  Ellouise Haber, MD [facility] If 7PM-7AM, please contact night-coverage 04/28/2024, 7:21 PM

## 2024-04-28 NOTE — Consult Note (Signed)
 PHARMACY CONSULT NOTE - ELECTROLYTES  Pharmacy Consult for Electrolyte Monitoring and Replacement   Recent Labs: Height: 5' 9 (175.3 cm) Weight: 42.6 kg (93 lb 14.7 oz) IBW/kg (Calculated) : 70.7 Estimated Creatinine Clearance: 55.5 mL/min (by C-G formula based on SCr of 0.73 mg/dL). Potassium (mmol/L)  Date Value  04/28/2024 4.3   Magnesium  (mg/dL)  Date Value  90/75/7974 1.9   Calcium  (mg/dL)  Date Value  90/75/7974 8.4 (L)   Albumin (g/dL)  Date Value  90/80/7974 2.3 (L)   Phosphorus (mg/dL)  Date Value  90/75/7974 2.9   Sodium (mmol/L)  Date Value  04/28/2024 132 (L)   Assessment  Bernard Harris is a 63 y.o. male presenting with melena and acute blood loss. PMH significant for COPD, tobacco use disorder, COPD, HTN, CAD with history of stent 2011. Noted patient with cachexia and severe protein caloric malnutrition. Pharmacy has been consulted to monitor and replace electrolytes.  Diet: regular diet MIVF: NS @ 50 mL/hr  Goal of Therapy: Electrolytes WNL  Plan:  --No electrolyte replacement currently warranted --Check BMP, Mg, Phos with tomorrow AM labs  Thank you for allowing pharmacy to be a part of this patient's care.  Will M. Lenon, PharmD, BCPS Clinical Pharmacist 04/28/2024 9:28 AM

## 2024-04-28 NOTE — Plan of Care (Signed)
  Problem: Cardiac: Goal: Will achieve and/or maintain adequate cardiac output Outcome: Progressing   Problem: Physical Regulation: Goal: Complications related to the disease process, condition or treatment will be avoided or minimized Outcome: Not Progressing   Problem: Activity: Goal: Risk for activity intolerance will decrease Outcome: Not Progressing

## 2024-04-29 LAB — BASIC METABOLIC PANEL WITH GFR
Anion gap: 9 (ref 5–15)
BUN: 21 mg/dL (ref 8–23)
CO2: 23 mmol/L (ref 22–32)
Calcium: 8.6 mg/dL — ABNORMAL LOW (ref 8.9–10.3)
Chloride: 99 mmol/L (ref 98–111)
Creatinine, Ser: 0.76 mg/dL (ref 0.61–1.24)
GFR, Estimated: >60 mL/min (ref >60)
Glucose, Bld: 127 mg/dL — ABNORMAL HIGH (ref 70–99)
Potassium: 4.2 mmol/L (ref 3.5–5.1)
Sodium: 131 mmol/L — ABNORMAL LOW (ref 135–145)

## 2024-04-29 LAB — PHOSPHORUS: Phosphorus: 2.6 mg/dL (ref 2.5–4.6)

## 2024-04-29 LAB — MAGNESIUM: Magnesium: 1.5 mg/dL — ABNORMAL LOW (ref 1.7–2.4)

## 2024-04-29 LAB — VITAMIN A: Vitamin A (Retinoic Acid): 8 ug/dL — ABNORMAL LOW (ref 22.0–69.5)

## 2024-04-29 MED ORDER — OYSTER SHELL CALCIUM/D3 500-5 MG-MCG PO TABS: 2.0000 | ORAL_TABLET | Freq: Two times a day (BID) | ORAL | Status: AC

## 2024-04-29 MED ORDER — MAGNESIUM SULFATE 2 GM/50ML IV SOLN
2.0000 g | Freq: Once | INTRAVENOUS | Status: AC
  Administered 2024-04-29: 2 g via INTRAVENOUS
  Filled 2024-04-29: qty 50

## 2024-04-29 MED ORDER — ASCORBIC ACID 500 MG PO TABS: 500.0000 mg | ORAL_TABLET | Freq: Two times a day (BID) | ORAL | Status: AC

## 2024-04-29 MED ORDER — ZINC SULFATE 220 (50 ZN) MG PO CAPS: 220.0000 mg | ORAL_CAPSULE | Freq: Every day | ORAL | Status: AC

## 2024-04-29 MED ORDER — VITAMIN B-1 100 MG PO TABS: 100.0000 mg | ORAL_TABLET | Freq: Every day | ORAL | Status: AC

## 2024-04-29 MED ORDER — PANTOPRAZOLE SODIUM 40 MG PO TBEC: 40.0000 mg | DELAYED_RELEASE_TABLET | Freq: Two times a day (BID) | ORAL | Status: DC

## 2024-04-29 MED ORDER — NICOTINE 21 MG/24HR TD PT24: 21.0000 mg | MEDICATED_PATCH | Freq: Every day | TRANSDERMAL | Status: AC

## 2024-04-29 MED ORDER — FOLIC ACID 1 MG PO TABS: 1.0000 mg | ORAL_TABLET | Freq: Every day | ORAL | Status: AC

## 2024-04-29 MED ORDER — ADULT MULTIVITAMIN W/MINERALS CH: 1.0000 | ORAL_TABLET | Freq: Every day | ORAL | Status: AC

## 2024-04-29 NOTE — TOC Transition Note (Signed)
 Transition of Care [facility]) - Discharge Note   Patient Details  Name: Bernard Harris MRN: [id number] Date of Birth: 07/10/1961  [facility] CM/SW Contact:  Alvaro Louder, LCSW Phone Number: 04/29/2024, 11:07 AM   Clinical Narrative:     LCSWA received insurance approval for patient to admit to SNF WOM. LCSWA confirmed with MD that patient is stable for discharge. LCSWA notified the patient and they are in agreement with discharge. LCSWA confirmed bed is available at SNF. Transport arranged with [facility] for next available.  Number to call report: [phone] RM 210p   Final next level of care: Skilled Nursing Facility Barriers to Discharge: No Barriers Identified   Patient Goals and CMS Choice            Discharge Placement              Patient chooses bed at: [facility] Patient to be transferred to facility by: [facility] Name of family member notified: Self Patient and family notified of of transfer: 04/29/24  Discharge Plan and Services Additional resources added to the After Visit Summary for                                       Social Drivers of Health (SDOH) Interventions SDOH Screenings   Food Insecurity: Food Insecurity Present (04/17/2024)  Housing: Low Risk  (04/17/2024)  Transportation Needs: No Transportation Needs (04/17/2024)  Utilities: Not At Risk (04/17/2024)  Financial Resource Strain: Patient Declined (11/04/2023)   Received from [facility]  Social Connections: Moderately Integrated (04/17/2024)  Tobacco Use: High Risk (04/19/2024)     Readmission Risk Interventions     No data to display

## 2024-04-29 NOTE — Plan of Care (Signed)
  Problem: Education: Goal: Knowledge of condition and prescribed therapy will improve Outcome: Progressing   Problem: Education: Goal: Knowledge of General Education information will improve Description: Including pain rating scale, medication(s)/side effects and non-pharmacologic comfort measures Outcome: Progressing   Problem: Coping: Goal: Level of anxiety will decrease Outcome: Progressing   Problem: Pain Managment: Goal: General experience of comfort will improve and/or be controlled Outcome: Progressing   Problem: Safety: Goal: Ability to remain free from injury will improve Outcome: Progressing

## 2024-04-29 NOTE — Discharge Summary (Signed)
 Physician Discharge Summary   Rapheal Masso [facility]  male DOB: [birth year]  FMW:[id number]  PCP: Glover Lenis, MD  Admit date: 04/15/2024 Discharge date: 04/27/2024.  Pt physically left the hospital on 04/30/2024.  Admitted From: home Disposition:  SNF rehab CODE STATUS: Full code  Discharge Instructions     Discharge wound care:   Complete by: As directed    Wound care  Every 3 days     Change bilateral TMA dressing with xeroform on incision line, 4x4 gauze, kerlix ace wrap. [facility] Course:  For full details, please see H&P, progress notes, consult notes and ancillary notes.  Briefly,  Bernard Harris is a 63 y.o. male with medical history significant for COPD, tobacco use disorder, HTN, CAD with history of stent 2011, being admitted with melena and acute blood loss anemia (Hb 5.4) as well as severe AKI (creatinine 6.44) believe NSAID related.    Patient presented with 2 weeks of fatigue and worsening dyspnea on exertion.  He has noted black stool for the week PTA.  Reports he has been having knee pain from a fall 3 weeks ago and has been using ibuprofen up to 7 tablets twice a day based on what a friend told him his doctor prescribed for similar problem.  Endorses drinking anywhere from 1-5 drinks of vodka daily but has not done so recently due to the weakness (states half gallon of vodka lasts 2 to 3 weeks).   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 S/p 3 units of blood transfusion. Octreotide  discontinued. Patient underwent EGD by GI which did not show acute bleeding, EGD was also limited as patient had lot of food intake at. --Gastroenterologist planning outpatient follow-up with repeat EGD with Dr. Aundria --cont home PPI   AKI (acute kidney  injury) - resolved Anion gap metabolic acidosis Suspect NSAID induced nephropathy Suspect ATN (?interstitial nephritis) related to NSAIDs in combination with hypovolemia/hypotension from blood loss. CT chest abdomen and pelvis with no concerning findings with kidneys/urinary system.  Nephro consulted. S/p Iv fluids. --Cr 6.44 on presentation, improved to 0.76 prior to discharge.  Bilateral foot gangrene Patient underwent transmetatarsal amputation of bilateral foot by podiatrist on 04/19/2024 Leukocytosis improving, received Augmentin  5 days postop --Strict NWB x 3 weeks --dressing change per instructions above. --follow-up with podiatry outpatient in 2 weeks --SNF rehab  HTN, not currently active --BP wnl without antihypertensives. --d/c'ed home amlodipine, Lisinopril and Lopressor   Electrolyte abnormality Hypokalemia Severe hypomagnesemia Hypophosphatemia Likely related to hypovolemia, hypotension, alcohol use, possible refeeding syndrome Monitored and supplemented as needed, as per pharmacy   Hypocalcemia - asymptomatic --cont Ca supplement   Hx of CAD S/P percutaneous coronary angioplasty --cont statin and ASA --Previous hospitalist discussed with Dr. Darron and patient follows up with Dr. Nolen team and they recommend outpatient follow-up   Elevated troponin Troponin elevated in the 200s but downtrending, likely supply/demand mismatch   Cachexia (HCC) severe protein calorie malnutrition  Underweight, BMI 13.87 Related to alcohol use disorder --per dietician -Continue regular diet -Continue Boost Breeze po TID, each supplement provides 250 kcal and 9 grams of protein  -Continue Magic cup BID with meals, each supplement provides 290 kcal and 9 grams of protein  -Continue MVI with minerals daily -Continue 1 mg folic acid  daily -Continue 100  mg thiamine  daily -500 mg vitamin C  BID -Continue 220 mg zinc  sulfate daily   Tobacco use disorder Nicotine  patch    Alcohol use disorder  --cont folic acid  and thiamine    Cough Improved with trial of Flonase    Unless noted above, medications under STOP list are ones pt was not taking PTA.  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 The score is based on dignosis, age, lab data, medications, orders, and past utilization.   Low:  0-14.9   Medium: 15-21.9   High: 22-29.9   Extreme: 30 and above         Discharge Instructions:  Allergies as of 04/29/2024   No Known Allergies      Medication List     STOP taking these medications    amLODipine 5 MG tablet Commonly known as: NORVASC   lisinopril 20 MG tablet Commonly known as: ZESTRIL   metoprolol tartrate 50 MG tablet Commonly known as: LOPRESSOR   nitroGLYCERIN 0.4 MG SL tablet Commonly known as: NITROSTAT   oxyCODONE -acetaminophen  5-325 MG tablet Commonly known as: Percocet       TAKE these medications    acamprosate 333 MG tablet Commonly known as: CAMPRAL Take 666 mg by mouth 3 (three) times daily with meals.   ascorbic acid  500 MG tablet Commonly known as: VITAMIN C  Take 1 tablet (500 mg total) by mouth 2 (two) times daily.    aspirin EC 81 MG tablet Take 81 mg by mouth daily.   calcium -vitamin D 500-5 MG-MCG tablet Commonly known as: OSCAL WITH D Take 2 tablets by mouth 2 (two) times daily.   cetirizine 10 MG tablet Commonly known as: ZYRTEC Take 10 mg by mouth daily as needed for allergies.   cyanocobalamin  1000 MCG tablet Take by mouth.   Fish Oil 1200 MG Caps Take 1,200 mg by mouth daily.   fluticasone  50 MCG/ACT nasal spray Commonly known as: FLONASE  Place 2 sprays into both nostrils daily as needed for allergies.   folic acid  1 MG tablet Commonly known as: FOLVITE  Take 1 tablet (1 mg total) by mouth daily. Start taking on: April 30, 2024   magnesium  oxide 400 MG tablet Commonly known as: MAG-OX Take 400 mg by mouth daily.   multivitamin with minerals Tabs tablet Take 1 tablet by mouth daily. Start taking on: April 30, 2024   nicotine  21 mg/24hr patch Commonly known as: NICODERM CQ  - dosed in mg/24 hours Place 1 patch (21 mg total) onto the skin daily. Start taking on: April 30, 2024   omeprazole 20 MG capsule Commonly known as: PRILOSEC Take 20 mg by mouth daily.   simvastatin  20 MG tablet Commonly known as:  ZOCOR  Take 20 mg by mouth at bedtime.   thiamine  100 MG tablet Commonly known as: Vitamin B-1 Take 1 tablet (100 mg total) by mouth daily. Start taking on: April 30, 2024   zinc  sulfate (50mg  elemental zinc ) 220 (50 Zn) MG capsule Take 1 capsule (220 mg total) by mouth daily. Start taking on: April 30, 2024               Discharge Care Instructions  (From admission, onward)           Start     Ordered   04/29/24 0000  Discharge wound care:       Comments: Wound care  Every 3 days     Change bilateral TMA dressing with xeroform on incision line, 4x4 gauze, kerlix ace wrap. - -   04/29/24 9093             Contact information for follow-up providers     Glover Lenis, MD Follow up.   Specialty: Family Medicine Contact  information: [address] [place] [address] [phone]              Contact information for after-discharge care     Destination     [facility] .   Service: Skilled Nursing Contact information: [address] [place]  [phone]                     No Known Allergies   The results of significant diagnostics from this hospitalization (including imaging, microbiology, ancillary and laboratory) are listed below for reference.   Consultations:   Procedures/Studies: DG Foot 2 Views Left Result Date: 04/19/2024 CLINICAL DATA:  [id number] Post-operative state [id number] EXAM: LEFT FOOT - 2 VIEW COMPARISON:  04/18/2024 FINDINGS: Diffuse osteopenia. Postsurgical changes consistent with transmetatarsal amputation of the foot.Subtle fragmentation of the distal aspects of the fourth and fifth digits. Peripheral vascular atherosclerosis. No subcutaneous gas. Surgical skin staples. IMPRESSION: Expected postsurgical changes related to recent transmetatarsal amputation of the left foot. No radiopaque foreign body or retained surgical instrument. Electronically Signed   By: Rogelia Myers M.D.   On: 04/19/2024 13:35   DG Foot 2 Views Right Result Date: 04/19/2024 CLINICAL DATA:  Bilateral transmetatarsal amputation EXAM: RIGHT FOOT - 2 VIEW COMPARISON:  Right foot radiograph dated 04/17/2024 FINDINGS: Status post transmetatarsal amputation of the right foot. Surgical staples and a few foci of subcutaneous emphysema along the amputation site. Diffuse osteopenia and vascular calcifications. IMPRESSION: Status post transmetatarsal amputation of the right foot. Electronically Signed   By: Limin  Xu M.D.   On: 04/19/2024 13:33   CT FOOT RIGHT WO CONTRAST Result Date: 04/19/2024 CLINICAL DATA:  Chronic foot changes and fungal dystrophy of the nails of the bilateral feet. EXAM: CT OF THE RIGHT FOOT WITHOUT CONTRAST TECHNIQUE:  Multidetector CT imaging of the right foot was performed according to the standard protocol. Multiplanar CT image reconstructions were also generated. RADIATION DOSE REDUCTION: This exam was performed according to the departmental dose-optimization program which includes automated exposure control, adjustment of the mA and/or kV according to patient size and/or use of iterative reconstruction technique. COMPARISON:  Right foot radiographs dated 04/17/2024. FINDINGS: Bones/Joint/Cartilage Severely enlarged and thickened toenails. Soft tissue defect/ulceration along the medial plantar mid great toe extending to the underlying interphalangeal joint of the great toe with associated erosive changes at the base of the distal phalanx and the head of the proximal phalanx of  the great toe, concerning for osteomyelitis. Chronic appearing erosive changes at the level of the second dorsal interphalangeal joint, likely secondary to severe chronic overlying nail thickening. The fourth middle phalanx is not clearly visualized with evidence of severe nail thickening and enlargement at this level contributing to chronic remodeling/erosive changes of the fourth distal phalanx. Similarly the fifth distal and middle phalanges are not clearly visualized with severe nail thickening and enlargement at this level and erosive changes at the underlying distal fifth proximal phalanx. Plantar soft tissue ulceration overlying the base of the fifth metatarsal without evidence of acute osteolysis or erosive changes of the fifth metatarsal base. Diffusely decreased osseous mineralization. No evidence of acute fracture. Ligaments Suboptimally assessed by CT. Muscles and Tendons No appreciable acute abnormality. Soft tissues Soft tissue changes as described above. No loculated fluid collection. IMPRESSION: 1. Severely enlarged and thickened toenails. Soft tissue defect/ulceration along the medial plantar mid great toe extending to the underlying  interphalangeal joint with erosive changes of the base of the distal phalanx and the head of the proximal phalanx of the great toe, concerning for osteomyelitis. 2. Chronic appearing erosive changes at the level of the second dorsal interphalangeal joint, likely secondary to severe chronic overlying nail thickening. The fourth middle phalanx is not clearly visualized with evidence of severe nail thickening and enlargement at this level contributing to chronic remodeling/erosive changes of the fourth distal phalanx. Similarly the fifth distal and middle phalanges are not clearly visualized with severe nail thickening and enlargement at this level and erosive changes at the underlying distal fifth proximal phalanx. 3. Plantar soft tissue ulceration overlying the base of the fifth metatarsal without evidence of acute osteolysis or erosive changes of the fifth metatarsal base. Electronically Signed   By: Harrietta Sherry M.D.   On: 04/19/2024 09:17   CT FOOT LEFT WO CONTRAST Result Date: 04/19/2024 CLINICAL DATA:  Chronic foot changes and fungal dystrophy of the nails of the bilateral feet. EXAM: CT OF THE LEFT FOOT WITHOUT CONTRAST TECHNIQUE: Multidetector CT imaging of the left foot was performed according to the standard protocol. Multiplanar CT image reconstructions were also generated. RADIATION DOSE REDUCTION: This exam was performed according to the departmental dose-optimization program which includes automated exposure control, adjustment of the mA and/or kV according to patient size and/or use of iterative reconstruction technique. COMPARISON:  Left foot radiographs dated 04/17/2024. FINDINGS: Bones/Joint/Cartilage Severely enlarged and thickened toenails. Soft tissue defect/ulceration along the plantar aspect of the mid great toe tracts to the underlying interphalangeal joint with extensive erosive changes of the base of the distal phalanx of the great toe with areas of osteolysis, suspicious for  osteomyelitis. Cortical irregularity of the head of the distal phalanx of the great toe may reflect sequela of osteomyelitis. Erosive changes of the distal phalanx of the great toe are also noted. Erosive changes of the tip of the fourth distal phalanx. Plantar soft tissue ulceration overlying the fourth metatarsal head without evidence of acute osteolysis or erosive changes of the fourth metatarsal head. Diffusely decreased osseous mineralization. No evidence of acute fracture. Ligaments Suboptimally assessed by CT. Muscles and Tendons No appreciable acute abnormality. Soft tissues Soft tissue changes as described above. No loculated fluid collection. IMPRESSION: 1. Severely enlarged and thickened toenails. Soft tissue defect/ulceration along the plantar aspect of the mid great toe tracts to the underlying interphalangeal joint with extensive erosive changes of the base of the distal phalanx of the great toe, suspicious for osteomyelitis. Cortical irregularity of the head  of the distal phalanx of the great toe may reflect sequela of osteomyelitis. 2. Erosive changes of the tip of the fourth distal phalanx, osteomyelitis is not excluded. 3. Plantar soft tissue ulceration overlying the fourth metatarsal head without evidence of acute osteolysis or erosive changes of the fourth metatarsal head. Electronically Signed   By: Harrietta Sherry M.D.   On: 04/19/2024 09:03   US  ARTERIAL ABI (SCREENING LOWER EXTREMITY) Result Date: 04/17/2024 EXAM: ULTRASOUND OF THE ARTERIAL SYSTEM OF THE BILATERAL LOWER EXTREMITIES TECHNIQUE: Duplex ultrasound using B-mode/gray scaled imaging, Doppler spectral analysis and color flow Doppler was obtained of the bilateral lower extremities. COMPARISON: None. CLINICAL HISTORY: 89987 Gangrene (HCC) 10012. Gangrene (HCC) 10012. FINDINGS: at rest, right abi 1.24, left 1.0. Multiphasic waveforms recorded in distal calf arteries bilaterally. IMPRESSION: 1. Normal ABIs. Electronically signed by:  Dayne Hassell MD 04/17/2024 11:37 PM EDT RP Workstation: [id number]   DG Foot 2 Views Left Result Date: 04/17/2024 EXAM: 2 VIEW(S) XRAY OF THE LEFT FOOT 04/17/2024 11:53:34 AM COMPARISON: None available. CLINICAL HISTORY: Gangrene FINDINGS: BONES AND JOINTS: No acute fracture. Marked skin distortion about the first, fourth and fifth toes with suspected subcutaneous emphysema limiting assessment of the phalanges. Mild DJD at the first MTP and interphalangeal joints. Diffuse osteopenia. SOFT TISSUES: Scattered vascular calcifications. IMPRESSION: 1. Marked skin distortion about the first, fourth, and fifth toes with suspected subcutaneous emphysema, limiting assessment of the phalanges. 2. Mild DJD at the first MTP and interphalangeal joints. 3. Diffuse osteopenia. 4. Scattered vascular calcifications. Electronically signed by: Dayne Hassell MD 04/17/2024 03:44 PM EDT RP Workstation: [id number]   DG Foot 2 Views Right Result Date: 04/17/2024 EXAM: 2 VIEW(S) XRAY OF THE RIGHT FOOT 04/17/2024 11:53:34 AM COMPARISON: None available. CLINICAL HISTORY: Gangrene FINDINGS: BONES AND JOINTS: Erosive changes of the distal aspect of the first digit distal phalanx with surrounding subcutaneous emphysema concerning for infection with gas-forming organism. Erosion/amputation of mid and distal phalanges fourth and fifth toes. Diffuse osteopenia. Mild midfoot degenerative changes. SOFT TISSUES: Surrounding soft tissue swelling. Vascular calcifications. IMPRESSION: 1. Erosive changes of the distal aspect of the first digit distal phalanx with surrounding subcutaneous emphysema, concerning for infection with gas-forming organism. 2. Erosion/amputation of mid and distal phalanges of the fourth and fifth toes with surrounding soft tissue swelling. 3. Diffuse osteopenia. 4. Mild midfoot degenerative changes. 5. Vascular calcifications. Electronically signed by: Katheleen Faes MD 04/17/2024 03:41 PM EDT RP Workstation: [id number]    ECHOCARDIOGRAM COMPLETE Result Date: 04/16/2024    ECHOCARDIOGRAM REPORT   Patient Name:   RONON FERGER Date of Exam: 04/16/2024 Medical Rec #:  [id number]             Height:       69.0 in Accession #:    [id number]            Weight:       102.0 lb Date of Birth:  09/02/1960              BSA:          1.552 m Patient Age:    63 years              BP:           112/67 mmHg Patient Gender: M                     HR:           64 bpm. Exam Location:  [facility] Procedure: 2D Echo,  Cardiac Doppler and Color Doppler (Both Spectral and Color            Flow Doppler were utilized during procedure). Indications:     Elevated troponin  History:         Patient has no prior history of Echocardiogram examinations.                  Previous Myocardial Infarction, COPD, Signs/Symptoms:Dyspnea;                  Risk Factors:Hypertension.  Sonographer:     Christopher Furnace Referring Phys:  [id number] DELAYNE LULLA SOLIAN Diagnosing Phys: Marsa Dooms MD  Sonographer Comments: Image acquisition challenging due to patient body habitus. IMPRESSIONS  1. Left ventricular ejection fraction, by estimation, is 40 to 45%. The left ventricle has mildly decreased function. The left ventricle has no regional wall motion abnormalities. Left ventricular diastolic parameters were normal.  2. Right ventricular systolic function is normal. The right ventricular size is normal.  3. The mitral valve is normal in structure. Mild to moderate mitral valve regurgitation. No evidence of mitral stenosis.  4. The aortic valve is normal in structure. Aortic valve regurgitation is mild. No aortic stenosis is present.  5. The inferior vena cava is normal in size with greater than 50% respiratory variability, suggesting right atrial pressure of 3 mmHg. FINDINGS  Left Ventricle: Left ventricular ejection fraction, by estimation, is 40 to 45%. The left ventricle has mildly decreased function. The left ventricle has no regional wall motion abnormalities. Strain was  performed and the global longitudinal strain is indeterminate. The left ventricular internal cavity size was normal in size. There is no left ventricular hypertrophy. Left ventricular diastolic parameters were normal. Right Ventricle: The right ventricular size is normal. No increase in right ventricular wall thickness. Right ventricular systolic function is normal. Left Atrium: Left atrial size was normal in size. Right Atrium: Right atrial size was normal in size. Pericardium: There is no evidence of pericardial effusion. Mitral Valve: The mitral valve is normal in structure. Mild to moderate mitral valve regurgitation. No evidence of mitral valve stenosis. Tricuspid Valve: The tricuspid valve is normal in structure. Tricuspid valve regurgitation is mild . No evidence of tricuspid stenosis. Aortic Valve: The aortic valve is normal in structure. Aortic valve regurgitation is mild. No aortic stenosis is present. Aortic valve mean gradient measures 2.5 mmHg. Aortic valve peak gradient measures 4.6 mmHg. Aortic valve area, by VTI measures 1.57 cm. Pulmonic Valve: The pulmonic valve was normal in structure. Pulmonic valve regurgitation is not visualized. No evidence of pulmonic stenosis. Aorta: The aortic root is normal in size and structure. Venous: The inferior vena cava is normal in size with greater than 50% respiratory variability, suggesting right atrial pressure of 3 mmHg. IAS/Shunts: No atrial level shunt detected by color flow Doppler. Additional Comments: 3D was performed not requiring image post processing on an independent workstation and was abnormal.  LEFT VENTRICLE PLAX 2D LVIDd:         5.10 cm      Diastology LVIDs:         4.20 cm      LV e' medial:    9.57 cm/s LV PW:         1.00 cm      LV E/e' medial:  8.8 LV IVS:        1.00 cm      LV e' lateral:   14.00 cm/s LVOT diam:  2.20 cm      LV E/e' lateral: 6.0 LV SV:         32 LV SV Index:   21 LVOT Area:     3.80 cm  LV Volumes (MOD) LV vol d,  MOD A4C: 122.0 ml LV vol s, MOD A4C: 73.9 ml LV SV MOD A4C:     122.0 ml RIGHT VENTRICLE RV Basal diam:  3.70 cm RV Mid diam:    4.30 cm RV S prime:     9.36 cm/s TAPSE (M-mode): 2.2 cm LEFT ATRIUM             Index        RIGHT ATRIUM           Index LA diam:        3.30 cm 2.13 cm/m   RA Area:     13.40 cm LA Vol (A2C):   80.5 ml 51.87 ml/m  RA Volume:   33.70 ml  21.72 ml/m LA Vol (A4C):   43.3 ml 27.90 ml/m LA Biplane Vol: 58.8 ml 37.89 ml/m  AORTIC VALVE AV Area (Vmax):    1.47 cm AV Area (Vmean):   1.42 cm AV Area (VTI):     1.57 cm AV Vmax:           107.50 cm/s AV Vmean:          73.050 cm/s AV VTI:            0.206 m AV Peak Grad:      4.6 mmHg AV Mean Grad:      2.5 mmHg LVOT Vmax:         41.50 cm/s LVOT Vmean:        27.300 cm/s LVOT VTI:          0.085 m LVOT/AV VTI ratio: 0.41  AORTA Ao Root diam: 2.90 cm MITRAL VALVE               TRICUSPID VALVE MV Area (PHT): 3.53 cm    TR Peak grad:   44.6 mmHg MV Decel Time: 215 msec    TR Vmax:        334.00 cm/s MV E velocity: 84.40 cm/s MV A velocity: 56.10 cm/s  SHUNTS MV E/A ratio:  1.50        Systemic VTI:  0.08 m                            Systemic Diam: 2.20 cm Marsa Dooms MD Electronically signed by Marsa Dooms MD Signature Date/Time: 04/16/2024/12:57:44 PM    Final    US  RENAL Result Date: 04/16/2024 CLINICAL DATA:  [id number] Acute kidney failure (HCC) [id number] EXAM: RENAL / URINARY TRACT ULTRASOUND COMPLETE COMPARISON:  04/16/2024 CT of the chest, abdomen, and pelvis FINDINGS: Right Kidney: Renal measurements: 8.9 x 4.4 x 5.4 cm = volume: 110 mL.Normal echogenicity. No mass. No hydronephrosis or nephrolithiasis. Left Kidney: Renal measurements: 9.1 x 4.9 x 3.7 cm = volume: 85 mL. Normal echogenicity. No mass. No hydronephrosis or nephrolithiasis. Bladder: Appears normal for degree of bladder distention. Other: None. IMPRESSION: No hydronephrosis or nephrolithiasis. Electronically Signed   By: Rogelia Myers M.D.   On: 04/16/2024  12:26   CT CHEST ABDOMEN PELVIS WO CONTRAST Result Date: 04/16/2024 EXAM: CT CHEST, ABDOMEN AND PELVIS WITHOUT CONTRAST 04/16/2024 12:34:20 AM TECHNIQUE: CT of the chest, abdomen and pelvis was performed without the administration of intravenous contrast. Multiplanar reformatted  images are provided for review. Automated exposure control, iterative reconstruction, and/or weight based adjustment of the mA/kV was utilized to reduce the radiation dose to as low as reasonably achievable. COMPARISON: 09/08/2022 CLINICAL HISTORY: Eval for malignancy. Pt reports leg weakness for the past 2 weeks, pt reports he feels dehydrated but denies change in eating or drinking. Pt denies dysuria, cough congestion or fever. FINDINGS: CHEST: MEDIASTINUM AND LYMPH NODES: Heart and pericardium are unremarkable. The central airways are clear. No mediastinal, hilar or axillary lymphadenopathy. LUNGS AND PLEURA: No focal consolidation or pulmonary edema. No pleural effusion or pneumothorax. Emphysema. ABDOMEN AND PELVIS: LIVER: Hepatic steatosis. GALLBLADDER AND BILE DUCTS: Cholelithiasis. No evidence of acute cholecystitis. SPLEEN: No acute abnormality. PANCREAS: Pancreatic calcifications compatible with sequelae of chronic pancreatitis. ADRENAL GLANDS: No acute abnormality. KIDNEYS, URETERS AND BLADDER: No stones in the kidneys or ureters. No hydronephrosis. No perinephric or periureteral stranding. Urinary bladder is unremarkable. GI AND BOWEL: Stomach demonstrates no acute abnormality. There is no bowel obstruction. Diverticulosis without evidence of diverticulitis. Normal appendix. REPRODUCTIVE ORGANS: No acute abnormality. PERITONEUM AND RETROPERITONEUM: No ascites. No free air. VASCULATURE: Coronary artery and aortic atherosclerotic calcification. Aorta is normal in caliber. Low-density blood pool compatible with anemia. ABDOMINAL AND PELVIS LYMPH NODES: No lymphadenopathy. REPRODUCTIVE ORGANS: No acute abnormality. BONES AND SOFT  TISSUES: No acute osseous abnormality. No focal soft tissue abnormality. Bilateral femoral head AVN. IMPRESSION: 1. No acute findings. 2. Emphysema. 3. Hepatic steatosis. 4. Pancreatic calcifications compatible with sequelae of chronic pancreatitis. 5. Cholelithiasis without evidence of acute cholecystitis. 6. Diverticulosis without evidence of diverticulitis. 7. Bilateral femoral head AVN. Electronically signed by: Norman Gatlin MD 04/16/2024 12:54 AM EDT RP Workstation: [id number]      Labs: BNP (last 3 results) No results for input(s): BNP in the last 8760 hours. Basic Metabolic Panel: Recent Labs  Lab 04/25/24 0452 04/26/24 0350 04/27/24 0458 04/28/24 0514 04/29/24 0233  NA 135 135 134* 132* 131*  K 4.7 4.3 4.1 4.3 4.2  CL 106 106 103 101 99  CO2 18* 22 23 24 23   GLUCOSE 136* 127* 128* 131* 127*  BUN 8 8 11 16 21   CREATININE 0.74 0.57* 0.69 0.73 0.76  CALCIUM  7.7* 8.0* 8.5* 8.4* 8.6*  MG 1.3* 1.3* 1.4* 1.9 1.5*  PHOS 2.2* 2.8 2.1* 2.9 2.6   Liver Function Tests: Recent Labs  Lab 04/23/24 0359  ALBUMIN 2.3*   No results for input(s): LIPASE, AMYLASE in the last 168 hours. No results for input(s): AMMONIA in the last 168 hours. CBC: Recent Labs  Lab 04/23/24 0359 04/24/24 0433 04/25/24 0452 04/28/24 0514  WBC  --   --  11.4*  --   HGB 9.1* 9.1* 9.4* 9.5*  HCT  --   --  28.8*  --   MCV  --   --  85.5  --   PLT  --   --  187  --    Cardiac Enzymes: No results for input(s): CKTOTAL, CKMB, CKMBINDEX, TROPONINI in the last 168 hours. BNP: Invalid input(s): POCBNP CBG: Recent Labs  Lab 04/24/24 0448 04/25/24 0528 04/26/24 0534 04/27/24 0421 04/28/24 0504  GLUCAP 166* 137* 132* 118* 143*   D-Dimer No results for input(s): DDIMER in the last 72 hours. Hgb A1c No results for input(s): HGBA1C in the last 72 hours. Lipid Profile No results for input(s): CHOL, HDL, LDLCALC, TRIG, CHOLHDL, LDLDIRECT in the last 72  hours. Thyroid function studies No results for input(s): TSH, T4TOTAL, T3FREE, THYROIDAB in the last  72 hours.  Invalid input(s): FREET3 Anemia work up No results for input(s): VITAMINB12, FOLATE, FERRITIN, TIBC, IRON, RETICCTPCT in the last 72 hours. Urinalysis    Component Value Date/Time   COLORURINE YELLOW (A) 04/16/2024 1025   APPEARANCEUR CLEAR (A) 04/16/2024 1025   LABSPEC 1.009 04/16/2024 1025   PHURINE 6.0 04/16/2024 1025   GLUCOSEU NEGATIVE 04/16/2024 1025   HGBUR SMALL (A) 04/16/2024 1025   BILIRUBINUR NEGATIVE 04/16/2024 1025   KETONESUR NEGATIVE 04/16/2024 1025   PROTEINUR NEGATIVE 04/16/2024 1025   NITRITE NEGATIVE 04/16/2024 1025   LEUKOCYTESUR NEGATIVE 04/16/2024 1025   Sepsis Labs Recent Labs  Lab 04/25/24 0452  WBC 11.4*   Microbiology No results found for this or any previous visit (from the past 240 hours).    Total time spend on discharging this patient, including the last patient exam, discussing the hospital stay, instructions for ongoing care as it relates to all pertinent caregivers, as well as preparing the medical discharge records, prescriptions, and/or referrals as applicable, is 35 minutes.    Ellouise Haber, MD  [facility] 04/29/2024, 10:14 AM

## 2024-04-29 NOTE — Plan of Care (Signed)
  Problem: Education: Goal: Knowledge of condition and prescribed therapy will improve 04/29/2024 0942 by Joshua Andrez PARAS, LPN Outcome: Adequate for Discharge 04/29/2024 0803 by Joshua Andrez PARAS, LPN Outcome: Progressing   Problem: Cardiac: Goal: Will achieve and/or maintain adequate cardiac output 04/29/2024 0942 by Joshua Andrez PARAS, LPN Outcome: Adequate for Discharge 04/29/2024 0803 by Joshua Andrez PARAS, LPN Outcome: Progressing   Problem: Physical Regulation: Goal: Complications related to the disease process, condition or treatment will be avoided or minimized 04/29/2024 0942 by Joshua Andrez PARAS, LPN Outcome: Adequate for Discharge 04/29/2024 0803 by Joshua Andrez PARAS, LPN Outcome: Progressing   Problem: Education: Goal: Knowledge of General Education information will improve Description: Including pain rating scale, medication(s)/side effects and non-pharmacologic comfort measures 04/29/2024 0942 by Joshua Andrez PARAS, LPN Outcome: Adequate for Discharge 04/29/2024 0803 by Joshua Andrez PARAS, LPN Outcome: Progressing   Problem: Health Behavior/Discharge Planning: Goal: Ability to manage health-related needs will improve 04/29/2024 0942 by Joshua Andrez PARAS, LPN Outcome: Adequate for Discharge 04/29/2024 0803 by Joshua Andrez PARAS, LPN Outcome: Progressing   Problem: Clinical Measurements: Goal: Ability to maintain clinical measurements within normal limits will improve 04/29/2024 0942 by Joshua Andrez PARAS, LPN Outcome: Adequate for Discharge 04/29/2024 0803 by Joshua Andrez PARAS, LPN Outcome: Progressing Goal: Will remain free from infection Outcome: Adequate for Discharge Goal: Diagnostic test results will improve 04/29/2024 0942 by Joshua Andrez PARAS, LPN Outcome: Adequate for Discharge 04/29/2024 0803 by Joshua Andrez PARAS, LPN Outcome: Progressing Goal: Respiratory complications will improve 04/29/2024 0942 by Joshua Andrez PARAS, LPN Outcome: Adequate for Discharge 04/29/2024 0803 by Joshua Andrez PARAS, LPN Outcome:  Progressing Goal: Cardiovascular complication will be avoided 04/29/2024 0942 by Joshua Andrez PARAS, LPN Outcome: Adequate for Discharge 04/29/2024 0803 by Joshua Andrez PARAS, LPN Outcome: Progressing   Problem: Activity: Goal: Risk for activity intolerance will decrease 04/29/2024 0942 by Joshua Andrez PARAS, LPN Outcome: Adequate for Discharge 04/29/2024 0803 by Joshua Andrez PARAS, LPN Outcome: Progressing   Problem: Nutrition: Goal: Adequate nutrition will be maintained 04/29/2024 0942 by Joshua Andrez PARAS, LPN Outcome: Adequate for Discharge 04/29/2024 0803 by Joshua Andrez PARAS, LPN Outcome: Progressing   Problem: Coping: Goal: Level of anxiety will decrease 04/29/2024 0942 by Joshua Andrez PARAS, LPN Outcome: Adequate for Discharge 04/29/2024 0803 by Joshua Andrez PARAS, LPN Outcome: Progressing   Problem: Elimination: Goal: Will not experience complications related to bowel motility 04/29/2024 0942 by Joshua Andrez PARAS, LPN Outcome: Adequate for Discharge 04/29/2024 0803 by Joshua Andrez PARAS, LPN Outcome: Progressing Goal: Will not experience complications related to urinary retention 04/29/2024 0942 by Joshua Andrez PARAS, LPN Outcome: Adequate for Discharge 04/29/2024 0803 by Joshua Andrez PARAS, LPN Outcome: Progressing   Problem: Pain Managment: Goal: General experience of comfort will improve and/or be controlled 04/29/2024 0942 by Joshua Andrez PARAS, LPN Outcome: Adequate for Discharge 04/29/2024 0803 by Joshua Andrez PARAS, LPN Outcome: Progressing   Problem: Safety: Goal: Ability to remain free from injury will improve Outcome: Adequate for Discharge   Problem: Skin Integrity: Goal: Risk for impaired skin integrity will decrease Outcome: Adequate for Discharge

## 2024-04-29 NOTE — Plan of Care (Signed)
  Problem: Education: Goal: Knowledge of condition and prescribed therapy will improve Outcome: Progressing   Problem: Cardiac: Goal: Will achieve and/or maintain adequate cardiac output Outcome: Progressing   Problem: Physical Regulation: Goal: Complications related to the disease process, condition or treatment will be avoided or minimized Outcome: Progressing   Problem: Education: Goal: Knowledge of General Education information will improve Description: Including pain rating scale, medication(s)/side effects and non-pharmacologic comfort measures Outcome: Progressing   Problem: Health Behavior/Discharge Planning: Goal: Ability to manage health-related needs will improve Outcome: Progressing   Problem: Clinical Measurements: Goal: Ability to maintain clinical measurements within normal limits will improve Outcome: Progressing Goal: Diagnostic test results will improve Outcome: Progressing Goal: Respiratory complications will improve Outcome: Progressing Goal: Cardiovascular complication will be avoided Outcome: Progressing   Problem: Activity: Goal: Risk for activity intolerance will decrease Outcome: Progressing   Problem: Nutrition: Goal: Adequate nutrition will be maintained Outcome: Progressing   Problem: Coping: Goal: Level of anxiety will decrease Outcome: Progressing   Problem: Elimination: Goal: Will not experience complications related to bowel motility Outcome: Progressing Goal: Will not experience complications related to urinary retention Outcome: Progressing   Problem: Pain Managment: Goal: General experience of comfort will improve and/or be controlled Outcome: Progressing

## 2024-04-29 NOTE — Consult Note (Signed)
 PHARMACY CONSULT NOTE - ELECTROLYTES  Pharmacy Consult for Electrolyte Monitoring and Replacement   Recent Labs: Height: 5' 9 (175.3 cm) Weight: 42.6 kg (93 lb 14.7 oz) IBW/kg (Calculated) : 70.7 Estimated Creatinine Clearance: 55.5 mL/min (by C-G formula based on SCr of 0.76 mg/dL). Potassium (mmol/L)  Date Value  04/29/2024 4.2   Magnesium  (mg/dL)  Date Value  90/74/7974 1.5 (L)   Calcium  (mg/dL)  Date Value  90/74/7974 8.6 (L)   Albumin (g/dL)  Date Value  90/80/7974 2.3 (L)   Phosphorus (mg/dL)  Date Value  90/74/7974 2.6   Sodium (mmol/L)  Date Value  04/29/2024 131 (L)   Assessment  Bernard Harris is a 63 y.o. male presenting with melena and acute blood loss. PMH significant for COPD, tobacco use disorder, COPD, HTN, CAD with history of stent 2011. Noted patient with cachexia and severe protein caloric malnutrition. Pharmacy has been consulted to monitor and replace electrolytes.  Diet: regular diet MIVF: N/A  Goal of Therapy: Electrolytes WNL  Plan:  Mag 1.5: Give magnesium  sulfate 2 g IV x1 Check BMP, Mg with tomorrow AM labs  Thank you for involving pharmacy in this patient's care.   Damien Napoleon, PharmD Clinical Pharmacist 04/29/2024 7:23 AM

## 2024-04-29 NOTE — TOC Progression Note (Signed)
 Transition of Care [facility]) - Progression Note    Patient Details  Name: Bernard Harris MRN: [id number] Date of Birth: 27-Aug-1960  [facility] CM/SW Contact  Alvaro Louder, [place] Phone Number: 04/29/2024, 1:56 PM  Clinical Narrative:   Patient medically ready for transport to facility. [facility] indicated that they are pausing all admissions and only admitting patients currently at the hospital from their facility. [facility] spoke to patient and family at bedside and let them know of the delay. [facility] cancelled transport.   TOC to follow for discharge      Barriers to Discharge: No Barriers Identified               Expected Discharge Plan and Services         Expected Discharge Date: 04/29/24                                     Social Drivers of Health (SDOH) Interventions SDOH Screenings   Food Insecurity: Food Insecurity Present (04/17/2024)  Housing: Low Risk  (04/17/2024)  Transportation Needs: No Transportation Needs (04/17/2024)  Utilities: Not At Risk (04/17/2024)  Financial Resource Strain: Patient Declined (11/04/2023)   Received from [facility]  Social Connections: Moderately Integrated (04/17/2024)  Tobacco Use: High Risk (04/19/2024)    Readmission Risk Interventions     No data to display

## 2024-04-30 DIAGNOSIS — N179 Acute kidney failure, unspecified: Secondary | ICD-10-CM | POA: Diagnosis not present

## 2024-04-30 DIAGNOSIS — M6259 Muscle wasting and atrophy, not elsewhere classified, multiple sites: Secondary | ICD-10-CM | POA: Diagnosis not present

## 2024-04-30 DIAGNOSIS — K921 Melena: Secondary | ICD-10-CM | POA: Diagnosis not present

## 2024-04-30 DIAGNOSIS — Z4781 Encounter for orthopedic aftercare following surgical amputation: Secondary | ICD-10-CM | POA: Diagnosis not present

## 2024-04-30 DIAGNOSIS — E43 Unspecified severe protein-calorie malnutrition: Secondary | ICD-10-CM | POA: Diagnosis not present

## 2024-04-30 DIAGNOSIS — Z7401 Bed confinement status: Secondary | ICD-10-CM | POA: Diagnosis not present

## 2024-04-30 DIAGNOSIS — I96 Gangrene, not elsewhere classified: Secondary | ICD-10-CM | POA: Diagnosis not present

## 2024-04-30 LAB — BASIC METABOLIC PANEL WITH GFR
Anion gap: 9 (ref 5–15)
BUN: 20 mg/dL (ref 8–23)
CO2: 23 mmol/L (ref 22–32)
Calcium: 8.9 mg/dL (ref 8.9–10.3)
Chloride: 102 mmol/L (ref 98–111)
Creatinine, Ser: 0.8 mg/dL (ref 0.61–1.24)
GFR, Estimated: >60 mL/min (ref >60)
Glucose, Bld: 131 mg/dL — ABNORMAL HIGH (ref 70–99)
Potassium: 4.2 mmol/L (ref 3.5–5.1)
Sodium: 134 mmol/L — ABNORMAL LOW (ref 135–145)

## 2024-04-30 LAB — MAGNESIUM: Magnesium: 1.5 mg/dL — ABNORMAL LOW (ref 1.7–2.4)

## 2024-04-30 LAB — GLUCOSE, CAPILLARY: Glucose-Capillary: 129 mg/dL — ABNORMAL HIGH (ref 70–99)

## 2024-04-30 MED ORDER — MAGNESIUM SULFATE 4 GM/100ML IV SOLN
4.0000 g | Freq: Once | INTRAVENOUS | Status: DC
  Filled 2024-04-30: qty 100

## 2024-04-30 MED ORDER — MAGNESIUM OXIDE -MG SUPPLEMENT 400 (240 MG) MG PO TABS
400.0000 mg | ORAL_TABLET | Freq: Two times a day (BID) | ORAL | Status: DC
  Administered 2024-04-30: 400 mg via ORAL
  Filled 2024-04-30: qty 1

## 2024-04-30 NOTE — Progress Notes (Signed)
 Gave report to Dav[place], LPN at [facility].. Davetta, LPN acknowledged understanding. Patient waiting for transportation via EMS.

## 2024-04-30 NOTE — Progress Notes (Signed)
 PROGRESS NOTE    Jumaane Weatherford [facility]  FMW:[id number] DOB: [birth year] DOA: 04/15/2024 PCP: Glover Lenis, MD  145A/145A-AA  LOS: 14 days   Brief hospital course:   Assessment & Plan: From HPI Frandy Basnett is a 63 y.o. male with medical history significant for COPD, tobacco use disorder, COPD, HTN, CAD with history of stent 2011, being admitted with melena and acute blood loss anemia(Hb 5.4) as well as severe AKI(creatinine 6.44) believe NSAID related.  Patient presented with 2 weeks of fatigue and worsening dyspnea on exertion.  He has noted black stool for the past week.  Reports he has been having knee pain from a fall 3 weeks ago and has been using ibuprofen up to 7 tablets twice a day based on what a friend told him his doctor prescribed for similar problem. Endorses drinking anywhere from 1-5 drinks of vodka daily but has not done so recently due to the weakness(states half gallon of vodka lasts 2 to 3 weeks) In the ED hemoglobin of 5.4, with most recent of 7.8 back in April 2025 Other hospital course as noted below   Melena NSAID induced gastropathy/alcoholic gastritis Acute on likely chronic blood loss anemia  Hypotension secondary to hypovolemia/acute blood loss On presentation patient had hemoglobin of 5.4, with most recent of 7.8 back in April 2025. S/p 3 units of blood transfusion Octreotide  discontinued Patient underwent EGD by GI which did not show acute bleeding, EGD was also limited as patient had lot of food intake at. --Gastroenterologist planning outpatient follow-up with repeat EGD  --cont PPI   AKI (acute kidney injury) - resolved Anion gap metabolic acidosis Suspect NSAID induced nephropathy Suspect ATN/(?interstitial nephritis) related to NSAIDs in combination with hypovolemia/hypotension from blood loss CT chest abdomen and pelvis with no concerning findings and kidneys/urinary system S/p Iv fluids Renal function currently back to baseline and  nephrologist have signed off   Electrolyte abnormality Hypokalemia, Severe hypomagnesemia Hypophosphatemia Likely related to hypovolemia, hypotension, alcohol use, possible refeeding syndrome Monitor and supplement as needed, as per pharmacy   Hypocalcemia - asymptomatic --cont Ca supplement   Hx of CAD S/P percutaneous coronary angioplasty --aspirin held due to bleeding --cont statin --Previous hospitalist discussed with Dr. Darron and patient follows up with Dr. 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  Pt had no complaint.  Having good oral intake.   Objective: Vitals:   04/29/24 2052 04/30/24 0428 04/30/24 0434 04/30/24 0809  BP: 131/85  (!) 110/98 100/71  Pulse: 78  86 96  Resp: 15  16 17   Temp: 97.7 F (36.5 C)  97.8 F (36.6 C) 98.2 F (36.8 C)  TempSrc:      SpO2: 100%  100% 100%  Weight:  42.2 kg    Height:       No intake or output data in the 24 hours ending 05/06/24  2006  Filed Weights   04/27/24 0500 04/28/24 0507 04/30/24 0428  Weight: 43.8 kg 42.6 kg 42.2 kg    Examination:   Constitutional: NAD, AAOx3 HEENT: conjunctivae and lids normal, EOMI CV: No cyanosis.   RESP: normal respiratory effort, on RA Neuro: II - XII grossly intact.     Data Reviewed: I have personally reviewed labs and imaging studies  Time spent: 25 minutes  Ellouise Haber, MD [facility] If 7PM-7AM, please contact night-coverage 05/06/2024, 8:06 PM

## 2024-04-30 NOTE — Plan of Care (Signed)
  Problem: Activity: Goal: Risk for activity intolerance will decrease 04/30/2024 1014 by Franceen Mattock D, LPN Outcome: Progressing 04/30/2024 1014 by Franceen Mattock D, LPN Reactivated   Problem: Nutrition: Goal: Adequate nutrition will be maintained 04/30/2024 1014 by Franceen Mattock D, LPN Outcome: Progressing 04/30/2024 1014 by Franceen Mattock D, LPN Reactivated   Problem: Pain Managment: Goal: General experience of comfort will improve and/or be controlled 04/30/2024 1014 by Franceen Mattock D, LPN Outcome: Progressing 04/30/2024 1014 by Franceen Mattock D, LPN Reactivated   Problem: Safety: Goal: Ability to remain free from injury will improve 04/30/2024 1014 by Franceen Mattock D, LPN Outcome: Progressing 04/30/2024 1014 by Franceen Mattock BIRCH, LPN Reactivated

## 2024-04-30 NOTE — Consult Note (Signed)
 PHARMACY CONSULT NOTE - ELECTROLYTES  Pharmacy Consult for Electrolyte Monitoring and Replacement   Recent Labs: Height: 5' 9 (175.3 cm) Weight: 42.2 kg (93 lb 0.6 oz) IBW/kg (Calculated) : 70.7 Estimated Creatinine Clearance: 54.9 mL/min (by C-G formula based on SCr of 0.8 mg/dL). Potassium (mmol/L)  Date Value  04/30/2024 4.2   Magnesium  (mg/dL)  Date Value  90/73/7974 1.5 (L)   Calcium  (mg/dL)  Date Value  90/73/7974 8.9   Albumin (g/dL)  Date Value  90/80/7974 2.3 (L)   Phosphorus (mg/dL)  Date Value  90/74/7974 2.6   Sodium (mmol/L)  Date Value  04/30/2024 134 (L)   Assessment  Bernard Harris is a 63 y.o. male presenting with melena and acute blood loss. PMH significant for COPD, tobacco use disorder, COPD, HTN, CAD with history of stent 2011. Noted patient with cachexia and severe protein caloric malnutrition. Pharmacy has been consulted to monitor and replace electrolytes.  Diet: regular diet MIVF: N/A  Goal of Therapy: Electrolytes WNL  Plan:  Mg 1.5 >> give magnesium  sulfate 4g IV x 1 Check BMP, Mg with tomorrow AM labs  Thank you for involving pharmacy in this patient's care.   Will M. Lenon, PharmD, BCPS Clinical Pharmacist 04/30/2024 7:46 AM

## 2024-05-03 DIAGNOSIS — D649 Anemia, unspecified: Secondary | ICD-10-CM | POA: Diagnosis not present

## 2024-05-03 DIAGNOSIS — T7840XA Allergy, unspecified, initial encounter: Secondary | ICD-10-CM | POA: Diagnosis not present

## 2024-05-03 DIAGNOSIS — K921 Melena: Secondary | ICD-10-CM | POA: Diagnosis not present

## 2024-05-03 DIAGNOSIS — F1020 Alcohol dependence, uncomplicated: Secondary | ICD-10-CM | POA: Diagnosis not present

## 2024-05-03 DIAGNOSIS — F17200 Nicotine dependence, unspecified, uncomplicated: Secondary | ICD-10-CM | POA: Diagnosis not present

## 2024-05-03 DIAGNOSIS — E569 Vitamin deficiency, unspecified: Secondary | ICD-10-CM | POA: Diagnosis not present

## 2024-05-03 DIAGNOSIS — I96 Gangrene, not elsewhere classified: Secondary | ICD-10-CM | POA: Diagnosis not present

## 2024-05-03 DIAGNOSIS — I2510 Atherosclerotic heart disease of native coronary artery without angina pectoris: Secondary | ICD-10-CM | POA: Diagnosis not present

## 2024-05-04 ENCOUNTER — Ambulatory Visit (INDEPENDENT_AMBULATORY_CARE_PROVIDER_SITE_OTHER): Admitting: Podiatry

## 2024-05-04 DIAGNOSIS — Z89432 Acquired absence of left foot: Secondary | ICD-10-CM

## 2024-05-04 DIAGNOSIS — Z89431 Acquired absence of right foot: Secondary | ICD-10-CM

## 2024-05-04 NOTE — Progress Notes (Signed)
 Subjective:  Patient ID: Bernard Harris, male    DOB: [birth year],  MRN: [id number]  Chief Complaint  Patient presents with   Routine Post Op    POST OP--DOS 9/15-bilateral TMA by Dr Malvin. Has not been weight bearing since surgery    DOS: 04/19/2024 Procedure: Bilateral transmetatarsal amputation primarily closed by Dr. Malvin  63 y.o. male returns for post-op check.  Patient states he is doing okay bilateral nonweightbearing with wheelchair bound.  No nausea fever chills vomiting.  Bandages clean dry and intact  Review of Systems: Negative except as noted in the HPI. Denies N/V/F/Ch.  Past Medical History:  Diagnosis Date   COPD (chronic obstructive pulmonary disease) (HCC)    Coronary artery disease    Current every day smoker    Dyspnea    with exertion   GERD (gastroesophageal reflux disease)    Heart attack (HCC)    X 2   Hyperlipidemia    Hypertension     Current Outpatient Medications:    acamprosate (CAMPRAL) 333 MG tablet, Take 666 mg by mouth 3 (three) times daily with meals., Disp: , Rfl:    ascorbic acid  (VITAMIN C ) 500 MG tablet, Take 1 tablet (500 mg total) by mouth 2 (two) times daily., Disp: , Rfl:    aspirin EC 81 MG tablet, Take 81 mg by mouth daily., Disp: , Rfl:    calcium -vitamin D (OSCAL WITH D) 500-5 MG-MCG tablet, Take 2 tablets by mouth 2 (two) times daily., Disp: , Rfl:    cetirizine (ZYRTEC) 10 MG tablet, Take 10 mg by mouth daily as needed for allergies., Disp: , Rfl:    cyanocobalamin  1000 MCG tablet, Take by mouth., Disp: , Rfl:    fluticasone  (FLONASE ) 50 MCG/ACT nasal spray, Place 2 sprays into both nostrils daily as needed for allergies., Disp: , Rfl:    folic acid  (FOLVITE ) 1 MG tablet, Take 1 tablet (1 mg total) by mouth daily., Disp: , Rfl:    magnesium  oxide (MAG-OX) 400 MG tablet, Take 400 mg by mouth daily., Disp: , Rfl:    Multiple Vitamin (MULTIVITAMIN WITH MINERALS) TABS tablet, Take 1 tablet by mouth daily., Disp: ,  Rfl:    nicotine  (NICODERM CQ  - DOSED IN MG/24 HOURS) 21 mg/24hr patch, Place 1 patch (21 mg total) onto the skin daily., Disp: , Rfl:    Omega-3 Fatty Acids (FISH OIL) 1200 MG CAPS, Take 1,200 mg by mouth daily. , Disp: , Rfl:    omeprazole (PRILOSEC) 20 MG capsule, Take 20 mg by mouth daily., Disp: , Rfl:    simvastatin  (ZOCOR ) 20 MG tablet, Take 20 mg by mouth at bedtime. , Disp: , Rfl:    thiamine  (VITAMIN B-1) 100 MG tablet, Take 1 tablet (100 mg total) by mouth daily., Disp: , Rfl:    zinc  sulfate, 50mg  elemental zinc , 220 (50 Zn) MG capsule, Take 1 capsule (220 mg total) by mouth daily., Disp: , Rfl:   Social History   Tobacco Use  Smoking Status Every Day   Current packs/day: 0.25   Average packs/day: 0.3 packs/day for 48.0 years (12.0 ttl pk-yrs)   Types: Cigarettes  Smokeless Tobacco Never    No Known Allergies Objective:  There were no vitals filed for this visit. There is no height or weight on file to calculate BMI. Constitutional Well developed. Well nourished.  Vascular Foot warm and well perfused. Capillary refill normal to all digits.   Neurologic Normal speech. Oriented to person, place, and time. Epicritic  sensation to light touch grossly present bilaterally.  Dermatologic Skin healing well without signs of infection. Skin edges well coapted without signs of infection.  Orthopedic: Tenderness to palpation noted about the surgical site.   Radiographs: None Assessment:   1. S/P transmetatarsal amputation of foot, right (HCC)   2. Status post transmetatarsal amputation of foot, left (HCC)    Plan:  Patient was evaluated and treated and all questions answered.  S/p foot surgery bilaterally -Progressing as expected post-operatively. -XR: See above -WB Status: Nonweightbearing in bilateral lower extremity in wheelchair -Sutures: Intact.  No clinical signs of dehiscence noted no complication noted. -Medications: None -Foot redressed.  No follow-ups on  file.

## 2024-05-05 DIAGNOSIS — I214 Non-ST elevation (NSTEMI) myocardial infarction: Secondary | ICD-10-CM | POA: Diagnosis not present

## 2024-05-05 DIAGNOSIS — Z720 Tobacco use: Secondary | ICD-10-CM | POA: Diagnosis not present

## 2024-05-05 DIAGNOSIS — E7800 Pure hypercholesterolemia, unspecified: Secondary | ICD-10-CM | POA: Diagnosis not present

## 2024-05-05 DIAGNOSIS — I10 Essential (primary) hypertension: Secondary | ICD-10-CM | POA: Diagnosis not present

## 2024-05-05 DIAGNOSIS — F1010 Alcohol abuse, uncomplicated: Secondary | ICD-10-CM | POA: Diagnosis not present

## 2024-05-05 DIAGNOSIS — Z955 Presence of coronary angioplasty implant and graft: Secondary | ICD-10-CM | POA: Diagnosis not present

## 2024-05-05 NOTE — Progress Notes (Signed)
 Telemedicine telephone encounter documentation  This telephone encounter was conducted with the patient's (or proxy's) verbal consent via secure, interactive audio telecommunications while in clinic/office/hospital.  The patient (or proxy) was instructed to have this encounter in a suitably private space and to only have persons present to whom they give permission to participate. In addition, patient identity was confirmed by use of name plus two identifiers.  Chief Complaint:   No chief complaint on file.   Subjective:   Bernard Harris is a 63 y.o. male established patient. Telephone visit today for: History of Present Illness  HPI     1.  Non STEMI, vision stent proximal RCA 10/16/2009             2.  Essential hypertension             3.  Hyperlipidemia             4.  COPD / ongoing tobacco abuse             5.  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 He was discharged to [facility]. Since discharge, he remains bedridden due to amputations. He reports that his breathing has improved. He denies chest pain. He denies palpitations. He reports that he his doing better, but his feet are understandably sore. He has not drank or smoked since admission, and he reports that he feels better.   Echocardiogram 04/16/2024 showed LVEF 40-45% with mild aortic regurgitation, mild to moderate mitral regurgitation.   He underwent stress echocardiogram 09/04/2016, exercised 6 minutes on a Bruce protocol without chest pain or ECG changes. At baseline 2D echocardiogram revealed LVEF 45%. At peak exercise, ejection fraction  was 50%, with evidence of inferior wall hypokinesis consistent with prior inferior non-STEMI and stent 10/16/2009.   He has hypertension, currently on amlodipine, lisinopril and metoprolol tartrate, which are well tolerated without apparent side effects.   He has hyperlipidemia on simvastatin .    Patient Active Problem List  Diagnosis  . Non-ST elevation myocardial infarction (NSTEMI) (CMS/HHS-HCC)  . HTN (hypertension)  . Tobacco abuse  . ETOH abuse  . Status post placement of bare metal coronary artery stent  . Hyperlipidemia  . Hypomagnesemia  . Gastroesophageal reflux disease without esophagitis    Outpatient Medications Prior to Visit  Medication Sig Dispense Refill  . albuterol 90 mcg/actuation inhaler Inhale 2 inhalations into the lungs every 6 (six) hours as needed for Wheezing.    SABRA amLODIPine (NORVASC) 5 MG tablet Take 1 tablet (5 mg total) by mouth once daily 100 tablet 1  . aspirin 81 MG EC tablet Take 81 mg by mouth once daily.    . cetirizine (ZYRTEC) 10 MG tablet Take 10 mg by mouth once daily.    . cyanocobalamin  (VITAMIN B12) 1000 MCG tablet Take 1,000 mcg by mouth once daily    . fluticasone  (FLONASE ) 50 mcg/actuation nasal spray Place 2 sprays into both nostrils once daily.    SABRA lisinopriL (ZESTRIL) 20 MG tablet Take 1 tablet (20 mg total) by mouth once daily 100 tablet 1  . magnesium  oxide (MAG-OX) 400 mg (241.3 mg magnesium ) tablet Take 400 mg by  mouth 2 (two) times daily    . metoprolol TARTrate (LOPRESSOR) 50 MG tablet Take 1 tablet (50 mg total) by mouth 2 (two) times daily 200 tablet 1  . nitroGLYcerin (NITROSTAT) 0.4 MG SL tablet Place 0.4 mg under the tongue every 5 (five) minutes as needed for Chest pain May take up to 3 doses.    SABRA omega-3 fatty acids-fish oil 360-1,200 mg Cap Take by mouth    . omeprazole (PRILOSEC) 20 MG DR capsule Take 1 capsule (20 mg total) by mouth once daily 100 capsule 1  . simvastatin  (ZOCOR ) 20 MG tablet Take 1 tablet (20 mg  total) by mouth at bedtime 100 tablet 1  . triamcinolone 0.1 % cream Apply topically 2 (two) times daily Apply to affected areas 2 times per day as need (may use for 7-10 days, then take 7 days off, may repeat as needed 30 g 0   No facility-administered medications prior to visit.    ROS   Objective:   Vitals as reported by patient: There were no vitals filed for this visit. There is no height or weight on file to calculate BMI. in no distress , Patient sounds ill, no audible wheezing, speaking in full sentences, voice sounds weak, but able to carry on conversation. Weakness of voice improves with continued talking.    Results    Assessment/Plan:   Assessment & Plan  Diagnoses and all orders for this visit:  Non-ST elevation myocardial infarction (NSTEMI) (CMS/HHS-HCC)  Primary hypertension  Status post placement of bare metal coronary artery stent  Pure hypercholesterolemia  Tobacco abuse  ETOH abuse  63 year old gentleman with known coronary artery disease, currently without chest pain. Previous stress echocardiogram revealed mildly reduced left ventricular function, mild exercise-induced hypokinesis consistent with patient's prior non-STEMI and coronary anatomy. The patient has mild chronic exertional dyspnea due to underlying COPD and ongoing tobacco abuse. He was recently admitted for severe blood loss anemia due to NSAID use, AKI, cachexia, gangrene, status post transmetatarsal amputation of bilateral feet, and discharged to [facility] for rehab. He has not been smoking or drinking and reports feeling better. Echo during admission similar to previous.   Plan: Continue current medications Continue simvastatin  Continue to abstain from alcohol and tobacco Return to clinic for follow up in 3 months.  This visit was coded based on medical decision making (MDM).        Future Appointments   This patient does not currently have any appointments scheduled.      There are no Patient Instructions on file for this visit.   An after visit summary was provided for the patient either in written format (printed) or through MyChart.  This note has been created using automated tools and reviewed for accuracy by [facility].     Attestation Statement:   I personally performed the service, non-incident to. (WP)   ANNA MARIA DRANE, PA-C

## 2024-05-18 ENCOUNTER — Ambulatory Visit: Admitting: Podiatry

## 2024-05-18 DIAGNOSIS — Z89431 Acquired absence of right foot: Secondary | ICD-10-CM

## 2024-05-18 NOTE — Progress Notes (Signed)
 Subjective:  Patient ID: Bernard Harris, male    DOB: 14-Nov-1960,  MRN: [id number]  Chief Complaint  Patient presents with   Routine Post Op    S/P transmetatarsal amputation of foot, right     DOS: 04/19/2024 Procedure: Bilateral transmetatarsal amputation primarily closed by Dr. Malvin  63 y.o. male returns for post-op check.  Patient states he is doing okay bilateral nonweightbearing with wheelchair bound.  No nausea fever chills vomiting.  Bandages clean dry and intact  Review of Systems: Negative except as noted in the HPI. Denies N/V/F/Ch.  Past Medical History:  Diagnosis Date   COPD (chronic obstructive pulmonary disease) (HCC)    Coronary artery disease    Current every day smoker    Dyspnea    with exertion   GERD (gastroesophageal reflux disease)    Heart attack (HCC)    X 2   Hyperlipidemia    Hypertension     Current Outpatient Medications:    acamprosate (CAMPRAL) 333 MG tablet, Take 666 mg by mouth 3 (three) times daily with meals., Disp: , Rfl:    ascorbic acid  (VITAMIN C ) 500 MG tablet, Take 1 tablet (500 mg total) by mouth 2 (two) times daily., Disp: , Rfl:    aspirin EC 81 MG tablet, Take 81 mg by mouth daily., Disp: , Rfl:    calcium -vitamin D (OSCAL WITH D) 500-5 MG-MCG tablet, Take 2 tablets by mouth 2 (two) times daily., Disp: , Rfl:    cetirizine (ZYRTEC) 10 MG tablet, Take 10 mg by mouth daily as needed for allergies., Disp: , Rfl:    cyanocobalamin  1000 MCG tablet, Take by mouth., Disp: , Rfl:    fluticasone  (FLONASE ) 50 MCG/ACT nasal spray, Place 2 sprays into both nostrils daily as needed for allergies., Disp: , Rfl:    folic acid  (FOLVITE ) 1 MG tablet, Take 1 tablet (1 mg total) by mouth daily., Disp: , Rfl:    magnesium  oxide (MAG-OX) 400 MG tablet, Take 400 mg by mouth daily., Disp: , Rfl:    Multiple Vitamin (MULTIVITAMIN WITH MINERALS) TABS tablet, Take 1 tablet by mouth daily., Disp: , Rfl:    nicotine  (NICODERM CQ  - DOSED IN MG/24  HOURS) 21 mg/24hr patch, Place 1 patch (21 mg total) onto the skin daily., Disp: , Rfl:    Omega-3 Fatty Acids (FISH OIL) 1200 MG CAPS, Take 1,200 mg by mouth daily. , Disp: , Rfl:    omeprazole (PRILOSEC) 20 MG capsule, Take 20 mg by mouth daily., Disp: , Rfl:    simvastatin  (ZOCOR ) 20 MG tablet, Take 20 mg by mouth at bedtime. , Disp: , Rfl:    thiamine  (VITAMIN B-1) 100 MG tablet, Take 1 tablet (100 mg total) by mouth daily., Disp: , Rfl:    zinc  sulfate, 50mg  elemental zinc , 220 (50 Zn) MG capsule, Take 1 capsule (220 mg total) by mouth daily., Disp: , Rfl:   Social History   Tobacco Use  Smoking Status Every Day   Current packs/day: 0.25   Average packs/day: 0.3 packs/day for 48.0 years (12.0 ttl pk-yrs)   Types: Cigarettes  Smokeless Tobacco Never    No Known Allergies Objective:  There were no vitals filed for this visit. There is no height or weight on file to calculate BMI. Constitutional Well developed. Well nourished.  Vascular Foot warm and well perfused. Capillary refill normal to all digits.   Neurologic Normal speech. Oriented to person, place, and time. Epicritic sensation to light touch grossly present bilaterally.  Dermatologic Bilateral transmetatarsal amputation wound dehiscence noted to the right side.  Skin completely epithelialized to the left side.  The wound does not probe down to deep tissue no purulent drainage noted.  No clinical signs of infection noted.  Orthopedic: Tenderness to palpation noted about the surgical site.   Radiographs: None Assessment:   1. S/P transmetatarsal amputation of foot, right (HCC)    Plan:  Patient was evaluated and treated and all questions answered.  Bilateral TMA wound dehiscence - All questions and concerns were discussed with the patient in extensive detail I discussed with the patient there are some superficial dehiscence noted to the right side.  The left side is mostly reepithelialized.  Encouraged him to do  Betadine wet-to-dry dressing changes daily to the right side - He would benefit from wound care center evaluation and management - The wound care center order was placed.  No follow-ups on file.

## 2024-05-26 DIAGNOSIS — F17211 Nicotine dependence, cigarettes, in remission: Secondary | ICD-10-CM | POA: Diagnosis not present

## 2024-05-26 DIAGNOSIS — R636 Underweight: Secondary | ICD-10-CM | POA: Diagnosis not present

## 2024-05-26 DIAGNOSIS — Z9181 History of falling: Secondary | ICD-10-CM | POA: Diagnosis not present

## 2024-05-26 DIAGNOSIS — Z008 Encounter for other general examination: Secondary | ICD-10-CM | POA: Diagnosis not present

## 2024-05-26 DIAGNOSIS — R269 Unspecified abnormalities of gait and mobility: Secondary | ICD-10-CM | POA: Diagnosis not present

## 2024-05-26 DIAGNOSIS — Z681 Body mass index (BMI) 19 or less, adult: Secondary | ICD-10-CM | POA: Diagnosis not present

## 2024-06-03 ENCOUNTER — Encounter: Payer: Self-pay | Admitting: Lab

## 2024-06-08 ENCOUNTER — Encounter: Payer: Self-pay | Admitting: Acute Care

## 2024-06-14 ENCOUNTER — Telehealth: Payer: Self-pay

## 2024-06-14 DIAGNOSIS — Z89431 Acquired absence of right foot: Secondary | ICD-10-CM

## 2024-06-14 NOTE — Telephone Encounter (Signed)
 Katheryn, RN w/[facility] called and left a message - she is asking for specific wound care orders Phone [phone]

## 2024-06-15 NOTE — Telephone Encounter (Signed)
 Spoke to Bernard Harris gave her verbal order she is also requesting physical therapy order patient is still unable to walk.

## 2024-06-16 ENCOUNTER — Encounter: Attending: Physician Assistant | Admitting: Physician Assistant

## 2024-06-16 DIAGNOSIS — E43 Unspecified severe protein-calorie malnutrition: Secondary | ICD-10-CM | POA: Insufficient documentation

## 2024-06-16 DIAGNOSIS — M86371 Chronic multifocal osteomyelitis, right ankle and foot: Secondary | ICD-10-CM | POA: Insufficient documentation

## 2024-06-16 DIAGNOSIS — N179 Acute kidney failure, unspecified: Secondary | ICD-10-CM | POA: Insufficient documentation

## 2024-06-16 DIAGNOSIS — R460 Very low level of personal hygiene: Secondary | ICD-10-CM | POA: Insufficient documentation

## 2024-06-16 DIAGNOSIS — F10188 Alcohol abuse with other alcohol-induced disorder: Secondary | ICD-10-CM | POA: Insufficient documentation

## 2024-06-16 DIAGNOSIS — I2510 Atherosclerotic heart disease of native coronary artery without angina pectoris: Secondary | ICD-10-CM | POA: Insufficient documentation

## 2024-06-16 DIAGNOSIS — L97512 Non-pressure chronic ulcer of other part of right foot with fat layer exposed: Secondary | ICD-10-CM | POA: Insufficient documentation

## 2024-06-16 DIAGNOSIS — I10 Essential (primary) hypertension: Secondary | ICD-10-CM | POA: Insufficient documentation

## 2024-06-16 DIAGNOSIS — L97522 Non-pressure chronic ulcer of other part of left foot with fat layer exposed: Secondary | ICD-10-CM | POA: Insufficient documentation

## 2024-06-21 NOTE — Addendum Note (Signed)
 Addended by: Thadius Smisek on: 06/21/2024 08:16 AM   Modules accepted: Orders

## 2024-06-23 ENCOUNTER — Encounter: Admitting: Physician Assistant

## 2024-07-07 ENCOUNTER — Encounter: Attending: Physician Assistant | Admitting: Physician Assistant

## 2024-07-07 DIAGNOSIS — Z681 Body mass index (BMI) 19 or less, adult: Secondary | ICD-10-CM | POA: Insufficient documentation

## 2024-07-07 DIAGNOSIS — I10 Essential (primary) hypertension: Secondary | ICD-10-CM | POA: Insufficient documentation

## 2024-07-07 DIAGNOSIS — L84 Corns and callosities: Secondary | ICD-10-CM | POA: Insufficient documentation

## 2024-07-07 DIAGNOSIS — T8781 Dehiscence of amputation stump: Secondary | ICD-10-CM | POA: Insufficient documentation

## 2024-07-07 DIAGNOSIS — Y835 Amputation of limb(s) as the cause of abnormal reaction of the patient, or of later complication, without mention of misadventure at the time of the procedure: Secondary | ICD-10-CM | POA: Insufficient documentation

## 2024-07-07 DIAGNOSIS — E43 Unspecified severe protein-calorie malnutrition: Secondary | ICD-10-CM | POA: Insufficient documentation

## 2024-07-07 DIAGNOSIS — L97522 Non-pressure chronic ulcer of other part of left foot with fat layer exposed: Secondary | ICD-10-CM | POA: Insufficient documentation

## 2024-07-07 DIAGNOSIS — N179 Acute kidney failure, unspecified: Secondary | ICD-10-CM | POA: Insufficient documentation

## 2024-07-07 DIAGNOSIS — Z8619 Personal history of other infectious and parasitic diseases: Secondary | ICD-10-CM | POA: Insufficient documentation

## 2024-07-07 DIAGNOSIS — I2510 Atherosclerotic heart disease of native coronary artery without angina pectoris: Secondary | ICD-10-CM | POA: Insufficient documentation

## 2024-07-07 DIAGNOSIS — F17210 Nicotine dependence, cigarettes, uncomplicated: Secondary | ICD-10-CM | POA: Insufficient documentation

## 2024-07-07 DIAGNOSIS — L97512 Non-pressure chronic ulcer of other part of right foot with fat layer exposed: Secondary | ICD-10-CM | POA: Insufficient documentation

## 2024-07-07 DIAGNOSIS — F1010 Alcohol abuse, uncomplicated: Secondary | ICD-10-CM | POA: Insufficient documentation

## 2024-07-21 ENCOUNTER — Encounter: Admitting: Internal Medicine

## 2024-07-21 DIAGNOSIS — F17210 Nicotine dependence, cigarettes, uncomplicated: Secondary | ICD-10-CM | POA: Diagnosis not present

## 2024-07-21 DIAGNOSIS — L97522 Non-pressure chronic ulcer of other part of left foot with fat layer exposed: Secondary | ICD-10-CM | POA: Diagnosis not present

## 2024-07-21 DIAGNOSIS — L84 Corns and callosities: Secondary | ICD-10-CM | POA: Diagnosis not present

## 2024-07-21 DIAGNOSIS — I2510 Atherosclerotic heart disease of native coronary artery without angina pectoris: Secondary | ICD-10-CM | POA: Diagnosis not present

## 2024-07-21 DIAGNOSIS — Z681 Body mass index (BMI) 19 or less, adult: Secondary | ICD-10-CM | POA: Diagnosis not present

## 2024-07-21 DIAGNOSIS — M86371 Chronic multifocal osteomyelitis, right ankle and foot: Secondary | ICD-10-CM | POA: Diagnosis present

## 2024-07-21 DIAGNOSIS — Y835 Amputation of limb(s) as the cause of abnormal reaction of the patient, or of later complication, without mention of misadventure at the time of the procedure: Secondary | ICD-10-CM | POA: Diagnosis not present

## 2024-07-21 DIAGNOSIS — T8781 Dehiscence of amputation stump: Secondary | ICD-10-CM | POA: Diagnosis not present

## 2024-07-21 DIAGNOSIS — N179 Acute kidney failure, unspecified: Secondary | ICD-10-CM | POA: Diagnosis not present

## 2024-07-21 DIAGNOSIS — Z8619 Personal history of other infectious and parasitic diseases: Secondary | ICD-10-CM | POA: Diagnosis not present

## 2024-07-21 DIAGNOSIS — F1010 Alcohol abuse, uncomplicated: Secondary | ICD-10-CM | POA: Diagnosis not present

## 2024-07-21 DIAGNOSIS — I10 Essential (primary) hypertension: Secondary | ICD-10-CM | POA: Diagnosis not present

## 2024-07-21 DIAGNOSIS — E43 Unspecified severe protein-calorie malnutrition: Secondary | ICD-10-CM | POA: Diagnosis not present

## 2024-07-21 DIAGNOSIS — L97512 Non-pressure chronic ulcer of other part of right foot with fat layer exposed: Secondary | ICD-10-CM | POA: Diagnosis not present

## 2024-07-28 ENCOUNTER — Ambulatory Visit
Admission: RE | Admit: 2024-07-28 | Discharge: 2024-07-28 | Disposition: A | Discharge status: Home / Self Care | Source: Ambulatory Visit | Attending: Acute Care | Admitting: Acute Care

## 2024-07-28 DIAGNOSIS — I700 Atherosclerosis of aorta: Secondary | ICD-10-CM | POA: Diagnosis not present

## 2024-07-28 DIAGNOSIS — Z87891 Personal history of nicotine dependence: Secondary | ICD-10-CM | POA: Insufficient documentation

## 2024-07-28 DIAGNOSIS — J439 Emphysema, unspecified: Secondary | ICD-10-CM | POA: Insufficient documentation

## 2024-07-28 DIAGNOSIS — R933 Abnormal findings on diagnostic imaging of other parts of digestive tract: Secondary | ICD-10-CM | POA: Diagnosis not present

## 2024-07-28 DIAGNOSIS — K760 Fatty (change of) liver, not elsewhere classified: Secondary | ICD-10-CM | POA: Diagnosis not present

## 2024-07-28 DIAGNOSIS — Z122 Encounter for screening for malignant neoplasm of respiratory organs: Secondary | ICD-10-CM | POA: Insufficient documentation

## 2024-07-28 DIAGNOSIS — K861 Other chronic pancreatitis: Secondary | ICD-10-CM | POA: Diagnosis not present

## 2024-07-28 DIAGNOSIS — F17210 Nicotine dependence, cigarettes, uncomplicated: Secondary | ICD-10-CM | POA: Insufficient documentation

## 2024-07-28 DIAGNOSIS — J479 Bronchiectasis, uncomplicated: Secondary | ICD-10-CM | POA: Diagnosis not present

## 2024-07-28 DIAGNOSIS — I2510 Atherosclerotic heart disease of native coronary artery without angina pectoris: Secondary | ICD-10-CM | POA: Diagnosis not present

## 2024-07-28 DIAGNOSIS — R911 Solitary pulmonary nodule: Secondary | ICD-10-CM | POA: Diagnosis not present

## 2024-08-03 ENCOUNTER — Encounter: Admitting: Physician Assistant

## 2024-08-03 DIAGNOSIS — T8781 Dehiscence of amputation stump: Secondary | ICD-10-CM | POA: Diagnosis not present

## 2024-08-09 ENCOUNTER — Telehealth: Payer: Self-pay | Admitting: Acute Care

## 2024-08-09 ENCOUNTER — Other Ambulatory Visit: Payer: Self-pay

## 2024-08-09 DIAGNOSIS — R911 Solitary pulmonary nodule: Secondary | ICD-10-CM

## 2024-08-09 DIAGNOSIS — Z122 Encounter for screening for malignant neoplasm of respiratory organs: Secondary | ICD-10-CM

## 2024-08-09 DIAGNOSIS — Z87891 Personal history of nicotine dependence: Secondary | ICD-10-CM

## 2024-08-09 DIAGNOSIS — F17210 Nicotine dependence, cigarettes, uncomplicated: Secondary | ICD-10-CM

## 2024-08-09 NOTE — Telephone Encounter (Signed)
 Spoke with patient and reviewed recent Lung CT results. He is in agreement to complete a 3 month follow up scan scheduled for 10/26/2024 at [facility]. Order placed. Results and plan to PCP.

## 2024-08-09 NOTE — Telephone Encounter (Signed)
 Results have been reviewed by Rut[place], NP. He will need a 3 month follow up scan to evaluate a new 6.2 mm nodule. Please call the patient and review results. Place order. Results and plan to PCP.

## 2024-08-09 NOTE — Telephone Encounter (Signed)
 Call report LDCT:  IMPRESSION: 1. New 6.2 mm posterolateral right lower lobe nodule. Lung-RADS 4A, suspicious. Follow up low-dose chest CT without contrast in 3 months (please use the following order, CT CHEST LCS NODULE FOLLOW-UP W/O CM) is recommended. Alternatively, PET may be considered when there is a solid component 8mm or larger. These results will be called to the ordering clinician or representative by the Radiologist Assistant, and communication documented in the PACS or [employer]. 2. Cylindrical bronchiectasis. 3. Hepatic steatosis. 4. Chronic calcific pancreatitis. 5. Gastric wall thickening. 6. Aortic atherosclerosis (ICD10-I70.0). Coronary artery calcification. 7.  Emphysema (ICD10-J43.9).     Electronically Signed   By: Newell Eke M.D.   On: 08/09/2024 12:04

## 2024-08-13 ENCOUNTER — Encounter: Attending: Physician Assistant | Admitting: Physician Assistant

## 2024-08-13 DIAGNOSIS — M86371 Chronic multifocal osteomyelitis, right ankle and foot: Secondary | ICD-10-CM | POA: Insufficient documentation

## 2024-08-13 DIAGNOSIS — F1010 Alcohol abuse, uncomplicated: Secondary | ICD-10-CM | POA: Insufficient documentation

## 2024-08-13 DIAGNOSIS — Z681 Body mass index (BMI) 19 or less, adult: Secondary | ICD-10-CM | POA: Insufficient documentation

## 2024-08-13 DIAGNOSIS — I10 Essential (primary) hypertension: Secondary | ICD-10-CM | POA: Insufficient documentation

## 2024-08-13 DIAGNOSIS — N179 Acute kidney failure, unspecified: Secondary | ICD-10-CM | POA: Insufficient documentation

## 2024-08-13 DIAGNOSIS — L97512 Non-pressure chronic ulcer of other part of right foot with fat layer exposed: Secondary | ICD-10-CM | POA: Insufficient documentation

## 2024-08-13 DIAGNOSIS — Z89431 Acquired absence of right foot: Secondary | ICD-10-CM | POA: Insufficient documentation

## 2024-08-13 DIAGNOSIS — Z89432 Acquired absence of left foot: Secondary | ICD-10-CM | POA: Insufficient documentation

## 2024-08-13 DIAGNOSIS — F17218 Nicotine dependence, cigarettes, with other nicotine-induced disorders: Secondary | ICD-10-CM | POA: Insufficient documentation

## 2024-08-13 DIAGNOSIS — I2510 Atherosclerotic heart disease of native coronary artery without angina pectoris: Secondary | ICD-10-CM | POA: Insufficient documentation

## 2024-08-13 DIAGNOSIS — E43 Unspecified severe protein-calorie malnutrition: Secondary | ICD-10-CM | POA: Insufficient documentation

## 2024-08-25 ENCOUNTER — Encounter: Admitting: Physician Assistant

## 2024-09-08 ENCOUNTER — Encounter: Admitting: Physician Assistant

## 2024-09-22 ENCOUNTER — Encounter: Admitting: Physician Assistant

## 2024-10-27 ENCOUNTER — Ambulatory Visit
